# Patient Record
Sex: Female | Born: 1990 | Race: White | Hispanic: No | Marital: Single | Smoking: Former smoker
Health system: Southern US, Community
[De-identification: ages and names within clinical notes are randomized; demographics above are authoritative.]

## PROBLEM LIST (undated history)

## (undated) DIAGNOSIS — J302 Other seasonal allergic rhinitis: Secondary | ICD-10-CM

## (undated) DIAGNOSIS — N760 Acute vaginitis: Secondary | ICD-10-CM

## (undated) DIAGNOSIS — A749 Chlamydial infection, unspecified: Secondary | ICD-10-CM

## (undated) DIAGNOSIS — N390 Urinary tract infection, site not specified: Secondary | ICD-10-CM

## (undated) DIAGNOSIS — U071 COVID-19: Secondary | ICD-10-CM

## (undated) DIAGNOSIS — B9689 Other specified bacterial agents as the cause of diseases classified elsewhere: Secondary | ICD-10-CM

## (undated) DIAGNOSIS — N200 Calculus of kidney: Secondary | ICD-10-CM

## (undated) DIAGNOSIS — Z8489 Family history of other specified conditions: Secondary | ICD-10-CM

## (undated) DIAGNOSIS — Z309 Encounter for contraceptive management, unspecified: Secondary | ICD-10-CM

## (undated) DIAGNOSIS — N930 Postcoital and contact bleeding: Principal | ICD-10-CM

## (undated) DIAGNOSIS — Z87442 Personal history of urinary calculi: Secondary | ICD-10-CM

## (undated) DIAGNOSIS — N898 Other specified noninflammatory disorders of vagina: Secondary | ICD-10-CM

## (undated) DIAGNOSIS — F419 Anxiety disorder, unspecified: Secondary | ICD-10-CM

## (undated) HISTORY — DX: Other specified noninflammatory disorders of vagina: N89.8

## (undated) HISTORY — DX: Urinary tract infection, site not specified: N39.0

## (undated) HISTORY — DX: Acute vaginitis: N76.0

## (undated) HISTORY — PX: NO PAST SURGERIES: SHX2092

## (undated) HISTORY — DX: Chlamydial infection, unspecified: A74.9

## (undated) HISTORY — DX: Encounter for contraceptive management, unspecified: Z30.9

## (undated) HISTORY — DX: Postcoital and contact bleeding: N93.0

## (undated) HISTORY — DX: Other specified bacterial agents as the cause of diseases classified elsewhere: B96.89

---
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
## 2010-01-05 ENCOUNTER — Emergency Department (HOSPITAL_COMMUNITY): Admission: EM | Admit: 2010-01-05 | Discharge: 2010-01-05 | Payer: Self-pay | Admitting: Emergency Medicine

## 2010-01-05 DIAGNOSIS — N200 Calculus of kidney: Secondary | ICD-10-CM

## 2010-01-05 HISTORY — DX: Calculus of kidney: N20.0

## 2010-09-20 LAB — DIFFERENTIAL
Basophils Absolute: 0 10*3/uL (ref 0.0–0.1)
Eosinophils Relative: 0 % (ref 0–5)
Lymphocytes Relative: 27 % (ref 12–46)
Lymphs Abs: 1.8 10*3/uL (ref 0.7–4.0)
Monocytes Absolute: 0.4 10*3/uL (ref 0.1–1.0)
Neutro Abs: 4.4 10*3/uL (ref 1.7–7.7)

## 2010-09-20 LAB — URINALYSIS, ROUTINE W REFLEX MICROSCOPIC
Leukocytes, UA: NEGATIVE
Nitrite: POSITIVE — AB
Urobilinogen, UA: 1 mg/dL (ref 0.0–1.0)

## 2010-09-20 LAB — CBC
HCT: 38.8 % (ref 36.0–46.0)
Hemoglobin: 13.5 g/dL (ref 12.0–15.0)
MCH: 31.5 pg (ref 26.0–34.0)
MCV: 90.9 fL (ref 78.0–100.0)
Platelets: 256 10*3/uL (ref 150–400)
RDW: 11.8 % (ref 11.5–15.5)

## 2010-09-20 LAB — URINE CULTURE: Colony Count: NO GROWTH

## 2010-09-20 LAB — URINE MICROSCOPIC-ADD ON

## 2010-09-20 LAB — BASIC METABOLIC PANEL
Calcium: 9.1 mg/dL (ref 8.4–10.5)
Chloride: 103 mEq/L (ref 96–112)
Creatinine, Ser: 0.8 mg/dL (ref 0.4–1.2)
Glucose, Bld: 98 mg/dL (ref 70–99)
Potassium: 3.1 mEq/L — ABNORMAL LOW (ref 3.5–5.1)

## 2011-06-24 ENCOUNTER — Emergency Department (HOSPITAL_COMMUNITY)
Admission: EM | Admit: 2011-06-24 | Discharge: 2011-06-25 | Disposition: A | Discharge status: Home / Self Care | Payer: 59 | Attending: Emergency Medicine | Admitting: Emergency Medicine

## 2011-06-24 DIAGNOSIS — R05 Cough: Secondary | ICD-10-CM | POA: Insufficient documentation

## 2011-06-24 DIAGNOSIS — M545 Low back pain, unspecified: Secondary | ICD-10-CM | POA: Insufficient documentation

## 2011-06-24 DIAGNOSIS — R6883 Chills (without fever): Secondary | ICD-10-CM | POA: Insufficient documentation

## 2011-06-24 DIAGNOSIS — R109 Unspecified abdominal pain: Secondary | ICD-10-CM | POA: Insufficient documentation

## 2011-06-24 DIAGNOSIS — R110 Nausea: Secondary | ICD-10-CM | POA: Insufficient documentation

## 2011-06-24 DIAGNOSIS — R300 Dysuria: Secondary | ICD-10-CM | POA: Insufficient documentation

## 2011-06-24 DIAGNOSIS — X58XXXA Exposure to other specified factors, initial encounter: Secondary | ICD-10-CM | POA: Insufficient documentation

## 2011-06-24 DIAGNOSIS — R10819 Abdominal tenderness, unspecified site: Secondary | ICD-10-CM | POA: Insufficient documentation

## 2011-06-24 DIAGNOSIS — T148XXA Other injury of unspecified body region, initial encounter: Secondary | ICD-10-CM

## 2011-06-24 DIAGNOSIS — R509 Fever, unspecified: Secondary | ICD-10-CM | POA: Insufficient documentation

## 2011-06-24 DIAGNOSIS — R059 Cough, unspecified: Secondary | ICD-10-CM | POA: Insufficient documentation

## 2011-06-24 LAB — URINALYSIS, ROUTINE W REFLEX MICROSCOPIC
Hgb urine dipstick: NEGATIVE
Nitrite: NEGATIVE

## 2011-06-24 LAB — PREGNANCY, URINE: Preg Test, Ur: NEGATIVE

## 2011-06-24 NOTE — ED Notes (Signed)
Pain began last Friday, started in right hip area, now having right flank pain and right lower back pain, also +painful urination, ?fever at times

## 2011-06-24 NOTE — ED Provider Notes (Signed)
History     CSN: [id number]  Arrival date & time 06/24/11  2317   First MD Initiated Contact with Patient 06/24/11 2336      Chief Complaint  Patient presents with  . Flank Pain  . Back Pain    (Consider location/radiation/quality/duration/timing/severity/associated sxs/prior treatment) Patient is a 20 y.o. female presenting with flank pain and back pain. The history is provided by the patient and a parent. No language interpreter was used.  Flank Pain This is a new problem. Episode onset: 2-3 days ago  The problem occurs constantly. The problem has been unchanged. Associated symptoms include abdominal pain, chills, coughing, a fever and nausea. Pertinent negatives include no change in bowel habit, sore throat or vomiting. Associated symptoms comments: Had mild cough and fever at the end of last week but that has resolved.  . She has tried nothing for the symptoms.  Back Pain  Associated symptoms include a fever, abdominal pain and dysuria.    History reviewed. No pertinent past medical history.  History reviewed. No pertinent past surgical history.  History reviewed. No pertinent family history.  History  Substance Use Topics  . Smoking status: Never Smoker   . Smokeless tobacco: Not on file  . Alcohol Use: No    OB History    Grav Para Term Preterm Abortions TAB SAB Ect Mult Living                  Review of Systems  Constitutional: Positive for fever and chills.  HENT: Negative for sore throat.   Respiratory: Positive for cough.   Gastrointestinal: Positive for nausea and abdominal pain. Negative for vomiting and change in bowel habit.  Genitourinary: Positive for dysuria and flank pain. Negative for urgency, frequency, hematuria, vaginal bleeding, vaginal discharge and vaginal pain.  Musculoskeletal: Positive for back pain.  All other systems reviewed and are negative.    Allergies  Sulfa antibiotics  Home Medications  No current outpatient prescriptions  on file.  BP 149/88  Pulse 98  Temp(Src) 98.4 F (36.9 C) (Oral)  Resp 20  Ht 5\' 2"  (1.575 m)  Wt 118 lb (53.524 kg)  BMI 21.58 kg/m2  SpO2 100%  LMP 06/10/2011  Physical Exam  Nursing note and vitals reviewed. Constitutional: She is oriented to person, place, and time. She appears well-developed and well-nourished. She is cooperative.  Non-toxic appearance. She does not have a sickly appearance. She does not appear ill. No distress.  HENT:  Head: Normocephalic and atraumatic.  Right Ear: External ear normal.  Left Ear: External ear normal.  Nose: Nose normal.  Mouth/Throat: Uvula is midline, oropharynx is clear and moist and mucous membranes are normal. No uvula swelling. No oropharyngeal exudate, posterior oropharyngeal edema, posterior oropharyngeal erythema or tonsillar abscesses.  Eyes: EOM are normal.  Neck: Normal range of motion.  Cardiovascular: Normal rate, regular rhythm and normal heart sounds.   Pulmonary/Chest: Effort normal and breath sounds normal. No accessory muscle usage. Not tachypneic. No respiratory distress. She has no decreased breath sounds. She has no wheezes. She has no rhonchi. She has no rales.  Abdominal: Soft. She exhibits no distension and no mass. There is no hepatosplenomegaly. There is tenderness in the suprapubic area. There is CVA tenderness. There is no rigidity, no rebound, no guarding, no tenderness at McBurney's point and negative Murphy's sign.    Genitourinary: No vaginal discharge found.  Musculoskeletal:       Lumbar back: She exhibits decreased range of motion, tenderness and  pain. She exhibits no bony tenderness, no swelling, no edema, no deformity, no laceration, no spasm and normal pulse.       Back:  Lymphadenopathy:    She has no cervical adenopathy.  Neurological: She is alert and oriented to person, place, and time.  Skin: Skin is warm and dry. She is not diaphoretic.  Psychiatric: She has a normal mood and affect. Judgment  normal.    ED Course  Procedures (including critical care time)   Labs Reviewed  URINALYSIS, ROUTINE W REFLEX MICROSCOPIC  PREGNANCY, URINE   No results found.   No diagnosis found.    MDM   0035-discussed normal u/a with pt and mom.  No UTI.  Possible but unlikely kidney stone presentation.  They agree with watchful waiting and will return if  Fever increases, urinary sxs worsen or flank pain worsens.  Back and flank are tender to palpation c/w muscle strain; possibly from coughing.       Worthy Rancher, PA 06/25/11 [phone]

## 2011-06-25 MED ORDER — ONDANSETRON HCL 4 MG PO TABS: 4.0000 mg | ORAL_TABLET | Freq: Four times a day (QID) | ORAL | Status: AC

## 2011-06-25 MED ORDER — ONDANSETRON 4 MG PO TBDP
4.0000 mg | ORAL_TABLET | Freq: Once | ORAL | Status: AC
  Administered 2011-06-25: 4 mg via ORAL
  Filled 2011-06-25: qty 1

## 2011-06-25 NOTE — ED Provider Notes (Signed)
Medical screening examination/treatment/procedure(s) were performed by non-physician practitioner and as supervising physician I was immediately available for consultation/collaboration.  Nicoletta Dress. Colon Branch, MD 06/25/11 [id number]

## 2012-02-23 ENCOUNTER — Emergency Department (HOSPITAL_COMMUNITY): Payer: BC Managed Care – PPO

## 2012-02-23 ENCOUNTER — Encounter (HOSPITAL_COMMUNITY): Payer: Self-pay | Admitting: *Deleted

## 2012-02-23 ENCOUNTER — Emergency Department (HOSPITAL_COMMUNITY)
Admission: EM | Admit: 2012-02-23 | Discharge: 2012-02-23 | Disposition: A | Discharge status: Home / Self Care | Payer: BC Managed Care – PPO | Attending: Emergency Medicine | Admitting: Emergency Medicine

## 2012-02-23 ENCOUNTER — Other Ambulatory Visit (HOSPITAL_COMMUNITY): Payer: Self-pay | Admitting: General Surgery

## 2012-02-23 DIAGNOSIS — L049 Acute lymphadenitis, unspecified: Secondary | ICD-10-CM

## 2012-02-23 DIAGNOSIS — R109 Unspecified abdominal pain: Secondary | ICD-10-CM | POA: Insufficient documentation

## 2012-02-23 HISTORY — DX: Calculus of kidney: N20.0

## 2012-02-23 LAB — URINALYSIS, ROUTINE W REFLEX MICROSCOPIC
Bilirubin Urine: NEGATIVE
Glucose, UA: NEGATIVE mg/dL
Ketones, ur: 40 mg/dL — AB
Nitrite: NEGATIVE
Protein, ur: NEGATIVE mg/dL
Urobilinogen, UA: 0.2 mg/dL (ref 0.0–1.0)
pH: 6.5 (ref 5.0–8.0)

## 2012-02-23 LAB — CBC WITH DIFFERENTIAL/PLATELET
Basophils Relative: 1 % (ref 0–1)
Eosinophils Absolute: 0 10*3/uL (ref 0.0–0.7)
HCT: 36.3 % (ref 36.0–46.0)
Hemoglobin: 12.1 g/dL (ref 12.0–15.0)
MCH: 29.4 pg (ref 26.0–34.0)
Platelets: 392 10*3/uL (ref 150–400)
RBC: 4.11 MIL/uL (ref 3.87–5.11)
RDW: 11.9 % (ref 11.5–15.5)

## 2012-02-23 LAB — URINE MICROSCOPIC-ADD ON

## 2012-02-23 LAB — COMPREHENSIVE METABOLIC PANEL
ALT: 11 U/L (ref 0–35)
AST: 11 U/L (ref 0–37)
Albumin: 3.8 g/dL (ref 3.5–5.2)
Creatinine, Ser: 0.61 mg/dL (ref 0.50–1.10)
Glucose, Bld: 98 mg/dL (ref 70–99)
Total Bilirubin: 0.2 mg/dL — ABNORMAL LOW (ref 0.3–1.2)

## 2012-02-23 MED ORDER — MORPHINE SULFATE 4 MG/ML IJ SOLN
4.0000 mg | Freq: Once | INTRAMUSCULAR | Status: AC
  Administered 2012-02-23: 4 mg via INTRAVENOUS
  Filled 2012-02-23: qty 1

## 2012-02-23 MED ORDER — CEFTRIAXONE SODIUM 1 G IJ SOLR
1.0000 g | Freq: Once | INTRAMUSCULAR | Status: AC
  Administered 2012-02-23: 1 g via INTRAVENOUS
  Filled 2012-02-23: qty 10

## 2012-02-23 MED ORDER — IOHEXOL 300 MG/ML  SOLN
100.0000 mL | Freq: Once | INTRAMUSCULAR | Status: AC | PRN
Start: 2012-02-23 — End: 2012-02-23
  Administered 2012-02-23: 100 mL via INTRAVENOUS

## 2012-02-23 MED ORDER — OXYCODONE-ACETAMINOPHEN 5-325 MG PO TABS: 1.0000 | ORAL_TABLET | Freq: Four times a day (QID) | ORAL | Status: AC | PRN

## 2012-02-23 MED ORDER — SODIUM CHLORIDE 0.9 % IV SOLN
1000.0000 mL | Freq: Once | INTRAVENOUS | Status: AC
  Administered 2012-02-23: 500 mL via INTRAVENOUS

## 2012-02-23 MED ORDER — DOXYCYCLINE HYCLATE 100 MG PO TABS
100.0000 mg | ORAL_TABLET | Freq: Once | ORAL | Status: AC
  Administered 2012-02-23: 100 mg via ORAL
  Filled 2012-02-23: qty 1

## 2012-02-23 MED ORDER — DOXYCYCLINE HYCLATE 100 MG PO CAPS: 100.0000 mg | ORAL_CAPSULE | Freq: Two times a day (BID) | ORAL | Status: AC

## 2012-02-23 NOTE — ED Provider Notes (Signed)
History   This chart was scribed for Renee Hutching, MD by Melba Coon. The patient was seen in room [facility] and the patient's care was started at 6:54PM.    CSN: [id number]  Arrival date & time 02/23/12  1719   First MD Initiated Contact with Patient 02/23/12 1749      Chief Complaint  Patient presents with  . Groin Pain    (Consider location/radiation/quality/duration/timing/severity/associated sxs/prior treatment) The history is provided by the patient. No language interpreter was used.   Renee English is a 21 y.o. female who presents to the Emergency Department complaining of constant, moderate to severe, achy, burning, right groin pain with an onset 2 weeks ago. Pt noticed a knot in the area of pain around onset. Size of the knot has not grown since onset. Pt saw her PCP and was told to present to the ED for a possible hernia. LNMP: pt stated she was spotting today. No HA, fever, neck pain, sore throat, rash, back pain, CP, SOB, n/v/d, dysuria, or extremity pain, edema, weakness, numbness, or tingling. No known allergies. No other pertinent medical symptoms.  PCP: Dr. Malvin Johns  Past Medical History  Diagnosis Date  . Renal stones 01/05/2010    History reviewed. No pertinent past surgical history.  History reviewed. No pertinent family history.  History  Substance Use Topics  . Smoking status: Never Smoker   . Smokeless tobacco: Not on file  . Alcohol Use: No    OB History    Grav Para Term Preterm Abortions TAB SAB Ect Mult Living                  Review of Systems 10 Systems reviewed and all are negative for acute change except as noted in the HPI.   Allergies  Sesame oil; Shellfish allergy; and Sulfa antibiotics  Home Medications   Current Outpatient Rx  Name Route Sig Dispense Refill  . CETIRIZINE HCL 10 MG PO TABS Oral Take 10 mg by mouth daily.    Marland Kitchen DIPHENHYDRAMINE HCL 25 MG PO CAPS Oral Take 25 mg by mouth as needed.    . IBUPROFEN 200 MG PO  TABS Oral Take 200 mg by mouth every 6 (six) hours as needed.    Marland Kitchen LEVONORGEST-ETH ESTRAD 91-DAY 0.1-0.02 & 0.01 MG PO TABS Oral Take 1 tablet by mouth at bedtime.      BP 124/96  Pulse 95  Temp 98.3 F (36.8 C) (Oral)  Resp 23  Ht 5\' 2"  (1.575 m)  Wt 112 lb (50.803 kg)  BMI 20.49 kg/m2  SpO2 99%  LMP 12/08/2011  Physical Exam  Nursing note and vitals reviewed. Constitutional: She is oriented to person, place, and time. She appears well-developed and well-nourished.  HENT:  Head: Normocephalic and atraumatic.  Eyes: EOM are normal. Pupils are equal, round, and reactive to light.  Neck: Normal range of motion. Neck supple.  Cardiovascular: Normal rate, normal heart sounds and intact distal pulses.   Pulmonary/Chest: Effort normal and breath sounds normal.  Abdominal: Bowel sounds are normal. She exhibits no distension. There is no tenderness.  Genitourinary:       Right inguinal 4x2 cm area of firm, indurated, oblique mass.  Musculoskeletal: Normal range of motion. She exhibits no edema and no tenderness.  Neurological: She is alert and oriented to person, place, and time. She has normal strength. No cranial nerve deficit or sensory deficit.  Skin: Skin is warm and dry. No rash noted.  Psychiatric: She has  a normal mood and affect.    ED Course  Procedures (including critical care time)  DIAGNOSTIC STUDIES: Oxygen Saturation is 100% on room air, normal by my interpretation.    COORDINATION OF CARE:  6:59PM - abd CT, blood w/u, and UA will be ordered for the pt.   Labs Reviewed  CBC WITH DIFFERENTIAL - Abnormal; Notable for the following:    Neutro Abs 8.0 (*)     All other components within normal limits  COMPREHENSIVE METABOLIC PANEL - Abnormal; Notable for the following:    Potassium 3.4 (*)     Total Bilirubin 0.2 (*)     All other components within normal limits  URINALYSIS, ROUTINE W REFLEX MICROSCOPIC - Abnormal; Notable for the following:    Color, Urine  AMBER (*)  BIOCHEMICALS MAY BE AFFECTED BY COLOR   Hgb urine dipstick LARGE (*)     Ketones, ur 40 (*)     Leukocytes, UA TRACE (*)     All other components within normal limits  URINE MICROSCOPIC-ADD ON - Abnormal; Notable for the following:    Squamous Epithelial / LPF MANY (*)     Bacteria, UA FEW (*)     All other components within normal limits  PREGNANCY, URINE  URINE CULTURE   Ct Abdomen Pelvis W Contrast  02/23/2012  *RADIOLOGY REPORT*  Clinical Data: Burning pain in the right groin for 2 weeks.  CT ABDOMEN AND PELVIS WITH CONTRAST  Technique:  Multidetector CT imaging of the abdomen and pelvis was performed following the standard protocol during bolus administration of intravenous contrast.  Contrast: OMNIPAQUE IOHEXOL 300 MG/ML  SOLN  Comparison: 01/05/2010  Findings: The lung bases are clear.  The liver, spleen, gallbladder, pancreas, adrenal glands, kidneys, abdominal aorta, and retroperitoneal lymph nodes are unremarkable.  The stomach, small bowel, and colon are not abnormally distended.  No free air or free fluid in the abdomen.  No significant bowel wall thickening.  Pelvis:  In the right inguinal region, there is a heterogeneously enhancing mass with surrounding fat stranding associated with mildly enlarged enhancing lymph nodes.  The mass measures 2.2 x 3.2 x 3 cm.  There is central low attenuation suggesting fluid or necrosis.  This is new since the previous study.  This could represent an inflammatory lymph node with central abscess and cellulitis, and infected hematoma, or necrotic mass lesion.  The uterus and adnexal structures are not enlarged.  The bladder wall is not thickened.  No free or loculated pelvic fluid collections.  The appendix is normal.  Normal alignment of the lumbar vertebrae.  IMPRESSION: Inflammatory mass in the right inguinal region suggesting abscess, lymphadenitis, infected hematoma, or possibly necrotic mass lesion. Adjacent reactive lymph nodes are  present.   Original Report Authenticated By: Marlon Pel, M.D.      No diagnosis found.    MDM  History physical exam and CT scan consistent with inflammatory right inguinal lymphadenitis.  Discussed with Dr. Malvin Johns and family. Will start by mouth doxycycline and  will follow up in office. I personally performed the services described in this documentation, which was scribed in my presence. The recorded information has been reviewed and considered.         Renee Hutching, MD 02/23/12 2157

## 2012-02-23 NOTE — ED Notes (Signed)
Pt stable at discharge Pt and family informed and educated about the lymph system. Educational material supplied; Pt instructed not to drive while on pain medication and to finish all antibiotics as instructed

## 2012-02-23 NOTE — ED Notes (Signed)
Pt c/o knot in her right groin x 2 weeks. Pt seen by Dr. Malvin Johns and told to come to ED for possible hernia.

## 2012-02-24 ENCOUNTER — Other Ambulatory Visit (HOSPITAL_COMMUNITY): Payer: 59

## 2012-02-24 LAB — URINE CULTURE

## 2012-02-28 NOTE — Consult Note (Signed)
NAME:  BENEDETTA, SUNDSTROM NO.:  [id number]  MEDICAL RECORD NO.:  [id number]  LOCATION:  [facility]                         FACILITY:  [facility]  PHYSICIAN:  Barbaraann Barthel, M.D. DATE OF BIRTH:  [birth year]  DATE OF CONSULTATION:  02/28/2012 DATE OF DISCHARGE:  02/23/2012                                CONSULTATION   ADDRESS: Laverle Hobby, MD [address] [place], [place] [address].  BODY:  Dear Dr. Gabriel Rung:  Thank you kindly for sending Ms. Merrics my way.  I wanted to give you some followup on her.  I saw her in my office after you sent her to me on February 23, 2012.  Clinically, she had what appeared to be a groin mass possibly likely an enlarged lymph node versus an incarcerated left inguinal hernia.  I suspected more of an inflammatory process clinically because of its location and history.  At any rate, this was confirmed by CT scan and I placed her on antibiotics and will follow up regarding this.  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  She also later mentioned that she had an insect bite on her right hip as well.  She did not have any history of any rashes or any GI symptoms or fever and history of any night sweats or fatigue.  Clinically, I did not find any other lymphadenopathy that was suspicious.  Because of the history of insect bite, I went ahead and treated her with doxycycline and will follow up with that and will keep you informed as to her surgical progress.  Again, I thank you as always for your confidence in sending her my way.     Barbaraann Barthel, M.D.     WB/MEDQ  D:  02/28/2012  T:  02/28/2012  Job:  [id number]

## 2012-06-22 ENCOUNTER — Other Ambulatory Visit: Payer: Self-pay | Admitting: Adult Health

## 2012-06-22 ENCOUNTER — Other Ambulatory Visit (HOSPITAL_COMMUNITY)
Admission: RE | Admit: 2012-06-22 | Discharge: 2012-06-22 | Disposition: A | Discharge status: Home / Self Care | Payer: BC Managed Care – PPO | Source: Ambulatory Visit | Attending: Obstetrics and Gynecology | Admitting: Obstetrics and Gynecology

## 2012-06-22 DIAGNOSIS — Z01419 Encounter for gynecological examination (general) (routine) without abnormal findings: Secondary | ICD-10-CM | POA: Insufficient documentation

## 2013-01-04 ENCOUNTER — Telehealth: Payer: Self-pay | Admitting: Adult Health

## 2013-01-04 NOTE — Telephone Encounter (Signed)
Bleeding with sex, stops after sex, make appt to be seen

## 2013-01-04 NOTE — Telephone Encounter (Signed)
Left message to call back  

## 2013-01-10 ENCOUNTER — Ambulatory Visit (INDEPENDENT_AMBULATORY_CARE_PROVIDER_SITE_OTHER): Payer: BC Managed Care – PPO | Admitting: Adult Health

## 2013-01-10 ENCOUNTER — Encounter: Payer: Self-pay | Admitting: Adult Health

## 2013-01-10 VITALS — BP 120/80 | Ht 61.0 in | Wt 118.0 lb

## 2013-01-10 DIAGNOSIS — B9689 Other specified bacterial agents as the cause of diseases classified elsewhere: Secondary | ICD-10-CM

## 2013-01-10 DIAGNOSIS — N760 Acute vaginitis: Secondary | ICD-10-CM

## 2013-01-10 DIAGNOSIS — A499 Bacterial infection, unspecified: Secondary | ICD-10-CM

## 2013-01-10 DIAGNOSIS — N930 Postcoital and contact bleeding: Secondary | ICD-10-CM

## 2013-01-10 DIAGNOSIS — R809 Proteinuria, unspecified: Secondary | ICD-10-CM

## 2013-01-10 HISTORY — DX: Postcoital and contact bleeding: N93.0

## 2013-01-10 HISTORY — DX: Other specified bacterial agents as the cause of diseases classified elsewhere: B96.89

## 2013-01-10 LAB — POCT URINALYSIS DIPSTICK
Blood, UA: NEGATIVE
Glucose, UA: NEGATIVE
Leukocytes, UA: NEGATIVE
Nitrite, UA: NEGATIVE

## 2013-01-10 LAB — POCT WET PREP (WET MOUNT): Trichomonas Wet Prep HPF POC: NEGATIVE

## 2013-01-10 MED ORDER — METRONIDAZOLE 0.75 % VA GEL: VAGINAL | Status: DC

## 2013-01-10 NOTE — Patient Instructions (Addendum)
Bacterial Vaginosis Bacterial vaginosis (BV) is a vaginal infection where the normal balance of bacteria in the vagina is disrupted. The normal balance is then replaced by an overgrowth of certain bacteria. There are several different kinds of bacteria that can cause BV. BV is the most common vaginal infection in women of childbearing age. CAUSES   The cause of BV is not fully understood. BV develops when there is an increase or imbalance of harmful bacteria.  Some activities or behaviors can upset the normal balance of bacteria in the vagina and put women at increased risk including:  Having a new sex partner or multiple sex partners.  Douching.  Using an intrauterine device (IUD) for contraception.  It is not clear what role sexual activity plays in the development of BV. However, women that have never had sexual intercourse are rarely infected with BV. Women do not get BV from toilet seats, bedding, swimming pools or from touching objects around them.  SYMPTOMS   Grey vaginal discharge.  A fish-like odor with discharge, especially after sexual intercourse.  Itching or burning of the vagina and vulva.  Burning or pain with urination.  Some women have no signs or symptoms at all. DIAGNOSIS  Your caregiver must examine the vagina for signs of BV. Your caregiver will perform lab tests and look at the sample of vaginal fluid through a microscope. They will look for bacteria and abnormal cells (clue cells), a pH test higher than 4.5, and a positive amine test all associated with BV.  RISKS AND COMPLICATIONS   Pelvic inflammatory disease (PID).  Infections following gynecology surgery.  Developing HIV.  Developing herpes virus. TREATMENT  Sometimes BV will clear up without treatment. However, all women with symptoms of BV should be treated to avoid complications, especially if gynecology surgery is planned. Female partners generally do not need to be treated. However, BV may spread  between female sex partners so treatment is helpful in preventing a recurrence of BV.   BV may be treated with antibiotics. The antibiotics come in either pill or vaginal cream forms. Either can be used with nonpregnant or pregnant women, but the recommended dosages differ. These antibiotics are not harmful to the baby.  BV can recur after treatment. If this happens, a second round of antibiotics will often be prescribed.  Treatment is important for pregnant women. If not treated, BV can cause a premature delivery, especially for a pregnant woman who had a premature birth in the past. All pregnant women who have symptoms of BV should be checked and treated.  For chronic reoccurrence of BV, treatment with a type of prescribed gel vaginally twice a week is helpful. HOME CARE INSTRUCTIONS   Finish all medication as directed by your caregiver.  Do not have sex until treatment is completed.  Tell your sexual partner that you have a vaginal infection. They should see their caregiver and be treated if they have problems, such as a mild rash or itching.  Practice safe sex. Use condoms. Only have 1 sex partner. PREVENTION  Basic prevention steps can help reduce the risk of upsetting the natural balance of bacteria in the vagina and developing BV:  Do not have sexual intercourse (be abstinent).  Do not douche.  Use all of the medicine prescribed for treatment of BV, even if the signs and symptoms go away.  Tell your sex partner if you have BV. That way, they can be treated, if needed, to prevent reoccurrence. SEEK MEDICAL CARE IF:     Your symptoms are not improving after 3 days of treatment.  You have increased discharge, pain, or fever. MAKE SURE YOU:   Understand these instructions.  Will watch your condition.  Will get help right away if you are not doing well or get worse. FOR MORE INFORMATION  Division of STD Prevention (DSTDP), Centers for Disease Control and Prevention:  SolutionApps.[employer].za American Social Health Association (ASHA): www.ashastd.org  Document Released: 06/21/2005 Document Revised: 09/13/2011 Document Reviewed: 12/12/2008 [facility] Patient Information 2014 [place], [place]. Use Metrogel Follow up prn

## 2013-01-10 NOTE — Progress Notes (Signed)
Subjective:     Patient ID: Abran Cantor, female   DOB: 12-19-1990, 22 y.o.   MRN: [id number]  HPI Renee English is a 22 year old white female in complaining of bleeding after sex, and a recent UTI treated at [facility].She has had no pain with the bleeding.  Review of Systems Positives in HPI Reviewed past medical,surgical, social and family history. Reviewed medications and allergies.      Objective:   Physical Exam BP 120/80  Ht 5\' 1"  (1.549 m)  Wt 118 lb (53.524 kg)  BMI 22.31 kg/m2  LMP 12/05/2012   Urine dipstick 2+ proteinuria, Skin warm and dry.Pelvic: external genitalia is normal in appearance, vagina: white discharge without odor, cervix is irritated and friable, no CMT, uterus: normal size, shape and contour, non tender, no masses felt, adnexa: no masses or tenderness noted. Wet prep: + for clue cells and +WBCs. GC/CHL obtained.  Assessment:      Postcoital bleeding BV Proteinuria    Plan:     UA C&S Rx Metrogel 1 applicator at hs x 5 nights in vagina Review handout on BV

## 2013-01-11 LAB — URINALYSIS
Bilirubin Urine: NEGATIVE
Hgb urine dipstick: NEGATIVE
Ketones, ur: NEGATIVE mg/dL
Protein, ur: NEGATIVE mg/dL
Urobilinogen, UA: 0.2 mg/dL (ref 0.0–1.0)

## 2013-01-12 ENCOUNTER — Telehealth: Payer: Self-pay | Admitting: Adult Health

## 2013-01-12 LAB — URINE CULTURE: Colony Count: NO GROWTH

## 2013-01-12 NOTE — Telephone Encounter (Signed)
Left message to call on Monday.

## 2013-01-15 ENCOUNTER — Telehealth: Payer: Self-pay | Admitting: Adult Health

## 2013-01-15 MED ORDER — AZITHROMYCIN 500 MG PO TABS: ORAL_TABLET | ORAL | Status: DC

## 2013-01-15 NOTE — Telephone Encounter (Signed)
Pt aware labs +chlamydia will treat her and partner with azithromycin 500 ng #2 po now and do POC in 4 weeks, [facility] sent.

## 2013-02-12 ENCOUNTER — Ambulatory Visit (INDEPENDENT_AMBULATORY_CARE_PROVIDER_SITE_OTHER): Payer: BC Managed Care – PPO | Admitting: Adult Health

## 2013-02-12 ENCOUNTER — Encounter: Payer: Self-pay | Admitting: Adult Health

## 2013-02-12 VITALS — BP 110/70 | Ht 61.0 in | Wt 118.0 lb

## 2013-02-12 DIAGNOSIS — Z8619 Personal history of other infectious and parasitic diseases: Secondary | ICD-10-CM

## 2013-02-12 DIAGNOSIS — A7489 Other chlamydial diseases: Secondary | ICD-10-CM

## 2013-02-12 NOTE — Progress Notes (Signed)
Subjective:     Patient ID: Renee English, female   DOB: January 19, 1991, 22 y.o.   MRN: [id number]  HPI Renee English is in for proof of treatment of recent chlamydia infection, that caused post coital bleeding which resolved after taking antibiotics. No complaints today.  Review of Systems See HPI Reviewed past medical,surgical, social and family history. Reviewed medications and allergies.     Objective:   Physical Exam BP 110/70  Ht 5\' 1"  (1.549 m)  Wt 118 lb (53.524 kg)  BMI 22.31 kg/m2  LMP 12/11/2012   She and her partner took meds, will send urine for GC/CHL Assessment:     Proof of treatment for recent chlamydia infection    Plan:     Check GC/CHL Follow up labs in 24-48 hours

## 2013-02-12 NOTE — Patient Instructions (Addendum)
Will follow up prnChlamydia, Female Chlamydia is an infection caused by bacteria. It is spread through sexual contact. Chlamydia can be in different areas of the body. These areas include the cervix, urethra, throat, or rectum. If you are infected, you must finish all treatments and follow up with a caregiver.  CAUSES  Chlamydia is a sexually transmitted disease. It is passed from an infected partner during intimate contact. This contact could be with the genitals, mouth, or rectal area. Infections can also be passed from mothers to babies during birth. SYMPTOMS  There may not be any symptoms. This is often the case early in the infection. Symptoms you may notice include:  Mild pain and discomfort when urinating.  Inflammation of the rectum.  Vaginal discharge.  Painful intercourse.  Abdominal pain.  Bleeding between menstrual periods. DIAGNOSIS  To diagnose this infection, your caregiver will do a pelvic exam. Cultures will be taken of the vagina, cervix, urine, and possibly the rectum to see if the infection is chlamydia. TREATMENT You will be given antibiotic medicines. Any sexual partners should also be treated, even if they do not show symptoms. Take the medicine for the prescribed length of time. If you are pregnant, do not take tetracycline-type antibiotics. HOME CARE INSTRUCTIONS   Take your antibiotics as directed. Finish them even if you start to feel better.  Only take over-the-counter or prescription medicines for pain, discomfort, or fever as directed by your caregiver.  Inform any sexual partners about the infection. They should be treated also.  Do not have sexual contact until your caregiver tells you it is okay.  Get plenty of rest.  Eat a well-balanced diet, and drink enough fluids to keep your urine clear or pale yellow.  Keep all follow-up appointments and tests. SEEK IMMEDIATE MEDICAL CARE IF:   Your symptoms return.  You have a fever. MAKE SURE YOU:    Understand these instructions.  Will watch your condition.  Will get help right away if you are not doing well or get worse. Document Released: 03/31/2005 Document Revised: 09/13/2011 Document Reviewed: 02/07/2008 [facility] Patient Information 2014 [place], [place].

## 2013-02-13 ENCOUNTER — Telehealth: Payer: Self-pay | Admitting: Adult Health

## 2013-02-13 LAB — GC/CHLAMYDIA PROBE AMP: GC Probe RNA: NEGATIVE

## 2013-02-13 NOTE — Telephone Encounter (Signed)
Pt aware labs negative.  

## 2013-08-28 ENCOUNTER — Other Ambulatory Visit: Payer: Self-pay | Admitting: Adult Health

## 2013-10-18 ENCOUNTER — Encounter: Payer: Self-pay | Admitting: Adult Health

## 2013-10-18 ENCOUNTER — Ambulatory Visit (INDEPENDENT_AMBULATORY_CARE_PROVIDER_SITE_OTHER): Payer: BC Managed Care – PPO | Admitting: Adult Health

## 2013-10-18 ENCOUNTER — Other Ambulatory Visit (HOSPITAL_COMMUNITY)
Admission: RE | Admit: 2013-10-18 | Discharge: 2013-10-18 | Disposition: A | Discharge status: Home / Self Care | Payer: BC Managed Care – PPO | Source: Ambulatory Visit | Attending: Adult Health | Admitting: Adult Health

## 2013-10-18 VITALS — BP 120/78 | HR 74 | Ht 62.0 in | Wt 126.0 lb

## 2013-10-18 DIAGNOSIS — N899 Noninflammatory disorder of vagina, unspecified: Secondary | ICD-10-CM

## 2013-10-18 DIAGNOSIS — Z01419 Encounter for gynecological examination (general) (routine) without abnormal findings: Secondary | ICD-10-CM

## 2013-10-18 DIAGNOSIS — N898 Other specified noninflammatory disorders of vagina: Secondary | ICD-10-CM

## 2013-10-18 DIAGNOSIS — Z309 Encounter for contraceptive management, unspecified: Secondary | ICD-10-CM

## 2013-10-18 DIAGNOSIS — Z113 Encounter for screening for infections with a predominantly sexual mode of transmission: Secondary | ICD-10-CM | POA: Insufficient documentation

## 2013-10-18 HISTORY — DX: Encounter for contraceptive management, unspecified: Z30.9

## 2013-10-18 HISTORY — DX: Other specified noninflammatory disorders of vagina: N89.8

## 2013-10-18 LAB — POCT WET PREP (WET MOUNT): WBC WET PREP: NEGATIVE

## 2013-10-18 MED ORDER — LEVONORGEST-ETH ESTRAD 91-DAY 0.1-0.02 & 0.01 MG PO TABS: ORAL_TABLET | ORAL | Status: DC

## 2013-10-18 NOTE — Progress Notes (Signed)
Patient ID: Renee CantorMegan A English, female   DOB: 16-May-1991, 23 y.o.   MRN: [id number] History of Present Illness: Renee English is a 23 year old white female, single in for a pap and physical.She is happy with her pills and has had some vaginal discharge and burning and use OTC monistat and AZO.   Current Medications, Allergies, Past Medical History, Past Surgical History, Family History and Social History were reviewed in [facility] electronic medical record.     Review of Systems: Patient denies any headaches, blurred vision, shortness of breath, chest pain, abdominal pain, problems with bowel movements, urination, or intercourse. No joint pain or mood swings, see HPI.    Physical Exam:BP 120/78  Pulse 74  Ht 5\' 2"  (1.575 m)  Wt 126 lb (57.153 kg)  BMI 23.04 kg/m2  LMP 09/06/2013 General:  Well developed, well nourished, no acute distress Skin:  Warm and dry, tan Neck:  Midline trachea, normal thyroid Lungs; Clear to auscultation bilaterally Breast:  No dominant palpable mass, retraction, or nipple discharge Cardiovascular: Regular rate and rhythm Abdomen:  Soft, non tender, no hepatosplenomegaly Pelvic:  External genitalia is normal in appearance.  The vagina is normal in appearance, scant discharge.The cervix is irritated at os, pap with GC/CHL  Uterus is felt to be normal size, shape, and contour.  No  adnexal masses or tenderness noted. Wet prep was negative. Extremities:  No swelling or varicosities noted Psych:  No mood changes, alert and cooperative, seems happy, but just had relationship end.   Impression:  Yearly gyn exam  Contraceptive management Vaginal irritation    Plan: Physical in 1 year Pap in 2 years Refilled amethia x 3 Use condoms Call prn

## 2013-10-18 NOTE — Patient Instructions (Signed)
Physical in 1 year Pap in 2  Continue OCs

## 2014-10-23 ENCOUNTER — Other Ambulatory Visit: Payer: Self-pay | Admitting: Adult Health

## 2014-10-29 ENCOUNTER — Other Ambulatory Visit: Payer: Self-pay | Admitting: *Deleted

## 2014-10-29 MED ORDER — LEVONORGEST-ETH ESTRAD 91-DAY 0.1-0.02 & 0.01 MG PO TABS
ORAL_TABLET | ORAL | Status: DC
Start: 2014-10-29 — End: 2015-05-22

## 2014-11-03 ENCOUNTER — Emergency Department (HOSPITAL_COMMUNITY)
Admission: EM | Admit: 2014-11-03 | Discharge: 2014-11-03 | Disposition: A | Discharge status: Home / Self Care | Payer: Self-pay | Attending: Emergency Medicine | Admitting: Emergency Medicine

## 2014-11-03 ENCOUNTER — Emergency Department (HOSPITAL_COMMUNITY): Payer: Self-pay

## 2014-11-03 ENCOUNTER — Encounter (HOSPITAL_COMMUNITY): Payer: Self-pay | Admitting: Emergency Medicine

## 2014-11-03 DIAGNOSIS — Y9301 Activity, walking, marching and hiking: Secondary | ICD-10-CM | POA: Insufficient documentation

## 2014-11-03 DIAGNOSIS — Z8619 Personal history of other infectious and parasitic diseases: Secondary | ICD-10-CM | POA: Insufficient documentation

## 2014-11-03 DIAGNOSIS — W108XXA Fall (on) (from) other stairs and steps, initial encounter: Secondary | ICD-10-CM | POA: Insufficient documentation

## 2014-11-03 DIAGNOSIS — Z8742 Personal history of other diseases of the female genital tract: Secondary | ICD-10-CM | POA: Insufficient documentation

## 2014-11-03 DIAGNOSIS — Z872 Personal history of diseases of the skin and subcutaneous tissue: Secondary | ICD-10-CM | POA: Insufficient documentation

## 2014-11-03 DIAGNOSIS — Z880 Allergy status to penicillin: Secondary | ICD-10-CM | POA: Insufficient documentation

## 2014-11-03 DIAGNOSIS — Z87442 Personal history of urinary calculi: Secondary | ICD-10-CM | POA: Insufficient documentation

## 2014-11-03 DIAGNOSIS — Y998 Other external cause status: Secondary | ICD-10-CM | POA: Insufficient documentation

## 2014-11-03 DIAGNOSIS — S0990XA Unspecified injury of head, initial encounter: Secondary | ICD-10-CM | POA: Insufficient documentation

## 2014-11-03 DIAGNOSIS — W01198A Fall on same level from slipping, tripping and stumbling with subsequent striking against other object, initial encounter: Secondary | ICD-10-CM | POA: Insufficient documentation

## 2014-11-03 DIAGNOSIS — Y9289 Other specified places as the place of occurrence of the external cause: Secondary | ICD-10-CM | POA: Insufficient documentation

## 2014-11-03 MED ORDER — ACETAMINOPHEN 500 MG PO TABS
1000.0000 mg | ORAL_TABLET | Freq: Once | ORAL | Status: AC
  Administered 2014-11-03: 1000 mg via ORAL
  Filled 2014-11-03: qty 2

## 2014-11-03 MED ORDER — IBUPROFEN 400 MG PO TABS
400.0000 mg | ORAL_TABLET | Freq: Once | ORAL | Status: AC
  Administered 2014-11-03: 400 mg via ORAL
  Filled 2014-11-03: qty 1

## 2014-11-03 NOTE — ED Provider Notes (Signed)
CSN: [id number]     Arrival date & time 11/03/14  1129 History   First MD Initiated Contact with Patient 11/03/14 1309     Chief Complaint  Patient presents with  . Fall      HPI  Pt was seen at 1315. Per pt, c/o sudden onset and resolution of one episode of slip and fall 2 days ago. Pt states she hit her head on a stone step. Unsure regarding LOC. Since the fall, pt c/o intermittent headaches, "dizziness," confusion and "memory is foggy." Denies N/V, no neck or back pain, no visual changes, no focal motor weakness, no tingling/numbness in extremities, no CP/SOB, no abd pain.    PMD: Robbie Lis  Past Medical History  Diagnosis Date  . Renal stones 01/05/2010  . UTI (urinary tract infection)   . BV (bacterial vaginosis) 01/10/2013  . Postcoital bleeding 01/10/2013  . Chlamydia   . Contraceptive management 10/18/2013  . Vaginal irritation 10/18/2013   History reviewed. No pertinent past surgical history.   Family History  Problem Relation Age of Onset  . Diabetes Maternal Grandmother   . Hypertension Maternal Grandmother   . Cancer Maternal Grandmother     breast  . Hypertension Maternal Grandfather   . Diabetes Maternal Grandfather   . Cancer Maternal Aunt     breast   History  Substance Use Topics  . Smoking status: Never Smoker   . Smokeless tobacco: Never Used  . Alcohol Use: No     Comment: occ.    Review of Systems ROS: Statement: All systems negative except as marked or noted in the HPI; Constitutional: Negative for fever and chills. ; ; Eyes: Negative for eye pain, redness and discharge. ; ; ENMT: Negative for ear pain, hoarseness, nasal congestion, sinus pressure and sore throat. ; ; Cardiovascular: Negative for chest pain, palpitations, diaphoresis, dyspnea and peripheral edema. ; ; Respiratory: Negative for cough, wheezing and stridor. ; ; Gastrointestinal: Negative for nausea, vomiting, diarrhea, abdominal pain, blood in stool, hematemesis, jaundice and rectal bleeding.  . ; ; Genitourinary: Negative for dysuria, flank pain and hematuria. ; ; Musculoskeletal: +head injury. Negative for back pain and neck pain. Negative for swelling and deformity.; ; Skin: Negative for pruritus, rash, abrasions, blisters, bruising and skin lesion.; ; Neuro:  +"dizziness," headache. Negative for lightheadedness and neck stiffness. Negative for weakness, extremity weakness, paresthesias, involuntary movement, seizure and syncope.      Allergies  Penicillins; Sesame oil; Shellfish allergy; and Sulfa antibiotics  Home Medications   Prior to Admission medications   Medication Sig Start Date End Date Taking? Authorizing Provider  diphenhydrAMINE (BENADRYL) 25 mg capsule Take 25 mg by mouth as needed.   Yes Historical Provider, MD  ibuprofen (ADVIL,MOTRIN) 200 MG tablet Take 200 mg by mouth every 6 (six) hours as needed.   Yes Historical Provider, MD  Levonorgestrel-Ethinyl Estradiol (AMETHIA LO) 0.1-0.02 & 0.01 MG tablet TAKE 1 TABLET BY MOUTH EVERY DAY Patient not taking: Reported on 11/03/2014 10/29/14   Adline Potter, NP   BP 125/75 mmHg  Pulse 92  Temp(Src) 98.2 F (36.8 C) (Oral)  Resp 18  Ht  (1.549 m)  Wt 109 lb (49.442 kg)  BMI 20.61 kg/m2  SpO2 99%  LMP 09/04/2014 Physical Exam  1320: Physical examination:  Nursing notes reviewed; Vital signs and O2 SAT reviewed;  Constitutional: Well developed, Well nourished, Well hydrated, In no acute distress; Head:  Normocephalic, atraumatic; Eyes: EOMI, PERRL, No scleral icterus; ENMT: TM's clear bilat.  Mouth and pharynx normal, Mucous membranes moist; Neck: Supple, Full range of motion, No lymphadenopathy; Cardiovascular: Regular rate and rhythm, No murmur, rub, or gallop; Respiratory: Breath sounds clear & equal bilaterally, No rales, rhonchi, wheezes.  Speaking full sentences with ease, Normal respiratory effort/excursion; Chest: Nontender, Movement normal; Abdomen: Soft, Nontender, Nondistended, Normal bowel sounds;  Genitourinary: No CVA tenderness; Extremities: Pulses normal, No tenderness, No edema, No calf edema or asymmetry.; Neuro: AA&Ox3, Major CN grossly intact. No facial droop. Speech clear. No gross focal motor or sensory deficits in extremities. Climbs on and off stretcher easily by herself. Gait steady.; Skin: Color normal, Warm, Dry.   ED Course  Procedures     EKG Interpretation None      MDM  MDM Reviewed: previous chart, nursing note and vitals Interpretation: CT scan     Ct Head Wo Contrast 11/03/2014   CLINICAL DATA:  Fall down steps, head injury, intermittent headache, dizziness  EXAM: CT HEAD WITHOUT CONTRAST  TECHNIQUE: Contiguous axial images were obtained from the base of the skull through the vertex without intravenous contrast.  COMPARISON:  None.  FINDINGS: No evidence of parenchymal hemorrhage or extra-axial fluid collection. No mass lesion, mass effect, or midline shift.  No CT evidence of acute infarction.  Cerebral volume is within normal limits.  No ventriculomegaly.  The visualized paranasal sinuses are essentially clear. The mastoid air cells are unopacified.  No evidence of calvarial fracture.  IMPRESSION: Normal head CT.   Electronically Signed   By: Charline BillsSriyesh  Krishnan M.D.   On: 11/03/2014 14:08    1500:  CT reassuring. Tx symptomatically at this time. Dx and testing d/w pt and family.  Questions answered.  Verb understanding, agreeable to d/c home with outpt f/u.   Samuel JesterKathleen Paytan Recine, DO 11/06/14 [phone]

## 2014-11-03 NOTE — Discharge Instructions (Signed)
°Emergency Department Resource Guide °1) Find a Doctor and Pay Out of Pocket °Although you won't have to find out who is covered by your insurance plan, it is a good idea to ask around and get recommendations. You will then need to call the office and see if the doctor you have chosen will accept you as a new patient and what types of options they offer for patients who are self-pay. Some doctors offer discounts or will set up payment plans for their patients who do not have insurance, but you will need to ask so you aren't surprised when you get to your appointment. ° °2) Contact Your Local Health Department °Not all health departments have doctors that can see patients for sick visits, but many do, so it is worth a call to see if yours does. If you don't know where your local health department is, you can check in your phone book. The CDC also has a tool to help you locate your state's health department, and many state websites also have listings of all of their local health departments. ° °3) Find a Walk-in Clinic °If your illness is not likely to be very severe or complicated, you may want to try a walk in clinic. These are popping up all over the country in pharmacies, drugstores, and shopping centers. They're usually staffed by nurse practitioners or physician assistants that have been trained to treat common illnesses and complaints. They're usually fairly quick and inexpensive. However, if you have serious medical issues or chronic medical problems, these are probably not your best option. ° °No Primary Care Doctor: °- Call [facility] at  832-8000 - they can help you locate a primary care doctor that  accepts your insurance, provides certain services, etc. °- Physician Referral Service- 1-800-533-3463 ° °Chronic Pain Problems: °Organization         Address  Phone   Notes  °[facility]  (336) 297-2271 Patients need to be referred by their primary care doctor.  ° °Medication  Assistance: °Organization         Address  Phone   Notes  °[place] [facility] [address] °[place], [place] [address] (336) 641-8030 --Must be a resident of [place] °-- Must have NO insurance coverage whatsoever (no Medicaid/ Medicare, etc.) °-- The pt. MUST have a primary care doctor that directs their care regularly and follows them in the community °  °[facility]  (866) 331-1348   °[employer]  (888) 892-1162   ° °Agencies that provide inexpensive medical care: °Organization         Address  Phone   Notes  °[facility]  (336) 832-8035   °[facility]    (336) 832-7272   °[facility] [address] °[place], [place] [address] (336) 832-4777   °[facility] [address], °[place] (336) 271-4999   °[facility]    (336) 373-0678   °[facility]    (336) 272-1050   °[facility] ° [address], [place] Phone:  (336) 832-4444, Fax:  (336) 832-4440 Hours of Operation:  9 am - 6 pm, M-F.  Also accepts Medicaid/Medicare and self-pay.  °[facility] ° [address], [place] Phone: (336) 832-3150, Fax: (336) 832-3151. Hours of Operation:  8:30 am - 5:30 pm, M-F.  Also accepts Medicaid and self-pay.  °[facility] [place] [address], [place] Phone: (336) 878-6027   °[facility] [address], [place], [place] (336)723-1848, Ext. 123 Mondays & Thursdays: 7-9 AM.  First 15 patients are seen on a first come, first serve basis. °  ° °Medicaid-accepting [place] Providers: ° °Organization         Address  Phone   Notes  °[facility] [address], [place] (336) 641-2100 Also accepts self-pay patients.  °[facility] [address], [place] ° (336) 856-9996   °[facility] [address], [place]  (336) 288-8857   °[facility] [address] [place] [address], [place] (336) 299-7000   °Veita Bland [address], [place]  ° (336) 373-1557 Only accepts [place] Access Medicaid patients after they have their name applied to their card.  ° °Self-Pay (no insurance) in [place]: ° °Organization         Address  Phone   Notes  °Sickle Cell Patients, [facility] [address], [place] (336) 832-1970   °[facility] [address], [place] (336) 832-4400   °[facility] ° [address], [place] (336) 992-4800   °[facility]/Dr. Osei-Bonsu ° [address] [place] [address], [place] or [address], [place] (336) 841-8500 Phone number for both [place] and [place] locations is the same.  °[facility] [address], [place] (336) 299-0000   °[facility] [address] [place] [address], [place] or [address] (336) 852-7530 °(336) 878-2260   °[facility] [address], [place] (336) 350-1642, phone; (336) 294-5005, fax Sees patients 1st and 3rd Saturday of every month.  Must not qualify for public or private insurance (i.e. Medicaid, Medicare, [place] Health Choice, Veterans' Benefits) • Household income should be no more than 200% of the poverty level •The clinic cannot treat you if you are pregnant or think you are pregnant • Sexually transmitted diseases are not treated at the clinic.  ° ° °Dental Care: °Organization         Address  Phone  Notes  °[place] [facility] [address], [place] (336) 641-6152 Accepts children up to age 21 who are enrolled in Medicaid or [place] Health Choice; pregnant women with a Medicaid card; and children who have applied for Medicaid or [place] Health Choice, but were declined, whose parents can pay a reduced fee at time of service.  °[place]  [facility] [place]  [address], [place] (336) 641-7733 Accepts children up to age 21 who are enrolled in Medicaid or [place] Health Choice; pregnant women with a Medicaid card; and children who have applied for Medicaid or [place] Health Choice, but were declined, whose parents can pay a reduced fee at time of service.  °[facility] ° [address], [place] (336) 641-4533 Patients are seen by appointment only. Walk-ins are not accepted. [facility] will see patients 18 years of age and older. °Monday - Tuesday (8am-5pm) °Most Wednesdays (8:30-5pm) °$30 per visit, cash only  °[facility] ° [address], [place] (336) 641-4533 Patients are seen by appointment only. Walk-ins are not accepted. [facility] will see patients 18 years of age and older. °One   Wednesday Evening (Monthly: Volunteer Based).  $30 per visit, cash only  °[facility]  (919) 537-3737 for adults; Children under age 4, call Graduate Pediatric Dentistry at (919) [phone]. Children aged 4-14, please call (919) [phone] to request a pediatric application. ° Dental services are provided in all areas of dental care including fillings, crowns and bridges, complete and partial dentures, implants, gum treatment, root canals, and extractions. Preventive care is also provided. Treatment is provided to both adults and children. °Patients are selected via a lottery and there is often a waiting list. °  °[facility] [address], °[place] ° (336) 763-8833 [email or web address] °  °[facility] [address], [place], [place] (336)723-1848, Ext. 123 Second and Fourth Thursday of each month, opens at 6:30 AM; Clinic ends at 9 AM.  Patients are seen on a first-come first-served basis, and a limited number are seen during each clinic.  ° °[facility] ° [address], [place], [place] (336) 723-7904    Eligibility Requirements °You must have lived in [place], [place], or [place] counties for at least the last three months. °  You cannot be eligible for state or federal sponsored healthcare insurance, including Veterans Administration, Medicaid, or Medicare. °  You generally cannot be eligible for healthcare insurance through your employer.  °  How to apply: °Eligibility screenings are held every Tuesday and Wednesday afternoon from 1:00 pm until 4:00 pm. You do not need an appointment for the interview!  °[facility] [address], [place], [place] 336-631-2330   °[facility] [facility]  336-342-8273   °[facility]  336-703-3100   °[facility]  336-570-6415   ° °Behavioral Health Resources in the Community: °Intensive Outpatient Programs °Organization         Address  Phone  Notes  °[facility] [address], [place], [place] 336-878-6098   °[facility] [address], [place], [place] 336-832-9800   °[facility] [address], [place], [place] ° 336-882-2125   °[facility] [address],  °[place], [place] 1-800-853-5163 or 336-641-4981   °Substance Abuse Resources °Organization         Address  Phone  Notes  °[facility]  336-882-2125   °[facility]  336-784-9470   °[facility]  336-285-9073   °[facility]  336-845-3988   °Residential & Outpatient Substance Abuse Program  1-800-659-3381   °Psychological Services °Organization         Address  Phone  Notes  °[facility]  336- 832-9600   °[facility]  336- 378-7881   °[facility] [address], [place] 1-800-853-5163 or 336-641-4981   ° °Mobile Crisis Teams °Organization         Address  Phone  Notes  °[facility], Mobile Crisis Care Unit  1-877-626-1772   °Assertive °[facility] ° [address]  [place], [place] 336-834-9664   °Sharon DeEsch [address] °[place] 336-554-5454   ° °Self-Help/Support Groups °Organization         Address  Phone             Notes  °[facility]  - variety of support groups  336- 373-1402 Call for more information  °Narcotics Anonymous (NA), [facility] [address], °[place]  2 meetings at this location  ° °  Residential Treatment Programs °Organization         Address  Phone  Notes  °[facility] [address],    °[place]  1-866-801-8205   °[facility] ° [address], [place], [place] 704-293-8524   °[facility] [address], [place] 336-845-3988 Admissions: 8am-3pm M-F  °[facility] [address]    °[place], [place] 336-841-1104   °[facility] [address], [place], [place] 336-379-7146   °[facility] [address]  °[place], [place] 336-285-9073   °[facility] Intensive Outpatient [address], [place], [place] 336-852-3033   °[facility] [address]  °[place], [place] 1-877-615-2722 or 336-784-9470   °[facility] [address] [place], [place] 336-227-7417 Accepts Medicaid  °[facility] [address]  °[place] 1-800-659-3381 Substance Abuse/Addiction Treatment  ° °[place] Behavioral Health Resources °Organization         Address  Phone  Notes  °[facility]  (888) 581-9988   °Julie Brannon, PhD [address] [place], [place]   (336) 349-5553 or (336) 951-0000   °[facility]   [address] °[place], [place] (336) 349-4454   °[facility] [address], [place], [place] (336) 342-8316 Insurance/Medicaid/sponsorship through [facility]  °[facility] [address]                                    [place], [place] (336) 342-8316 Therapy/tele-psych/case    °[facility] [address]  ° [place], [place] (336) 349-2233    °Dr. Arfeen  (336) 349-4544   °[facility] [place]  [employer] [place] [facility] 1) [address],  °2) [address], [place] °3)  [address], [place] (336) 349-3220 °(336) 342-7768 ° °(336) 342-8140   °[place] [facility] (336) 342-1394 or (336) 342-3537 (After Hours)    ° ° °Take over the counter tylenol and ibuprofen, as directed on packaging, as needed for discomfort. Apply moist heat or ice to the area(s) of discomfort, for 15 minutes at a time, several times per day for the next few days.  Do not fall asleep on a heating or ice pack.  Call your regular medical doctor on Monday to schedule a follow up appointment this week.  Return to the Emergency Department immediately if worsening. ° °

## 2014-11-03 NOTE — ED Notes (Signed)
Per patient feel down porch steps 2 days ago and hit head on stone walking step. Patient reports intermittent headaches with constant dizziness sine. Patient unsure of LOC but boyfriend states "I believe she did because when I talked to here afterwards her memory was foggy." Denies any nausea, vomiting, or seizures.

## 2014-11-13 ENCOUNTER — Emergency Department (HOSPITAL_COMMUNITY)
Admission: EM | Admit: 2014-11-13 | Discharge: 2014-11-13 | Disposition: A | Discharge status: Home / Self Care | Payer: Self-pay | Attending: Emergency Medicine | Admitting: Emergency Medicine

## 2014-11-13 ENCOUNTER — Encounter (HOSPITAL_COMMUNITY): Payer: Self-pay | Admitting: Emergency Medicine

## 2014-11-13 DIAGNOSIS — Y9389 Activity, other specified: Secondary | ICD-10-CM | POA: Insufficient documentation

## 2014-11-13 DIAGNOSIS — R000 Tachycardia, unspecified: Secondary | ICD-10-CM | POA: Insufficient documentation

## 2014-11-13 DIAGNOSIS — Y998 Other external cause status: Secondary | ICD-10-CM | POA: Insufficient documentation

## 2014-11-13 DIAGNOSIS — Y9289 Other specified places as the place of occurrence of the external cause: Secondary | ICD-10-CM | POA: Insufficient documentation

## 2014-11-13 DIAGNOSIS — W540XXA Bitten by dog, initial encounter: Secondary | ICD-10-CM | POA: Insufficient documentation

## 2014-11-13 DIAGNOSIS — S80811A Abrasion, right lower leg, initial encounter: Secondary | ICD-10-CM | POA: Insufficient documentation

## 2014-11-13 DIAGNOSIS — S71151A Open bite, right thigh, initial encounter: Secondary | ICD-10-CM | POA: Insufficient documentation

## 2014-11-13 DIAGNOSIS — S1081XA Abrasion of other specified part of neck, initial encounter: Secondary | ICD-10-CM | POA: Insufficient documentation

## 2014-11-13 DIAGNOSIS — Z880 Allergy status to penicillin: Secondary | ICD-10-CM | POA: Insufficient documentation

## 2014-11-13 DIAGNOSIS — Z23 Encounter for immunization: Secondary | ICD-10-CM | POA: Insufficient documentation

## 2014-11-13 DIAGNOSIS — T07XXXA Unspecified multiple injuries, initial encounter: Secondary | ICD-10-CM

## 2014-11-13 LAB — POC URINE PREG, ED: Preg Test, Ur: NEGATIVE

## 2014-11-13 MED ORDER — RABIES VACCINE, PCEC IM SUSR
1.0000 mL | Freq: Once | INTRAMUSCULAR | Status: AC
  Administered 2014-11-13: 1 mL via INTRAMUSCULAR
  Filled 2014-11-13: qty 1

## 2014-11-13 MED ORDER — RABIES IMMUNE GLOBULIN 150 UNIT/ML IM INJ
20.0000 [IU]/kg | INJECTION | Freq: Once | INTRAMUSCULAR | Status: AC
  Administered 2014-11-13: 975 [IU] via INTRAMUSCULAR
  Filled 2014-11-13: qty 8

## 2014-11-13 MED ORDER — HYDROCODONE-ACETAMINOPHEN 5-325 MG PO TABS: 1.0000 | ORAL_TABLET | ORAL | Status: DC | PRN

## 2014-11-13 MED ORDER — DOXYCYCLINE HYCLATE 100 MG PO CAPS: 100.0000 mg | ORAL_CAPSULE | Freq: Two times a day (BID) | ORAL | Status: DC

## 2014-11-13 MED ORDER — TRAMADOL HCL 50 MG PO TABS
50.0000 mg | ORAL_TABLET | Freq: Once | ORAL | Status: AC
  Administered 2014-11-13: 50 mg via ORAL
  Filled 2014-11-13: qty 1

## 2014-11-13 MED ORDER — DOXYCYCLINE HYCLATE 100 MG PO TABS
100.0000 mg | ORAL_TABLET | Freq: Once | ORAL | Status: AC
  Administered 2014-11-13: 100 mg via ORAL
  Filled 2014-11-13: qty 1

## 2014-11-13 NOTE — ED Notes (Signed)
[facility] notified of animal bite last night. They will dispatch someone to speak w/patient.

## 2014-11-13 NOTE — ED Notes (Signed)
Patient with no complaints at this time. Respirations even and unlabored. Skin warm/dry. Discharge instructions reviewed with patient at this time. Patient given opportunity to voice concerns/ask questions. Patient discharged at this time and left Emergency Department with steady gait.   
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
## 2014-11-14 ENCOUNTER — Telehealth: Payer: Self-pay | Admitting: Adult Health

## 2014-11-14 MED ORDER — NORGESTIM-ETH ESTRAD TRIPHASIC 0.18/0.215/0.25 MG-35 MCG PO TABS: 1.0000 | ORAL_TABLET | Freq: Every day | ORAL | Status: DC

## 2014-11-14 NOTE — Telephone Encounter (Signed)
No voice mail will rx tri sprintec

## 2014-11-14 NOTE — Telephone Encounter (Signed)
Spoke with pt. Pt was on Amethia birth control. She has no insurance now and its $400.00 for a 3 month supply. She wants something cheaper. Please advise. Thanks!! JSY

## 2014-11-21 ENCOUNTER — Emergency Department (HOSPITAL_COMMUNITY): Payer: Self-pay

## 2014-11-21 ENCOUNTER — Emergency Department (HOSPITAL_COMMUNITY)
Admission: EM | Admit: 2014-11-21 | Discharge: 2014-11-21 | Disposition: A | Discharge status: Home / Self Care | Payer: Self-pay | Attending: Emergency Medicine | Admitting: Emergency Medicine

## 2014-11-21 ENCOUNTER — Encounter (HOSPITAL_COMMUNITY): Payer: Self-pay | Admitting: Emergency Medicine

## 2014-11-21 DIAGNOSIS — Y9289 Other specified places as the place of occurrence of the external cause: Secondary | ICD-10-CM | POA: Insufficient documentation

## 2014-11-21 DIAGNOSIS — Z87442 Personal history of urinary calculi: Secondary | ICD-10-CM | POA: Insufficient documentation

## 2014-11-21 DIAGNOSIS — Z880 Allergy status to penicillin: Secondary | ICD-10-CM | POA: Insufficient documentation

## 2014-11-21 DIAGNOSIS — S20211A Contusion of right front wall of thorax, initial encounter: Secondary | ICD-10-CM | POA: Insufficient documentation

## 2014-11-21 DIAGNOSIS — Y9389 Activity, other specified: Secondary | ICD-10-CM | POA: Insufficient documentation

## 2014-11-21 DIAGNOSIS — Z8742 Personal history of other diseases of the female genital tract: Secondary | ICD-10-CM | POA: Insufficient documentation

## 2014-11-21 DIAGNOSIS — Y998 Other external cause status: Secondary | ICD-10-CM | POA: Insufficient documentation

## 2014-11-21 DIAGNOSIS — Z87440 Personal history of urinary (tract) infections: Secondary | ICD-10-CM | POA: Insufficient documentation

## 2014-11-21 DIAGNOSIS — Z8619 Personal history of other infectious and parasitic diseases: Secondary | ICD-10-CM | POA: Insufficient documentation

## 2014-11-21 DIAGNOSIS — Z79899 Other long term (current) drug therapy: Secondary | ICD-10-CM | POA: Insufficient documentation

## 2014-11-21 DIAGNOSIS — Z3202 Encounter for pregnancy test, result negative: Secondary | ICD-10-CM | POA: Insufficient documentation

## 2014-11-21 DIAGNOSIS — Z792 Long term (current) use of antibiotics: Secondary | ICD-10-CM | POA: Insufficient documentation

## 2014-11-21 LAB — POC URINE PREG, ED: PREG TEST UR: NEGATIVE

## 2014-11-21 MED ORDER — BACLOFEN 10 MG PO TABS: 10.0000 mg | ORAL_TABLET | Freq: Three times a day (TID) | ORAL | Status: AC

## 2014-11-21 MED ORDER — METHOCARBAMOL 500 MG PO TABS
500.0000 mg | ORAL_TABLET | Freq: Once | ORAL | Status: AC
  Administered 2014-11-21: 500 mg via ORAL
  Filled 2014-11-21: qty 1

## 2014-11-21 MED ORDER — HYDROCODONE-ACETAMINOPHEN 5-325 MG PO TABS
1.0000 | ORAL_TABLET | Freq: Once | ORAL | Status: AC
  Administered 2014-11-21: 1 via ORAL
  Filled 2014-11-21: qty 1

## 2014-11-21 MED ORDER — MELOXICAM 7.5 MG PO TABS: ORAL_TABLET | ORAL | Status: DC

## 2014-11-21 NOTE — Discharge Instructions (Signed)
Your oxygen level is 98% on room air. Your x-rays show your lungs to be within normal limits. There no rib fractures appreciated. Suspect contusion to your rib and chest area. Please use Mobitz 2 times daily with food. Please use baclofen 3 times daily. Baclofen may cause drowsiness, please use with caution. Chest Contusion A chest contusion is a deep bruise on your chest area. Contusions are the result of an injury that caused bleeding under the skin. A chest contusion may involve bruising of the skin, muscles, or ribs. The contusion may turn blue, purple, or yellow. Minor injuries will give you a painless contusion, but more severe contusions may stay painful and swollen for a few weeks. CAUSES  A contusion is usually caused by a blow, trauma, or direct force to an area of the body. SYMPTOMS   Swelling and redness of the injured area.  Discoloration of the injured area.  Tenderness and soreness of the injured area.  Pain. DIAGNOSIS  The diagnosis can be made by taking a history and performing a physical exam. An X-ray, CT scan, or MRI may be needed to determine if there were any associated injuries, such as broken bones (fractures) or internal injuries. TREATMENT  Often, the best treatment for a chest contusion is resting, icing, and applying cold compresses to the injured area. Deep breathing exercises may be recommended to reduce the risk of pneumonia. Over-the-counter medicines may also be recommended for pain control. HOME CARE INSTRUCTIONS   Put ice on the injured area.  Put ice in a plastic bag.  Place a towel between your skin and the bag.  Leave the ice on for 15-20 minutes, 03-04 times a day.  Only take over-the-counter or prescription medicines as directed by your caregiver. Your caregiver may recommend avoiding anti-inflammatory medicines (aspirin, ibuprofen, and naproxen) for 48 hours because these medicines may increase bruising.  Rest the injured area.  Perform  deep-breathing exercises as directed by your caregiver.  Stop smoking if you smoke.  Do not lift objects over 5 pounds (2.3 kg) for 3 days or longer if recommended by your caregiver. SEEK IMMEDIATE MEDICAL CARE IF:   You have increased bruising or swelling.  You have pain that is getting worse.  You have difficulty breathing.  You have dizziness, weakness, or fainting.  You have blood in your urine or stool.  You cough up or vomit blood.  Your swelling or pain is not relieved with medicines. MAKE SURE YOU:   Understand these instructions.  Will watch your condition.  Will get help right away if you are not doing well or get worse. Document Released: 03/16/2001 Document Revised: 03/15/2012 Document Reviewed: 12/13/2011 [facility] Patient Information 2015 [place]. This information is not intended to replace advice given to you by your health care provider. Make sure you discuss any questions you have with your health care provider.
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
## 2015-05-20 DIAGNOSIS — Z8742 Personal history of other diseases of the female genital tract: Secondary | ICD-10-CM | POA: Insufficient documentation

## 2015-05-20 DIAGNOSIS — Z87442 Personal history of urinary calculi: Secondary | ICD-10-CM | POA: Insufficient documentation

## 2015-05-20 DIAGNOSIS — R229 Localized swelling, mass and lump, unspecified: Secondary | ICD-10-CM | POA: Insufficient documentation

## 2015-05-20 DIAGNOSIS — Z8619 Personal history of other infectious and parasitic diseases: Secondary | ICD-10-CM | POA: Insufficient documentation

## 2015-05-20 DIAGNOSIS — F17210 Nicotine dependence, cigarettes, uncomplicated: Secondary | ICD-10-CM | POA: Insufficient documentation

## 2015-05-20 DIAGNOSIS — N390 Urinary tract infection, site not specified: Secondary | ICD-10-CM | POA: Insufficient documentation

## 2015-05-20 DIAGNOSIS — Z3202 Encounter for pregnancy test, result negative: Secondary | ICD-10-CM | POA: Insufficient documentation

## 2015-05-20 DIAGNOSIS — Z880 Allergy status to penicillin: Secondary | ICD-10-CM | POA: Insufficient documentation

## 2015-05-20 DIAGNOSIS — Z793 Long term (current) use of hormonal contraceptives: Secondary | ICD-10-CM | POA: Insufficient documentation

## 2015-05-21 ENCOUNTER — Emergency Department (HOSPITAL_COMMUNITY)
Admission: EM | Admit: 2015-05-21 | Discharge: 2015-05-21 | Disposition: A | Discharge status: Home / Self Care | Payer: Self-pay | Attending: Emergency Medicine | Admitting: Emergency Medicine

## 2015-05-21 ENCOUNTER — Encounter (HOSPITAL_COMMUNITY): Payer: Self-pay | Admitting: Emergency Medicine

## 2015-05-21 DIAGNOSIS — R229 Localized swelling, mass and lump, unspecified: Secondary | ICD-10-CM

## 2015-05-21 DIAGNOSIS — N390 Urinary tract infection, site not specified: Secondary | ICD-10-CM

## 2015-05-21 LAB — URINE MICROSCOPIC-ADD ON

## 2015-05-21 LAB — URINALYSIS, ROUTINE W REFLEX MICROSCOPIC
BILIRUBIN URINE: NEGATIVE
Glucose, UA: NEGATIVE mg/dL
KETONES UR: NEGATIVE mg/dL
Leukocytes, UA: NEGATIVE
Nitrite: NEGATIVE
SPECIFIC GRAVITY, URINE: 1.02 (ref 1.005–1.030)
pH: 8.5 — ABNORMAL HIGH (ref 5.0–8.0)

## 2015-05-21 LAB — PREGNANCY, URINE: PREG TEST UR: NEGATIVE

## 2015-05-21 MED ORDER — PHENAZOPYRIDINE HCL 100 MG PO TABS
200.0000 mg | ORAL_TABLET | Freq: Once | ORAL | Status: AC
  Administered 2015-05-21: 200 mg via ORAL

## 2015-05-21 MED ORDER — NITROFURANTOIN MONOHYD MACRO 100 MG PO CAPS: 100.0000 mg | ORAL_CAPSULE | Freq: Two times a day (BID) | ORAL | Status: DC

## 2015-05-21 MED ORDER — NITROFURANTOIN MONOHYD MACRO 100 MG PO CAPS
100.0000 mg | ORAL_CAPSULE | Freq: Once | ORAL | Status: AC
  Administered 2015-05-21: 100 mg via ORAL

## 2015-05-21 MED ORDER — PHENAZOPYRIDINE HCL 200 MG PO TABS: 200.0000 mg | ORAL_TABLET | Freq: Three times a day (TID) | ORAL | Status: DC
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
## 2015-05-21 NOTE — Discharge Instructions (Signed)
Drink plenty of fluids. Take the pyridium for the pain on urination, it will stain your urine orange so wear a panty liner or old underwear. Take the antibiotics until gone. You can take ibuprofen 600 mg and/or acetaminophen 1000 mg 4 times a day for pain. Recheck if you get a fever, vomiting, or symptoms get worse. Call Dr. Lovell SheehanJenkins office to get an appointment to have him recheck the swollen area on your right leg.

## 2015-05-21 NOTE — ED Notes (Signed)
Pt c/o burning when urinates x3 days.

## 2015-05-22 ENCOUNTER — Emergency Department (HOSPITAL_COMMUNITY): Payer: Self-pay

## 2015-05-22 ENCOUNTER — Encounter (HOSPITAL_COMMUNITY): Payer: Self-pay

## 2015-05-22 ENCOUNTER — Emergency Department (HOSPITAL_COMMUNITY)
Admission: EM | Admit: 2015-05-22 | Discharge: 2015-05-23 | Disposition: A | Discharge status: Home / Self Care | Payer: Self-pay | Attending: Emergency Medicine | Admitting: Emergency Medicine

## 2015-05-22 DIAGNOSIS — R109 Unspecified abdominal pain: Secondary | ICD-10-CM

## 2015-05-22 DIAGNOSIS — Z8742 Personal history of other diseases of the female genital tract: Secondary | ICD-10-CM | POA: Insufficient documentation

## 2015-05-22 DIAGNOSIS — F17210 Nicotine dependence, cigarettes, uncomplicated: Secondary | ICD-10-CM | POA: Insufficient documentation

## 2015-05-22 DIAGNOSIS — Z880 Allergy status to penicillin: Secondary | ICD-10-CM | POA: Insufficient documentation

## 2015-05-22 DIAGNOSIS — N390 Urinary tract infection, site not specified: Secondary | ICD-10-CM | POA: Insufficient documentation

## 2015-05-22 DIAGNOSIS — Z8619 Personal history of other infectious and parasitic diseases: Secondary | ICD-10-CM | POA: Insufficient documentation

## 2015-05-22 DIAGNOSIS — Z792 Long term (current) use of antibiotics: Secondary | ICD-10-CM | POA: Insufficient documentation

## 2015-05-22 DIAGNOSIS — Z79899 Other long term (current) drug therapy: Secondary | ICD-10-CM | POA: Insufficient documentation

## 2015-05-22 DIAGNOSIS — Z87442 Personal history of urinary calculi: Secondary | ICD-10-CM | POA: Insufficient documentation

## 2015-05-22 LAB — I-STAT BETA HCG BLOOD, ED (MC, WL, AP ONLY): I-stat hCG, quantitative: <5 m[IU]/mL (ref <5)

## 2015-05-22 NOTE — ED Notes (Signed)
Pt states she is here for a work note. She has not started her RX yet.

## 2015-05-22 NOTE — ED Notes (Signed)
Patient states she is having lower back pain from UTI. Patient was seen and treated yesterday, with no improvement. Patient states she had to leave work early yesterday and today d/t pain.

## 2015-05-22 NOTE — ED Provider Notes (Signed)
CSN: [id number]     Arrival date & time 05/22/15  2227 History  By signing my name below, I, Renee English, attest that this documentation has been prepared under the direction and in the presence of Devoria Albe, MD at 2312. Electronically Signed: Soijett English, ED Scribe. 05/22/2015. 11:22 PM.   Chief Complaint  Patient presents with  . Flank Pain      The history is provided by the patient. No language interpreter was used.    Renee English is a 24 y.o. female with a medical hx of UTI who presents to the Emergency Department complaining of mildly resolving right flank pain onset 4 days ago. She reports that she was seen in the ED yesterday for dysuria and dx with an UTI and Rx macrobid and pyridium that she has not began taking per triage nurse. She reports to me that since filling the Rx she has taken 2 pills total of her abx. She denies taking any motrin for relief of her pain. She notes that she is back today because she was unable to work today because standing at work made her right flank hurt. Pt is having associated symptoms of low back pain, improving dysuria, improving frequency, and urgency. She notes that she has tried 2 tylenol for the  relief of her symptoms tonight. She denies n/v, fever, and any other symptoms. Pt denies any PCP at this time. She notes that there is a possibility that she could be pregnant at this time.  PCP none   Past Medical History  Diagnosis Date  . Renal stones 01/05/2010  . UTI (urinary tract infection)   . BV (bacterial vaginosis) 01/10/2013  . Postcoital bleeding 01/10/2013  . Chlamydia   . Contraceptive management 10/18/2013  . Vaginal irritation 10/18/2013   History reviewed. No pertinent past surgical history. Family History  Problem Relation Age of Onset  . Diabetes Maternal Grandmother   . Hypertension Maternal Grandmother   . Cancer Maternal Grandmother     breast  . Hypertension Maternal Grandfather   . Diabetes Maternal Grandfather   .  Cancer Maternal Aunt     breast   Social History  Substance Use Topics  . Smoking status: Current Every Day Smoker    Types: Cigarettes  . Smokeless tobacco: Never Used  . Alcohol Use: No     Comment: occ.   Employed   OB History    No data available     Review of Systems  Constitutional: Negative for fever.  Gastrointestinal: Negative for nausea and vomiting.  Genitourinary: Positive for dysuria, urgency, frequency and flank pain.  Musculoskeletal: Positive for back pain.  All other systems reviewed and are negative.     Allergies  Penicillins; Sesame oil; Shellfish allergy; and Sulfa antibiotics  Home Medications   Prior to Admission medications   Medication Sig Start Date End Date Taking? Authorizing Provider  acetaminophen (TYLENOL) 500 MG tablet Take 500 mg by mouth every 6 (six) hours as needed for mild pain or moderate pain.   Yes Historical Provider, MD  nitrofurantoin, macrocrystal-monohydrate, (MACROBID) 100 MG capsule Take 1 capsule (100 mg total) by mouth 2 (two) times daily. 05/21/15  Yes Devoria Albe, MD  phenazopyridine (PYRIDIUM) 200 MG tablet Take 1 tablet (200 mg total) by mouth 3 (three) times daily. 05/21/15  Yes Devoria Albe, MD  meloxicam (MOBIC) 7.5 MG tablet 1 po bid with food Patient not taking: Reported on 05/22/2015 11/21/14   Ivery Quale, PA-C   BP  124/78 mmHg  Pulse 78  Temp(Src) 97.4 F (36.3 C) (Oral)  Resp 13  Ht  (1.549 m)  Wt 110 lb (49.896 kg)  BMI 20.80 kg/m2  SpO2 100%  LMP 05/13/2015  Vital signs normal   Physical Exam  Constitutional: She is oriented to person, place, and time. She appears well-developed and well-nourished.  Non-toxic appearance. She does not appear ill. No distress.  HENT:  Head: Normocephalic and atraumatic.  Right Ear: External ear normal.  Left Ear: External ear normal.  Nose: Nose normal. No mucosal edema or rhinorrhea.  Mouth/Throat: Oropharynx is clear and moist and mucous membranes are normal.  No dental abscesses or uvula swelling.  Eyes: Conjunctivae and EOM are normal. Pupils are equal, round, and reactive to light.  Neck: Normal range of motion and full passive range of motion without pain. Neck supple.  Cardiovascular: Normal rate, regular rhythm and normal heart sounds.  Exam reveals no gallop and no friction rub.   No murmur heard. Pulmonary/Chest: Effort normal and breath sounds normal. No respiratory distress. She has no wheezes. She has no rhonchi. She has no rales. She exhibits no tenderness and no crepitus.  Abdominal: Soft. Normal appearance and bowel sounds are normal. She exhibits no distension. There is tenderness in the right lower quadrant. There is no rebound and no guarding.  Mild RLQ tenderness. No rebound or guarding. Mild right flank tenderness to percussion.   Musculoskeletal: Normal range of motion. She exhibits no edema or tenderness.  Moves all extremities well.   Neurological: She is alert and oriented to person, place, and time. She has normal strength. No cranial nerve deficit.  Skin: Skin is warm, dry and intact. No rash noted. No erythema. No pallor.  Psychiatric: She has a normal mood and affect. Her speech is normal and behavior is normal. Her mood appears not anxious.  Nursing note and vitals reviewed.   ED Course  Procedures (including critical care time)  Medications  ketorolac (TORADOL) 30 MG/ML injection 30 mg (30 mg Intravenous Given 05/23/15 0027)    DIAGNOSTIC STUDIES: Oxygen Saturation is 100% on RA, nl by my interpretation.    COORDINATION OF CARE: 11:21 PM Discussed treatment plan with pt at bedside which includes I-stat HCG and CT renal stone study and pt agreed to plan.  Tonight her pain is more in her right flank and her RLQ, when I saw her on the 15th her pain was suprapubic and bilateral lower sacral pain. She has had a CT scan several years ago that showed a left ureteral stone. CT renal was ordered tonight.    12:11 AM- Pt  informed of CT results and was informed to continue and complete abx Rx. She was given IV toradol for her pain  12:46 AM- Pt rechecked and toradol injection is beginning to help her pain. We discussed taking motrin for her pain at home and to finish her abx. Pt was informed to be rechecked if she has fever or vomiting.     Labs Review Results for orders placed or performed during the hospital encounter of 05/22/15  I-Stat beta hCG blood, ED  Result Value Ref Range   I-stat hCG, quantitative <5.0 <5 mIU/mL   Comment 3             Medications  ketorolac (TORADOL) 30 MG/ML injection 30 mg (30 mg Intravenous Given 05/23/15 0027)     Imaging Review Ct Renal Stone Study  05/22/2015  CLINICAL DATA:  Right flank and  right lower quadrant pain. Prior history of renal stone. EXAM: CT ABDOMEN AND PELVIS WITHOUT CONTRAST TECHNIQUE: Multidetector CT imaging of the abdomen and pelvis was performed following the standard protocol without IV contrast. COMPARISON:  CT 02/23/2012 FINDINGS: Lower chest:  The included lung bases are clear. Liver: No evidence of focal lesion allowing for lack of contrast. Hepatobiliary: Gallbladder minimally distended, no calcified stone. No biliary dilatation. Pancreas: No ductal dilatation or inflammation. Spleen: Normal in size and density. Adrenal glands: No nodule. Kidneys: Bilateral nonobstructing nephrolithiasis. Single punctate stone in the interpolar right kidney, with 2 stones in the lower left kidney. No hydronephrosis or perinephric stranding. Both ureters are decompressed without stones along the course. Stomach/Bowel: The stomach is distended with ingested contents. There are no dilated or thickened small bowel loops. Small volume of stool throughout the colon without colonic wall thickening. Portions of the appendix are normal where visualized, no pericecal or right lower quadrant inflammatory change. Vascular/Lymphatic: No retroperitoneal adenopathy. Abdominal aorta  is normal in caliber. Reproductive: Uterus is normal in size. Ovaries are symmetric in size. No evidence of adnexal mass. Bladder: Near completely decompressed and contains a single focus of intravesicular air. There is mild soft tissue stranding in the deep pelvis, likely perivesicular. Other: No free air, free fluid, or intra-abdominal fluid collection. The previous enlarged right inguinal lymph nodes have resolved. There is no inguinal adenopathy. Musculoskeletal: There are no acute or suspicious osseous abnormalities. IMPRESSION: 1. Bilateral nonobstructing renal calculi. No hydronephrosis or obstructive uropathy. 2. Mild soft tissue stranding in the deep pelvis, likely perivesicular and can be seen in the setting of cystitis. Pelvic inflammatory disease could have a similar appearance. No evidence of pelvic abscess. Electronically Signed   By: Rubye OaksMelanie  Ehinger M.D.   On: 05/22/2015 23:54   I have personally reviewed and evaluated these images and lab results as part of my medical decision-making.   EKG Interpretation None      MDM   Final diagnoses:  Urinary tract infection without hematuria, site unspecified  Right flank pain   Plan discharge  Devoria AlbeIva Amiah Frohlich, MD, FACEP   I personally performed the services described in this documentation, which was scribed in my presence. The recorded information has been reviewed and considered.  Devoria AlbeIva Janaia Kozel, MD, [facility] PyoFACEP    Arnetha Silverthorne, MD 05/23/15 0111

## 2015-05-23 MED ORDER — KETOROLAC TROMETHAMINE 30 MG/ML IJ SOLN
30.0000 mg | Freq: Once | INTRAMUSCULAR | Status: AC
  Administered 2015-05-23: 30 mg via INTRAVENOUS
  Filled 2015-05-23: qty 1

## 2015-05-23 NOTE — Discharge Instructions (Signed)
Finish your antibiotics. Take the ibuprofen with the acetaminophen for your pain. Recheck if you get a fever, vomiting or seem worse instead of better. YOU NEED TO GET A PRIMARY CARE DOCTOR. YOU CAN GO TO URGENT CARE.

## 2015-09-08 ENCOUNTER — Emergency Department (HOSPITAL_COMMUNITY)
Admission: EM | Admit: 2015-09-08 | Discharge: 2015-09-08 | Disposition: A | Discharge status: Home / Self Care | Payer: Self-pay | Attending: Emergency Medicine | Admitting: Emergency Medicine

## 2015-09-08 ENCOUNTER — Encounter (HOSPITAL_COMMUNITY): Payer: Self-pay | Admitting: *Deleted

## 2015-09-08 DIAGNOSIS — F17210 Nicotine dependence, cigarettes, uncomplicated: Secondary | ICD-10-CM | POA: Insufficient documentation

## 2015-09-08 DIAGNOSIS — F1910 Other psychoactive substance abuse, uncomplicated: Secondary | ICD-10-CM | POA: Insufficient documentation

## 2015-09-08 DIAGNOSIS — Z87442 Personal history of urinary calculi: Secondary | ICD-10-CM | POA: Insufficient documentation

## 2015-09-08 DIAGNOSIS — F32A Depression, unspecified: Secondary | ICD-10-CM

## 2015-09-08 DIAGNOSIS — F1410 Cocaine abuse, uncomplicated: Secondary | ICD-10-CM | POA: Insufficient documentation

## 2015-09-08 DIAGNOSIS — R197 Diarrhea, unspecified: Secondary | ICD-10-CM | POA: Insufficient documentation

## 2015-09-08 DIAGNOSIS — F329 Major depressive disorder, single episode, unspecified: Secondary | ICD-10-CM | POA: Insufficient documentation

## 2015-09-08 MED ORDER — ACETAMINOPHEN 325 MG PO TABS
650.0000 mg | ORAL_TABLET | Freq: Once | ORAL | Status: AC
  Administered 2015-09-08: 650 mg via ORAL
  Filled 2015-09-08: qty 2

## 2015-09-08 MED ORDER — LOPERAMIDE HCL 2 MG PO CAPS
4.0000 mg | ORAL_CAPSULE | Freq: Once | ORAL | Status: AC
  Administered 2015-09-08: 4 mg via ORAL
  Filled 2015-09-08: qty 2

## 2015-09-08 MED ORDER — CLONIDINE HCL 0.2 MG PO TABS
0.2000 mg | ORAL_TABLET | Freq: Once | ORAL | Status: AC
  Administered 2015-09-08: 0.2 mg via ORAL
  Filled 2015-09-08: qty 1

## 2015-09-08 MED ORDER — ONDANSETRON HCL 4 MG PO TABS
4.0000 mg | ORAL_TABLET | Freq: Once | ORAL | Status: AC
  Administered 2015-09-08: 4 mg via ORAL
  Filled 2015-09-08: qty 1

## 2015-09-08 MED ORDER — METOCLOPRAMIDE HCL 10 MG PO TABS: 10.0000 mg | ORAL_TABLET | Freq: Four times a day (QID) | ORAL | Status: DC | PRN

## 2015-09-08 MED ORDER — IBUPROFEN 800 MG PO TABS
800.0000 mg | ORAL_TABLET | Freq: Once | ORAL | Status: AC
  Administered 2015-09-08: 800 mg via ORAL
  Filled 2015-09-08: qty 1

## 2015-09-08 MED ORDER — CLONIDINE HCL 0.1 MG PO TABS: ORAL_TABLET | ORAL | Status: DC

## 2015-09-08 NOTE — ED Provider Notes (Signed)
CSN: [id number]     Arrival date & time 09/08/15  1918 History  By signing my name below, I, Renee English, attest that this documentation has been prepared under the direction and in the presence of Renee Booze, MD.   Electronically Signed: [name], ED Scribe. 09/08/2015. 8:29 PM  Chief complaint: Substance abuse  The history is provided by the patient. No language interpreter was used.    HPI Comments: Renee English is a 25 y.o. female with hx of substance abuse with cocaine and prescription pills including Roxicodone who presents to the Emergency Department for help to stop using drugs. Pt reports she has been using cocaine and Roxicodone for a few years and is a regular user. She states her last time using both substances was a day or two ago. She reports associated withdrawal symptoms including fatigue, diarrhea, depression, irritation, diaphoresis, shaking, and mood swings. No worsening or alleviating factors noted. Pt denies SI/HI, visual hallucinations, auditory hallucinations, hx of heroin, hx of alcohol abuse, or any other pertinent symptoms.    Past Medical History  Diagnosis Date  . Renal stones 01/05/2010  . UTI (urinary tract infection)   . BV (bacterial vaginosis) 01/10/2013  . Postcoital bleeding 01/10/2013  . Chlamydia   . Contraceptive management 10/18/2013  . Vaginal irritation 10/18/2013   History reviewed. No pertinent past surgical history. Family History  Problem Relation Age of Onset  . Diabetes Maternal Grandmother   . Hypertension Maternal Grandmother   . Cancer Maternal Grandmother     breast  . Hypertension Maternal Grandfather   . Diabetes Maternal Grandfather   . Cancer Maternal Aunt     breast   Social History  Substance Use Topics  . Smoking status: Current Every Day Smoker    Types: Cigarettes  . Smokeless tobacco: Never Used  . Alcohol Use: No     Comment: occ.   OB History    No data available     Review of Systems   Constitutional: Positive for diaphoresis and fatigue.  Gastrointestinal: Positive for diarrhea.  Psychiatric/Behavioral: Positive for dysphoric mood. Negative for suicidal ideas, hallucinations and self-injury.  All other systems reviewed and are negative.    Allergies  Penicillins; Sesame oil; Shellfish allergy; and Sulfa antibiotics  Home Medications   Prior to Admission medications   Medication Sig Start Date End Date Taking? Authorizing Provider  acetaminophen (TYLENOL) 500 MG tablet Take 500 mg by mouth every 6 (six) hours as needed for mild pain or moderate pain.    Historical Provider, MD  meloxicam (MOBIC) 7.5 MG tablet 1 po bid with food Patient not taking: Reported on 05/22/2015 11/21/14   Ivery Quale, PA-C  nitrofurantoin, macrocrystal-monohydrate, (MACROBID) 100 MG capsule Take 1 capsule (100 mg total) by mouth 2 (two) times daily. 05/21/15   Devoria Albe, MD  phenazopyridine (PYRIDIUM) 200 MG tablet Take 1 tablet (200 mg total) by mouth 3 (three) times daily. 05/21/15   Devoria Albe, MD   BP 130/88 mmHg  Pulse 88  Temp(Src) 98.1 F (36.7 C) (Oral)  Resp 18  Ht  (1.575 m)  Wt 99 lb 6.4 oz (45.088 kg)  BMI 18.18 kg/m2  SpO2 100% Physical Exam  Constitutional: She is oriented to person, place, and time. She appears well-developed and well-nourished. No distress.  HENT:  Head: Normocephalic and atraumatic.  Eyes: EOM are normal. Pupils are equal, round, and reactive to light.  Neck: Normal range of motion. Neck supple. No JVD present.  Cardiovascular:  Normal rate, regular rhythm and normal heart sounds.   No murmur heard. Pulmonary/Chest: Effort normal and breath sounds normal. She has no wheezes. She has no rales. She exhibits no tenderness.  Abdominal: Soft. Bowel sounds are normal. She exhibits no distension and no mass. There is no tenderness.  Musculoskeletal: Normal range of motion. She exhibits no edema.  Lymphadenopathy:    She has no cervical adenopathy.   Neurological: She is alert and oriented to person, place, and time. No cranial nerve deficit. She exhibits normal muscle tone. Coordination normal.  Skin: Skin is warm and dry. No rash noted.  Psychiatric: Her behavior is normal. Judgment and thought content normal.  Mildly depressed affect.  Nursing note and vitals reviewed.   ED Course  Procedures (including critical care time) DIAGNOSTIC STUDIES: Oxygen Saturation is 100% on RA, normal by my interpretation.    COORDINATION OF CARE: 8:14 PM-Discussed treatment plan which includes ibuprofen for pain management, follow up with out-patient resources, and clonidine to help minimize withdrawal symptoms with pt at bedside and pt agreed to plan.    MDM   Final diagnoses:  Polysubstance abuse  Depression   Polysubstance abuse. She has mild depression without any suicidal ideation. No symptoms that would qualify her for inpatient treatment. This was explained to patient and her mother. No indication for testing for medical clearance since she does not qualify for inpatient care. Patient is given resources to go for outpatient detox. She is given prescriptions for metoclopramide and clonidine. Initial dose of clonidine, ibuprofen, ondansetron, and loperamide given in the ED. She is advised to use over-the-counter NSAIDs and loperamide and acetaminophen as needed.   I personally performed the services described in this documentation, which was scribed in my presence. The recorded information has been reviewed and is accurate.       Renee Boozeavid Sohana Austell, MD 09/09/15 [phone]

## 2015-09-08 NOTE — ED Notes (Signed)
Pt states wants help getting off pain pills & cocaine, last used yesterday.

## 2015-09-08 NOTE — ED Notes (Signed)
Pt alert & oriented x4, stable gait. Patient given discharge instructions, paperwork & prescription(s). Patient  instructed to stop at the registration desk to finish any additional paperwork. Patient verbalized understanding. Pt left department w/ no further questions. 

## 2015-09-08 NOTE — ED Notes (Addendum)
Pt requesting assistance with drug abuse.  Reports using cocaine and "pills". States last use was a couple days ago.  Pt denies SI/HI at this time.  Pt reporting symptoms of anxiety.  Reports serious drug use for past year. Pt very tearful during triage.

## 2015-09-08 NOTE — Discharge Instructions (Signed)
Take ibuprofen and acetaminophen as needed for pain. Take loperamide (Immodium AD) as needed for diarrhea.  Substance Abuse Treatment Programs  Intensive Outpatient Programs [facility]     [address], [place]       [facility] [place] [address] [place], [place]  [facility]     (Inpatient and outpatient)     [address]           [place] [phone] (Suboxone and Methadone)  [place], [place] [address]      [place] [address] [place], [place]  [facility] (Outpatient/Inpatient, Chemical)    (insurance only) [phone]             [facility] ([place]) [place], [place]     [facility]     [address]     [place], [place]       [facility] (for caregivers and family) [phone] [address] Kristeen Mans. [place], [place]      Residential Treatment Programs [facility]      [address], [place], [place] [address]  [phone]       [facility] [address] [place], [place] [address] [phone]  [facility]        [phone]       [facility] [phone]  [facility] ([place]             [place], [place] or [place] of [place], [address] [phone]  [facility]    [address]      [place], [place]       [facility] [address] [place], [place]  [place]   [address] [place], [place]  [address]     [phone]      Admissions: 8am-3pm M-F  [facility] ([facility]) [address] [place], [place]  [facility] Program: Residential Program [phone] Days)   [place], [place] or [phone]     [facility]AT[facility]: [facility] [place], [place] (Walk in Hours over the weekend or by referral)  [facility] [place], [place], [place] [address] [phone]  Crisis Mobile: [facility]:  [phone] (for crisis response 24 hours a day) [facility] Hotline:      [phone] Outpatient Psychiatry and Counseling  [facility]: Mobile Crisis Management 24 hours:  [phone]  [facility] [employer] sliding scale fee and walk in schedule: M-F 8am-12pm/1pm-3pm [place], [place] [address] [place], [place] [address] [phone]  [facility] ([facility])- new patient walk-in appointments available Monday - Friday 8am -3pm.          [address] [place], [place] [address] [phone] or crisis line- [facility]/ Intensive Outpatient Therapy Program [place], [place] [address] [place]      [phone] [address] [place], [place] [address]                 [place]   [facility] [phone]. [place], [place] [address]   [employer] [facility]          [address] Johnette Abraham  [place], [place] [address]       [phone]  [facility] [address] [place], [place] [address] [phone]  [facility]    [address] [place], [place] [address]     [place],  [place] Joycelyn Man     [place] [address]     [phone]       [facility] [place] [address]  [facility]     [address], [place], [place], [place] [address] [place]     [address]     [place], [place] [address]     [phone]       [facility] [address] [place], [place] [address] [phone] Resources for Temporary Residential Assistance/Crisis [facility]) M-F 8am-3pm   [address], [place] [address]   [phone] Services include: laundry, barbering, support groups, case management, phone  & computer access, showers, AA/NA mtgs, mental health/substance abuse nurse, job skills class, disability information, VA assistance, spiritual classes, etc.   [facility]   [address] [place]     [place]              [employer] (women and children)       [place]. [place], [place] [address] [phone] [email or web address] for application and process Application Required  [facility]   [address]    [place] [address]     [phone]                    [place], [place] [address] [phone](PNPYYFRT application appt.) Application Required  [employer] (women only)    [place]  [place], [place] [address]     [phone]      Intake starts 6pm daily Need valid ID, SSC, & Police report [employer] [address] [place], [place] [phone] Application Required  [employer] (men only)     [place].      [place],  [place]       [facility] [place] (Pregnant women only) [address] [place], [place]  [facility]      [place] [address].      [place], [place] [address]     [phone]             [facility] [address] [place], [place] 90 day commitment/SA/Application process  [facility](men only)     [address]     [place], [place]       Check-in at [facility] [address] [place], [place] [address] [phone] Men/Women/Women and Children must be there by 7 pm  [place], [place] [phone]                 Clonidine tablets What is this medicine? CLONIDINE (KLOE ni deen) is used to treat high blood pressure. This medicine may be used for other purposes; ask your health care provider or pharmacist if you have questions. What should I tell my health care provider before I take this medicine? They need to know if you have any of these conditions: -kidney disease -an unusual or allergic reaction to clonidine, other medicines, foods, dyes, or preservatives -pregnant or trying to get pregnant -breast-feeding How should I use this medicine? Take this medicine by mouth with a glass of water. Follow the directions on the prescription label. Take your doses at regular intervals. Do not take your medicine more often than directed. Do not suddenly stop taking this medicine. You must gradually reduce the dose or you may get a dangerous increase in blood pressure. Ask your doctor or health care professional for advice. Talk to your pediatrician regarding the use of this medicine in children. Special care may be needed. Overdosage: If you think you have taken too much of this medicine contact a poison control center or emergency room at once. NOTE: This medicine is only for you. Do not share this medicine with others. What  if I miss a dose? If you miss a dose, take it as soon as you can. If it is almost time for your next dose, take only that dose. Do not take double or extra doses. What may interact with this medicine? Do not take this medicine with any of the following medications: -MAOIs like Carbex, Eldepryl, Marplan, Nardil, and Parnate This medicine may also interact with the following medications: -barbiturate medicines for inducing sleep or treating seizures like phenobarbital -certain medicines for blood pressure, heart disease, irregular heart beat -certain medicines for depression, anxiety, or psychotic disturbances -prescription pain medicines This list may not describe all possible interactions. Give your health care provider a list of all the medicines, herbs, non-prescription drugs, or dietary supplements you use. Also tell them if you smoke, drink alcohol, or use illegal drugs. Some items may interact with your medicine. What should I watch for while using this medicine? Visit your doctor or health care  professional for regular checks on your progress. Check your heart rate and blood pressure regularly while you are taking this medicine. Ask your doctor or health care professional what your heart rate should be and when you should contact him or her. You may get drowsy or dizzy. Do not drive, use machinery, or do anything that needs mental alertness until you know how this medicine affects you. To avoid dizzy or fainting spells, do not stand or sit up quickly, especially if you are an older person. Alcohol can make you more drowsy and dizzy. Avoid alcoholic drinks. Your mouth may get dry. Chewing sugarless gum or sucking hard candy, and drinking plenty of water will help. Do not treat yourself for coughs, colds, or pain while you are taking this medicine without asking your doctor or health care professional for advice. Some ingredients may increase your blood pressure. If you are going to have surgery  tell your doctor or health care professional that you are taking this medicine. What side effects may I notice from receiving this medicine? Side effects that you should report to your doctor or health care professional as soon as possible: -allergic reactions like skin rash, itching or hives, swelling of the face, lips, or tongue -anxiety, nervousness -chest pain -depression -fast, irregular heartbeat -swelling of feet or legs -unusually weak or tired Side effects that usually do not require medical attention (report to your doctor or health care professional if they continue or are bothersome): -change in sex drive or performance -constipation -headache This list may not describe all possible side effects. Call your doctor for medical advice about side effects. You may report side effects to FDA at 1-800-FDA-1088. Where should I keep my medicine? Keep out of the reach of children. Store at room temperature between 15 and 30 degrees C (59 and 86 degrees F). Protect from light. Keep container tightly closed. Throw away any unused medicine after the expiration date. NOTE: This sheet is a summary. It may not cover all possible information. If you have questions about this medicine, talk to your doctor, pharmacist, or health care provider.    2016, Elsevier/Gold Standard. (2010-12-16 13:01:28)  Metoclopramide tablets What is this medicine? METOCLOPRAMIDE (met oh kloe PRA mide) is used to treat the symptoms of gastroesophageal reflux disease (GERD) like heartburn. It is also used to treat people with slow emptying of the stomach and intestinal tract. This medicine may be used for other purposes; ask your health care provider or pharmacist if you have questions. What should I tell my health care provider before I take this medicine? They need to know if you have any of these conditions: -breast cancer -depression -diabetes -heart failure -high blood pressure -kidney disease -liver  disease -Parkinson's disease or a movement disorder -pheochromocytoma -seizures -stomach obstruction, bleeding, or perforation -an unusual or allergic reaction to metoclopramide, procainamide, sulfites, other medicines, foods, dyes, or preservatives -pregnant or trying to get pregnant -breast-feeding How should I use this medicine? Take this medicine by mouth with a glass of water. Follow the directions on the prescription label. Take this medicine on an empty stomach, about 30 minutes before eating. Take your doses at regular intervals. Do not take your medicine more often than directed. Do not stop taking except on the advice of your doctor or health care professional. A special MedGuide will be given to you by the pharmacist with each prescription and refill. Be sure to read this information carefully each time. Talk to your pediatrician regarding the use  of this medicine in children. Special care may be needed. Overdosage: If you think you have taken too much of this medicine contact a poison control center or emergency room at once. NOTE: This medicine is only for you. Do not share this medicine with others. What if I miss a dose? If you miss a dose, take it as soon as you can. If it is almost time for your next dose, take only that dose. Do not take double or extra doses. What may interact with this medicine? -acetaminophen -cyclosporine -digoxin -medicines for blood pressure -medicines for diabetes, including insulin -medicines for hay fever and other allergies -medicines for depression, especially an Monoamine Oxidase Inhibitor (MAOI) -medicines for Parkinson's disease, like levodopa -medicines for sleep or for pain -tetracycline This list may not describe all possible interactions. Give your health care provider a list of all the medicines, herbs, non-prescription drugs, or dietary supplements you use. Also tell them if you smoke, drink alcohol, or use illegal drugs. Some items  may interact with your medicine. What should I watch for while using this medicine? It may take a few weeks for your stomach condition to start to get better. However, do not take this medicine for longer than 12 weeks. The longer you take this medicine, and the more you take it, the greater your chances are of developing serious side effects. If you are an elderly patient, a female patient, or you have diabetes, you may be at an increased risk for side effects from this medicine. Contact your doctor immediately if you start having movements you cannot control such as lip smacking, rapid movements of the tongue, involuntary or uncontrollable movements of the eyes, head, arms and legs, or muscle twitches and spasms. Patients and their families should watch out for worsening depression or thoughts of suicide. Also watch out for any sudden or severe changes in feelings such as feeling anxious, agitated, panicky, irritable, hostile, aggressive, impulsive, severely restless, overly excited and hyperactive, or not being able to sleep. If this happens, especially at the beginning of treatment or after a change in dose, call your doctor. Do not treat yourself for high fever. Ask your doctor or health care professional for advice. You may get drowsy or dizzy. Do not drive, use machinery, or do anything that needs mental alertness until you know how this drug affects you. Do not stand or sit up quickly, especially if you are an older patient. This reduces the risk of dizzy or fainting spells. Alcohol can make you more drowsy and dizzy. Avoid alcoholic drinks. What side effects may I notice from receiving this medicine? Side effects that you should report to your doctor or health care professional as soon as possible: -allergic reactions like skin rash, itching or hives, swelling of the face, lips, or tongue -abnormal production of milk in females -breast enlargement in both males and females -change in the way you  walk -difficulty moving, speaking or swallowing -drooling, lip smacking, or rapid movements of the tongue -excessive sweating -fever -involuntary or uncontrollable movements of the eyes, head, arms and legs -irregular heartbeat or palpitations -muscle twitches and spasms -unusually weak or tired Side effects that usually do not require medical attention (report to your doctor or health care professional if they continue or are bothersome): -change in sex drive or performance -depressed mood -diarrhea -difficulty sleeping -headache -menstrual changes -restless or nervous This list may not describe all possible side effects. Call your doctor for medical advice about side effects. You  may report side effects to FDA at 1-800-FDA-1088. Where should I keep my medicine? Keep out of the reach of children. Store at room temperature between 20 and 25 degrees C (68 and 77 degrees F). Protect from light. Keep container tightly closed. Throw away any unused medicine after the expiration date. NOTE: This sheet is a summary. It may not cover all possible information. If you have questions about this medicine, talk to your doctor, pharmacist, or health care provider.    2016, Elsevier/Gold Standard. (2011-10-19 13:04:38)

## 2016-01-19 ENCOUNTER — Emergency Department (HOSPITAL_COMMUNITY): Payer: Self-pay

## 2016-01-19 ENCOUNTER — Encounter (HOSPITAL_COMMUNITY): Payer: Self-pay | Admitting: Emergency Medicine

## 2016-01-19 ENCOUNTER — Emergency Department (HOSPITAL_COMMUNITY)
Admission: EM | Admit: 2016-01-19 | Discharge: 2016-01-19 | Disposition: A | Discharge status: Home / Self Care | Payer: Self-pay | Attending: Emergency Medicine | Admitting: Emergency Medicine

## 2016-01-19 DIAGNOSIS — S8002XA Contusion of left knee, initial encounter: Secondary | ICD-10-CM | POA: Insufficient documentation

## 2016-01-19 DIAGNOSIS — Y999 Unspecified external cause status: Secondary | ICD-10-CM | POA: Insufficient documentation

## 2016-01-19 DIAGNOSIS — Y939 Activity, unspecified: Secondary | ICD-10-CM | POA: Insufficient documentation

## 2016-01-19 DIAGNOSIS — W108XXA Fall (on) (from) other stairs and steps, initial encounter: Secondary | ICD-10-CM | POA: Insufficient documentation

## 2016-01-19 DIAGNOSIS — Y929 Unspecified place or not applicable: Secondary | ICD-10-CM | POA: Insufficient documentation

## 2016-01-19 DIAGNOSIS — F17210 Nicotine dependence, cigarettes, uncomplicated: Secondary | ICD-10-CM | POA: Insufficient documentation

## 2016-01-19 LAB — POC URINE PREG, ED: PREG TEST UR: NEGATIVE

## 2016-01-19 MED ORDER — DICLOFENAC SODIUM 50 MG PO TBEC: 50.0000 mg | DELAYED_RELEASE_TABLET | Freq: Two times a day (BID) | ORAL | Status: DC

## 2016-01-19 NOTE — ED Provider Notes (Signed)
CSN: [id number]     Arrival date & time 01/19/16  1506 History  By signing my name below, I, Renee English, attest that this documentation has been prepared under the direction and in the presence of Renee BuffaloHope Neese, NP. Electronically Signed: Alyssa GroveMartin English, ED Scribe. 01/19/2016. 3:46 PM.   Chief Complaint  Patient presents with  . Knee Pain   The history is provided by the patient. No language interpreter was used.    HPI Comments: Renee English is a 25 y.o. female who presents to the Emergency Department complaining of constant left knee pain s/p fall 4 days ago. Pt reports associated swelling to knee. Pt states she was trying to walk down the wooden steps when she fell down 5 stairs and hit her knee. Pt states that the kids around her told her that her knee was bent to the side. Pt states she has felt the knee pop in and out and that she is no longer able to put pressure on her knee. Pt has used Ibuprofen and ice which have provided mild relief to swelling, but no relief to pain.   Past Medical History  Diagnosis Date  . Renal stones 01/05/2010  . UTI (urinary tract infection)   . BV (bacterial vaginosis) 01/10/2013  . Postcoital bleeding 01/10/2013  . Chlamydia   . Contraceptive management 10/18/2013  . Vaginal irritation 10/18/2013   History reviewed. No pertinent past surgical history. Family History  Problem Relation Age of Onset  . Diabetes Maternal Grandmother   . Hypertension Maternal Grandmother   . Cancer Maternal Grandmother     breast  . Hypertension Maternal Grandfather   . Diabetes Maternal Grandfather   . Cancer Maternal Aunt     breast   Social History  Substance Use Topics  . Smoking status: Current Every Day Smoker    Types: Cigarettes  . Smokeless tobacco: Never Used  . Alcohol Use: No     Comment: occ.   OB History    No data available     Review of Systems  Musculoskeletal: Positive for arthralgias (Left Knee).  All other systems reviewed and are  negative.   Allergies  Penicillins; Sesame oil; Shellfish allergy; and Sulfa antibiotics  Home Medications   Prior to Admission medications   Medication Sig Start Date End Date Taking? Authorizing Provider  cloNIDine (CATAPRES) 0.1 MG tablet 1 tab po tid x 2 days, then bid x 2 days, then once daily x 2 days Patient not taking: Reported on 01/19/2016 09/08/15   Renee Boozeavid Glick, MD  diclofenac (VOLTAREN) 50 MG EC tablet Take 1 tablet (50 mg total) by mouth 2 (two) times daily. 01/19/16   Renee Orlene OchM Neese, NP  metoCLOPramide (REGLAN) 10 MG tablet Take 1 tablet (10 mg total) by mouth every 6 (six) hours as needed for nausea. Patient not taking: Reported on 01/19/2016 09/08/15   Renee Boozeavid Glick, MD   BP 116/74 mmHg  Pulse 97  Temp(Src) 98.1 F (36.7 C) (Oral)  Resp 16  Ht 5\' 2"  (1.575 m)  Wt 45.36 kg  BMI 18.29 kg/m2  SpO2 100%  LMP 12/13/2015 Physical Exam  Constitutional: She appears well-developed and well-nourished.  HENT:  Head: Normocephalic.  Eyes: Conjunctivae are normal.  Cardiovascular: Normal rate.   Pulses:      Dorsalis pedis pulses are 2+ on the right side, and 2+ on the left side.  Pulmonary/Chest: Effort normal. No respiratory distress.  Abdominal: She exhibits no distension.  Musculoskeletal: Normal range of motion.  Abrasions noted to left knee Tenderness to patella with palpation and movement Pain with flexion Internal and external rotation Tenderness over medial collateral ligament   Neurological: She is alert.  Skin: Skin is warm and dry.  Psychiatric: She has a normal mood and affect. Her behavior is normal.  Nursing note and vitals reviewed.   ED Course  Procedures (including critical care time)  DIAGNOSTIC STUDIES: Oxygen Saturation is 100% on RA, normal by my interpretation.    COORDINATION OF CARE: 3:31 PM Discussed treatment plan with pt at bedside which includes Pregnancy test and DG Knee Complete 4 Views Left and pt agreed to plan.  Labs Review Labs  Reviewed  POC URINE PREG, ED    Imaging Review Dg Knee Complete 4 Views Left  01/19/2016  CLINICAL DATA:  Left knee pain for 5 days after fall. Unable to bear weight. EXAM: LEFT KNEE - COMPLETE 4+ VIEW COMPARISON:  None. FINDINGS: Osseous alignment is normal. Bone mineralization is normal. No fracture line or displaced fracture fragment seen. No appreciable joint effusion and adjacent soft tissues are unremarkable. IMPRESSION: Negative. Electronically Signed   By: Bary Richard M.D.   On: 01/19/2016 16:40    MDM  25 y.o. female with left knee pain s/p fall stable for d/c without focal neuro deficits. Knee immobilizer applied, ice, elevation, NSAIDS crutches and f/u with ortho if symptoms persist. Discussed with the patient and all questioned fully answered. She will return if any problems arise.   Final diagnoses:  Contusion of left knee, initial encounter   I personally performed the services described in this documentation, which was scribed in my presence. The recorded information has been reviewed and is accurate.   [address] [place], [place] 01/21/16 1610  Renee Racer, MD 01/22/16 [phone]

## 2016-01-19 NOTE — Discharge Instructions (Signed)
The x-ray today shows no fracture or dislocation. X-rays do not show if there is ligament injury.  Wear the knee immobilizer for comfort take the medication for pain and inflammation as directed. Follow up with Dr. Romeo AppleHarrison if pain persist.

## 2016-01-19 NOTE — ED Notes (Signed)
Patient states she fell down the steps two days ago injuring her left knee.

## 2016-04-26 ENCOUNTER — Emergency Department (HOSPITAL_COMMUNITY)
Admission: EM | Admit: 2016-04-26 | Discharge: 2016-04-26 | Disposition: A | Discharge status: Home / Self Care | Payer: No Typology Code available for payment source | Attending: Emergency Medicine | Admitting: Emergency Medicine

## 2016-04-26 ENCOUNTER — Encounter (HOSPITAL_COMMUNITY): Payer: Self-pay | Admitting: Emergency Medicine

## 2016-04-26 ENCOUNTER — Emergency Department (HOSPITAL_COMMUNITY): Payer: No Typology Code available for payment source

## 2016-04-26 DIAGNOSIS — Y92410 Unspecified street and highway as the place of occurrence of the external cause: Secondary | ICD-10-CM | POA: Diagnosis not present

## 2016-04-26 DIAGNOSIS — F17210 Nicotine dependence, cigarettes, uncomplicated: Secondary | ICD-10-CM | POA: Insufficient documentation

## 2016-04-26 DIAGNOSIS — Y9389 Activity, other specified: Secondary | ICD-10-CM | POA: Diagnosis not present

## 2016-04-26 DIAGNOSIS — Z79899 Other long term (current) drug therapy: Secondary | ICD-10-CM | POA: Insufficient documentation

## 2016-04-26 DIAGNOSIS — Y999 Unspecified external cause status: Secondary | ICD-10-CM | POA: Insufficient documentation

## 2016-04-26 DIAGNOSIS — S299XXA Unspecified injury of thorax, initial encounter: Secondary | ICD-10-CM | POA: Diagnosis present

## 2016-04-26 DIAGNOSIS — S20212A Contusion of left front wall of thorax, initial encounter: Secondary | ICD-10-CM | POA: Insufficient documentation

## 2016-04-26 MED ORDER — IBUPROFEN 800 MG PO TABS: 800.0000 mg | ORAL_TABLET | Freq: Three times a day (TID) | ORAL | 0 refills | Status: DC

## 2016-04-26 MED ORDER — METHOCARBAMOL 500 MG PO TABS
500.0000 mg | ORAL_TABLET | Freq: Four times a day (QID) | ORAL | 0 refills | Status: DC
Start: 2016-04-26 — End: 2017-03-01

## 2016-04-26 MED ORDER — IBUPROFEN 800 MG PO TABS
800.0000 mg | ORAL_TABLET | Freq: Once | ORAL | Status: AC
  Administered 2016-04-26: 800 mg via ORAL
  Filled 2016-04-26: qty 1
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
## 2016-05-25 ENCOUNTER — Encounter (HOSPITAL_COMMUNITY): Payer: Self-pay | Admitting: Emergency Medicine

## 2016-05-25 ENCOUNTER — Emergency Department (HOSPITAL_COMMUNITY)
Admission: EM | Admit: 2016-05-25 | Discharge: 2016-05-25 | Disposition: A | Discharge status: Home / Self Care | Payer: Self-pay | Attending: Emergency Medicine | Admitting: Emergency Medicine

## 2016-05-25 DIAGNOSIS — J039 Acute tonsillitis, unspecified: Secondary | ICD-10-CM | POA: Insufficient documentation

## 2016-05-25 DIAGNOSIS — Z79899 Other long term (current) drug therapy: Secondary | ICD-10-CM | POA: Insufficient documentation

## 2016-05-25 DIAGNOSIS — F17210 Nicotine dependence, cigarettes, uncomplicated: Secondary | ICD-10-CM | POA: Insufficient documentation

## 2016-05-25 LAB — RAPID STREP SCREEN (MED CTR MEBANE ONLY): STREPTOCOCCUS, GROUP A SCREEN (DIRECT): NEGATIVE

## 2016-05-25 MED ORDER — PENICILLIN G BENZATHINE 1200000 UNIT/2ML IM SUSP
1.2000 10*6.[IU] | Freq: Once | INTRAMUSCULAR | Status: AC
  Administered 2016-05-25: 1.2 10*6.[IU] via INTRAMUSCULAR
  Filled 2016-05-25: qty 2

## 2016-05-25 MED ORDER — PREDNISONE 20 MG PO TABS: 20.0000 mg | ORAL_TABLET | Freq: Every day | ORAL | 0 refills | Status: DC

## 2016-05-25 MED ORDER — MAGIC MOUTHWASH W/LIDOCAINE: 5.0000 mL | Freq: Three times a day (TID) | ORAL | 0 refills | Status: DC | PRN

## 2016-05-25 NOTE — ED Triage Notes (Signed)
Pt reports sore throat and fever with cough since yesterday.  Pt alert and oriented.

## 2016-05-25 NOTE — Discharge Instructions (Signed)
Drink plenty of fluids.  Tylenol or ibuprofen every 4 to 6 hrs if needed for pain or fever.  Follow-up with your doctor or return here for any worsening symptoms

## 2016-05-27 LAB — CULTURE, GROUP A STREP (THRC)

## 2016-05-28 NOTE — ED Provider Notes (Signed)
[facility] Provider Note   CSN: [id number] Arrival date & time: 05/25/16  1503     History   Chief Complaint Chief Complaint  Patient presents with  . Sore Throat    HPI Renee English is a 25 y.o. female.  HPI   Renee English is a 25 y.o. female who presents to the Emergency Department complaining of sore throat.  She describes a worsening throat pain that  Began one day prior to arrival.  Sore throat symptoms are associated with swallowing and non-productive cough.  She also describes having white spots on both tonsils.  Pain worse with swallowing.  She also complains of body aches, fever and chills.  She denies neck pain or stiffness, vomiting, rash, and abdominal pain.   Past Medical History:  Diagnosis Date  . BV (bacterial vaginosis) 01/10/2013  . Chlamydia   . Contraceptive management 10/18/2013  . Postcoital bleeding 01/10/2013  . Renal stones 01/05/2010  . UTI (urinary tract infection)   . Vaginal irritation 10/18/2013    Patient Active Problem List   Diagnosis Date Noted  . Contraceptive management 10/18/2013  . Vaginal irritation 10/18/2013  . BV (bacterial vaginosis) 01/10/2013  . Postcoital bleeding 01/10/2013    History reviewed. No pertinent surgical history.  OB History    No data available       Home Medications    Prior to Admission medications   Medication Sig Start Date End Date Taking? Authorizing Provider  Phenylephrine-DM-GG-APAP (MUCINEX FAST-MAX COLD FLU) 5-10-200-325 MG/10ML LIQD Take 15 mLs by mouth daily as needed (for sore throat).   Yes Historical Provider, MD  cloNIDine (CATAPRES) 0.1 MG tablet 1 tab po tid x 2 days, then bid x 2 days, then once daily x 2 days Patient not taking: Reported on 01/19/2016 09/08/15   Dione Booze, MD  diclofenac (VOLTAREN) 50 MG EC tablet Take 1 tablet (50 mg total) by mouth 2 (two) times daily. Patient not taking: Reported on 05/25/2016 01/19/16   Janne Napoleon, NP  ibuprofen (ADVIL,MOTRIN)  800 MG tablet Take 1 tablet (800 mg total) by mouth 3 (three) times daily. Patient not taking: Reported on 05/25/2016 04/26/16   Elson Areas, PA-C  magic mouthwash w/lidocaine SOLN Take 5 mLs by mouth 3 (three) times daily as needed for mouth pain. Swish and spit, do not swallow 05/25/16   Seaira Byus, PA-C  methocarbamol (ROBAXIN) 500 MG tablet Take 1 tablet (500 mg total) by mouth 4 (four) times daily. Patient not taking: Reported on 05/25/2016 04/26/16   Elson Areas, PA-C  predniSONE (DELTASONE) 20 MG tablet Take 1 tablet (20 mg total) by mouth daily. For 5 days 05/25/16   Pauline Aus, PA-C    Family History Family History  Problem Relation Age of Onset  . Diabetes Maternal Grandmother   . Hypertension Maternal Grandmother   . Cancer Maternal Grandmother     breast  . Hypertension Maternal Grandfather   . Diabetes Maternal Grandfather   . Cancer Maternal Aunt     breast    Social History Social History  Substance Use Topics  . Smoking status: Current Every Day Smoker    Types: Cigarettes  . Smokeless tobacco: Never Used  . Alcohol use Yes     Comment: occ.     Allergies   Sulfa antibiotics; Sesame oil; and Shellfish allergy   Review of Systems Review of Systems  Constitutional: Positive for appetite change, chills and fever. Negative for activity change.  HENT:  Positive for sore throat and trouble swallowing. Negative for congestion, ear pain, facial swelling and voice change.   Eyes: Negative for pain and visual disturbance.  Respiratory: Positive for cough. Negative for shortness of breath.   Gastrointestinal: Negative for abdominal pain, nausea and vomiting.  Musculoskeletal: Negative for arthralgias, neck pain and neck stiffness.  Skin: Negative for color change and rash.  Neurological: Negative for dizziness, facial asymmetry, speech difficulty, numbness and headaches.  Hematological: Negative for adenopathy.  All other systems reviewed and are  negative.    Physical Exam Updated Vital Signs BP 116/75 (BP Location: Left Arm)   Pulse 106   Temp 98.5 F (36.9 C) (Oral)   Resp 18   Ht 5\' 2"  (1.575 m)   Wt 47.6 kg   LMP 05/23/2016 (Exact Date)   SpO2 100%   BMI 19.20 kg/m   Physical Exam  Constitutional: She is oriented to person, place, and time. She appears well-developed and well-nourished. No distress.  HENT:  Head: Normocephalic and atraumatic.  Right Ear: Tympanic membrane and ear canal normal.  Left Ear: Tympanic membrane and ear canal normal.  Mouth/Throat: Uvula is midline and mucous membranes are normal. No trismus in the jaw. No uvula swelling. Posterior oropharyngeal edema and posterior oropharyngeal erythema present. No oropharyngeal exudate or tonsillar abscesses. Tonsils are 2+ on the right. Tonsils are 2+ on the left. Tonsillar exudate.  Erythema, edema and exudates to bilateral tonsils.  No abscess.  Uvula is non-edematous and midline. Handles secretions well.   Neck: Normal range of motion. Neck supple.  Cardiovascular: Normal rate, regular rhythm and normal heart sounds.   Pulmonary/Chest: Effort normal and breath sounds normal.  Abdominal: Soft. She exhibits no distension. There is no splenomegaly. There is no tenderness.  Musculoskeletal: Normal range of motion.  Lymphadenopathy:    She has cervical adenopathy.  Neurological: She is alert and oriented to person, place, and time. She exhibits normal muscle tone. Coordination normal.  Skin: Skin is warm and dry.  Nursing note and vitals reviewed.    ED Treatments / Results  Labs (all labs ordered are listed, but only abnormal results are displayed) Labs Reviewed  RAPID STREP SCREEN (NOT AT [facility])  CULTURE, GROUP A STREP [facility]    EKG  EKG Interpretation None       Radiology No results found.  Procedures Procedures (including critical care time)  Medications Ordered in ED Medications  penicillin g benzathine (BICILLIN LA) 1200000  UNIT/2ML injection 1.2 Million Units (1.2 Million Units Intramuscular Given 05/25/16 1733)     Initial Impression / Assessment and Plan / ED Course  I have reviewed the triage vital signs and the nursing notes.  Pertinent labs & imaging results that were available during my care of the patient were reviewed by me and considered in my medical decision making (see chart for details).  Clinical Course     Tonsillitis w/o abscess.  Airway patent.  Pt non-toxic appearing.  Mucous membranes are moist.    Agrees to tx plan and close PMD f/u if needed.   Final Clinical Impressions(s) / ED Diagnoses   Final diagnoses:  Tonsillitis with exudate    New Prescriptions Discharge Medication List as of 05/25/2016  6:04 PM    START taking these medications   Details  magic mouthwash w/lidocaine SOLN Take 5 mLs by mouth 3 (three) times daily as needed for mouth pain. Swish and spit, do not swallow, Starting Tue 05/25/2016, Print    predniSONE (DELTASONE) 20  MG tablet Take 1 tablet (20 mg total) by mouth daily. For 5 days, Starting Tue 05/25/2016, Print         Mclane Arora Doverriplett, [place]PA-C 05/28/16 2135    Mancel BaleElliott Wentz, MD 05/29/16 [phone]

## 2016-07-23 ENCOUNTER — Encounter (HOSPITAL_COMMUNITY): Payer: Self-pay | Admitting: Emergency Medicine

## 2016-07-23 ENCOUNTER — Emergency Department (HOSPITAL_COMMUNITY)
Admission: EM | Admit: 2016-07-23 | Discharge: 2016-07-23 | Disposition: A | Discharge status: Home / Self Care | Payer: Self-pay | Attending: Emergency Medicine | Admitting: Emergency Medicine

## 2016-07-23 DIAGNOSIS — R6889 Other general symptoms and signs: Secondary | ICD-10-CM

## 2016-07-23 DIAGNOSIS — R112 Nausea with vomiting, unspecified: Secondary | ICD-10-CM | POA: Insufficient documentation

## 2016-07-23 DIAGNOSIS — F17210 Nicotine dependence, cigarettes, uncomplicated: Secondary | ICD-10-CM | POA: Insufficient documentation

## 2016-07-23 DIAGNOSIS — R51 Headache: Secondary | ICD-10-CM | POA: Insufficient documentation

## 2016-07-23 DIAGNOSIS — H9202 Otalgia, left ear: Secondary | ICD-10-CM | POA: Insufficient documentation

## 2016-07-23 DIAGNOSIS — R0981 Nasal congestion: Secondary | ICD-10-CM | POA: Insufficient documentation

## 2016-07-23 DIAGNOSIS — Z79899 Other long term (current) drug therapy: Secondary | ICD-10-CM | POA: Insufficient documentation

## 2016-07-23 DIAGNOSIS — R05 Cough: Secondary | ICD-10-CM | POA: Insufficient documentation

## 2016-07-23 DIAGNOSIS — R509 Fever, unspecified: Secondary | ICD-10-CM | POA: Insufficient documentation

## 2016-07-23 LAB — POC URINE PREG, ED: Preg Test, Ur: NEGATIVE

## 2016-07-23 MED ORDER — IBUPROFEN 400 MG PO TABS: 400.0000 mg | ORAL_TABLET | Freq: Four times a day (QID) | ORAL | 0 refills | Status: DC | PRN

## 2016-07-23 MED ORDER — IBUPROFEN 400 MG PO TABS
400.0000 mg | ORAL_TABLET | Freq: Once | ORAL | Status: AC
  Administered 2016-07-23: 400 mg via ORAL
  Filled 2016-07-23: qty 1

## 2016-07-23 MED ORDER — ONDANSETRON 8 MG PO TBDP
8.0000 mg | ORAL_TABLET | Freq: Once | ORAL | Status: AC
  Administered 2016-07-23: 8 mg via ORAL
  Filled 2016-07-23: qty 1

## 2016-07-23 MED ORDER — BENZONATATE 100 MG PO CAPS: 100.0000 mg | ORAL_CAPSULE | Freq: Three times a day (TID) | ORAL | 0 refills | Status: DC

## 2016-07-23 MED ORDER — PSEUDOEPHEDRINE HCL ER 120 MG PO TB12: 120.0000 mg | ORAL_TABLET | Freq: Two times a day (BID) | ORAL | 0 refills | Status: DC

## 2016-07-23 NOTE — ED Triage Notes (Signed)
Since weds, pt has had URI symptoms, now with malaise, body aches and N  Had no flu shot

## 2016-07-23 NOTE — Discharge Instructions (Signed)
Take the medications as needed. Follow-up with her primary doctor if not getting better in the next week

## 2016-07-23 NOTE — ED Provider Notes (Signed)
[facility]-EMERGENCY DEPT Provider Note   CSN: [id number] Arrival date & time: 07/23/16  2123  By signing my name below, I, Linna Darner, attest that this documentation has been prepared under the direction and in the presence of physician practitioner, Linwood Dibbles, MD. Electronically Signed: Linna Darner, Scribe. 07/23/2016. 10:31 PM.  History   Chief Complaint Chief Complaint  Patient presents with  . URI    since Weds  . Influenza    The history is provided by the patient. No language interpreter was used.     HPI Comments: Renee English is a 26 y.o. female who presents to the Emergency Department with concern for the flu beginning a couple of days ago. She reports a persistent headache, body aches, subjective fever/chills, nausea, vomiting, cough, nasal congestion, and left ear pain. Pt notes she has sick contacts with similar symptoms at work. Her LNMP was 3 weeks ago and she denies the possibility of pregnancy. Pt denies dysuria or any other associated symptoms.  Past Medical History:  Diagnosis Date  . BV (bacterial vaginosis) 01/10/2013  . Chlamydia   . Contraceptive management 10/18/2013  . Postcoital bleeding 01/10/2013  . Renal stones 01/05/2010  . UTI (urinary tract infection)   . Vaginal irritation 10/18/2013    Patient Active Problem List   Diagnosis Date Noted  . Contraceptive management 10/18/2013  . Vaginal irritation 10/18/2013  . BV (bacterial vaginosis) 01/10/2013  . Postcoital bleeding 01/10/2013    History reviewed. No pertinent surgical history.  OB History    No data available       Home Medications    Prior to Admission medications   Medication Sig Start Date End Date Taking? Authorizing Provider  benzonatate (TESSALON) 100 MG capsule Take 1 capsule (100 mg total) by mouth every 8 (eight) hours. 07/23/16   Linwood Dibbles, MD  cloNIDine (CATAPRES) 0.1 MG tablet 1 tab po tid x 2 days, then bid x 2 days, then once daily x 2 days Patient not taking:  Reported on 01/19/2016 09/08/15   Dione Booze, MD  diclofenac (VOLTAREN) 50 MG EC tablet Take 1 tablet (50 mg total) by mouth 2 (two) times daily. Patient not taking: Reported on 05/25/2016 01/19/16   Janne Napoleon, NP  ibuprofen (ADVIL,MOTRIN) 400 MG tablet Take 1 tablet (400 mg total) by mouth every 6 (six) hours as needed. 07/23/16   Linwood Dibbles, MD  magic mouthwash w/lidocaine SOLN Take 5 mLs by mouth 3 (three) times daily as needed for mouth pain. Swish and spit, do not swallow 05/25/16   Tammy Triplett, PA-C  methocarbamol (ROBAXIN) 500 MG tablet Take 1 tablet (500 mg total) by mouth 4 (four) times daily. Patient not taking: Reported on 05/25/2016 04/26/16   Elson Areas, PA-C  Phenylephrine-DM-GG-APAP [facility] FAST-MAX COLD FLU) 5-10-200-325 MG/10ML LIQD Take 15 mLs by mouth daily as needed (for sore throat).    Historical Provider, MD  predniSONE (DELTASONE) 20 MG tablet Take 1 tablet (20 mg total) by mouth daily. For 5 days 05/25/16   Tammy Triplett, PA-C  pseudoephedrine (SUDAFED 12 HOUR) 120 MG 12 hr tablet Take 1 tablet (120 mg total) by mouth every 12 (twelve) hours. 07/23/16   Linwood Dibbles, MD    Family History Family History  Problem Relation Age of Onset  . Diabetes Maternal Grandmother   . Hypertension Maternal Grandmother   . Cancer Maternal Grandmother     breast  . Hypertension Maternal Grandfather   . Diabetes Maternal Grandfather   .  Cancer Maternal Aunt     breast    Social History Social History  Substance Use Topics  . Smoking status: Current Every Day Smoker    Packs/day: 0.25    Types: Cigarettes  . Smokeless tobacco: Never Used  . Alcohol use Yes     Comment: occ.     Allergies   Sulfa antibiotics; Sesame oil; and Shellfish allergy   Review of Systems Review of Systems  Constitutional: Positive for chills and fever.  HENT: Positive for congestion and ear pain (L).   Respiratory: Positive for cough.   Gastrointestinal: Positive for nausea and vomiting.    Genitourinary: Negative for dysuria.  Neurological: Positive for headaches.  All other systems reviewed and are negative.    Physical Exam Updated Vital Signs BP 113/78 (BP Location: Left Arm)   Pulse 103   Temp 97.6 F (36.4 C) (Oral)   Resp 18   Ht 5\' 2"  (1.575 m)   Wt 47.8 kg   LMP 07/01/2016   SpO2 100%   BMI 19.27 kg/m   Physical Exam  Constitutional: She appears well-developed and well-nourished. No distress.  HENT:  Head: Normocephalic and atraumatic.  Right Ear: Tympanic membrane and external ear normal.  Left Ear: Tympanic membrane and external ear normal.  Nasal congestion. TM's normal.  Eyes: Conjunctivae are normal. Right eye exhibits no discharge. Left eye exhibits no discharge. No scleral icterus.  Neck: Neck supple. No tracheal deviation present.  Cardiovascular: Normal rate, regular rhythm and intact distal pulses.   Pulmonary/Chest: Effort normal and breath sounds normal. No stridor. No respiratory distress. She has no wheezes. She has no rales.  Coughing during exam.  Abdominal: Soft. Bowel sounds are normal. She exhibits no distension. There is no tenderness. There is no rebound and no guarding.  Musculoskeletal: She exhibits no edema or tenderness.  Neurological: She is alert. She has normal strength. No cranial nerve deficit (no facial droop, extraocular movements intact, no slurred speech) or sensory deficit. She exhibits normal muscle tone. She displays no seizure activity. Coordination normal.  Skin: Skin is warm and dry. No rash noted.  Psychiatric: She has a normal mood and affect.  Nursing note and vitals reviewed.    ED Treatments / Results  Labs (all labs ordered are listed, but only abnormal results are displayed) Labs Reviewed  POC URINE PREG, ED    Procedures Procedures (including critical care time)  DIAGNOSTIC STUDIES: Oxygen Saturation is 100% on RA, normal by my interpretation.    COORDINATION OF CARE: 10:38 PM Discussed  treatment plan with pt at bedside and pt agreed to plan.  Medications Ordered in ED Medications  ibuprofen (ADVIL,MOTRIN) tablet 400 mg (400 mg Oral Given 07/23/16 2251)  ondansetron (ZOFRAN-ODT) disintegrating tablet 8 mg (8 mg Oral Given 07/23/16 2251)     Initial Impression / Assessment and Plan / ED Course  I have reviewed the triage vital signs and the nursing notes.  Pertinent labs & imaging results that were available during my care of the patient were reviewed by me and considered in my medical decision making (see chart for details).    Suspect influenza.  Exam reassuring.  Doubt pna or bacterial infection.  Dc home, supportive care.   Final Clinical Impressions(s) / ED Diagnoses   Final diagnoses:  Flu-like symptoms    New Prescriptions Discharge Medication List as of 07/23/2016 10:43 PM    START taking these medications   Details  benzonatate (TESSALON) 100 MG capsule Take 1 capsule (  100 mg total) by mouth every 8 (eight) hours., Starting Fri 07/23/2016, Print    pseudoephedrine (SUDAFED 12 HOUR) 120 MG 12 hr tablet Take 1 tablet (120 mg total) by mouth every 12 (twelve) hours., Starting Fri 07/23/2016, Print       I personally performed the services described in this documentation, which was scribed in my presence.  The recorded information has been reviewed and is accurate.    Linwood DibblesJon Timoth Schara, MD 07/25/16 1410

## 2016-11-16 ENCOUNTER — Emergency Department (HOSPITAL_COMMUNITY): Payer: Self-pay

## 2016-11-16 ENCOUNTER — Emergency Department (HOSPITAL_COMMUNITY)
Admission: EM | Admit: 2016-11-16 | Discharge: 2016-11-16 | Disposition: A | Discharge status: Home / Self Care | Payer: Self-pay | Attending: Emergency Medicine | Admitting: Emergency Medicine

## 2016-11-16 DIAGNOSIS — J039 Acute tonsillitis, unspecified: Secondary | ICD-10-CM | POA: Insufficient documentation

## 2016-11-16 DIAGNOSIS — F17210 Nicotine dependence, cigarettes, uncomplicated: Secondary | ICD-10-CM | POA: Insufficient documentation

## 2016-11-16 DIAGNOSIS — Z79899 Other long term (current) drug therapy: Secondary | ICD-10-CM | POA: Insufficient documentation

## 2016-11-16 DIAGNOSIS — Z20818 Contact with and (suspected) exposure to other bacterial communicable diseases: Secondary | ICD-10-CM

## 2016-11-16 DIAGNOSIS — R062 Wheezing: Secondary | ICD-10-CM | POA: Insufficient documentation

## 2016-11-16 LAB — RAPID STREP SCREEN (MED CTR MEBANE ONLY): STREPTOCOCCUS, GROUP A SCREEN (DIRECT): NEGATIVE

## 2016-11-16 LAB — POC URINE PREG, ED: PREG TEST UR: NEGATIVE

## 2016-11-16 MED ORDER — MAGIC MOUTHWASH W/LIDOCAINE: 10.0000 mL | Freq: Four times a day (QID) | ORAL | 0 refills | Status: DC | PRN

## 2016-11-16 MED ORDER — ALBUTEROL SULFATE HFA 108 (90 BASE) MCG/ACT IN AERS
2.0000 | INHALATION_SPRAY | Freq: Once | RESPIRATORY_TRACT | Status: AC
  Administered 2016-11-16: 2 via RESPIRATORY_TRACT
  Filled 2016-11-16: qty 6.7

## 2016-11-16 MED ORDER — LIDOCAINE VISCOUS 2 % MT SOLN
15.0000 mL | Freq: Once | OROMUCOSAL | Status: AC
  Administered 2016-11-16: 15 mL via OROMUCOSAL
  Filled 2016-11-16: qty 15

## 2016-11-16 MED ORDER — AMOXICILLIN 500 MG PO CAPS: 500.0000 mg | ORAL_CAPSULE | Freq: Three times a day (TID) | ORAL | 0 refills | Status: AC

## 2016-11-16 MED ORDER — AMOXICILLIN 250 MG PO CAPS
500.0000 mg | ORAL_CAPSULE | Freq: Once | ORAL | Status: AC
  Administered 2016-11-16: 500 mg via ORAL
  Filled 2016-11-16: qty 2

## 2016-11-16 MED ORDER — IPRATROPIUM-ALBUTEROL 0.5-2.5 (3) MG/3ML IN SOLN
3.0000 mL | Freq: Once | RESPIRATORY_TRACT | Status: AC
  Administered 2016-11-16: 3 mL via RESPIRATORY_TRACT
  Filled 2016-11-16: qty 3

## 2016-11-16 MED ORDER — IBUPROFEN 800 MG PO TABS
800.0000 mg | ORAL_TABLET | Freq: Once | ORAL | Status: AC
  Administered 2016-11-16: 800 mg via ORAL
  Filled 2016-11-16: qty 1

## 2016-11-16 MED ORDER — ALBUTEROL SULFATE (2.5 MG/3ML) 0.083% IN NEBU
2.5000 mg | INHALATION_SOLUTION | Freq: Once | RESPIRATORY_TRACT | Status: AC
  Administered 2016-11-16: 2.5 mg via RESPIRATORY_TRACT
  Filled 2016-11-16: qty 3

## 2016-11-16 NOTE — ED Notes (Signed)
Respiratory paged about inhaler with spacer.

## 2016-11-16 NOTE — ED Triage Notes (Signed)
Pt reports sore throat and intermittent low grade fever began this weekend. Use of OTC medications with no relief. Co-worker was dx with strept throat.

## 2016-11-16 NOTE — Discharge Instructions (Signed)
Take your next dose of the amoxil this evening before bed with a snack.  Use 2 puffs of your inhaler every 4 hours if you are coughing or wheezing.  Rest and make sure you are drinking plenty of fluids.  Return for any worsened symptoms including shortness of breath, increased weakness or worsening difficulty swallowing.

## 2016-11-18 NOTE — ED Provider Notes (Signed)
[facility] DEPT Provider Note   CSN: [id number] Arrival date & time: 11/16/16  1403     History   Chief Complaint Chief Complaint  Patient presents with  . Sore Throat    HPI Renee English is a 26 y.o. female presenting with sore throat, swollen glands in her neck, subjective fevers and chills present for the past 3-4 days.  She reports positive exposure to strep throat at work.    Symptoms include wheezing and shortness of breath, denies chest pain,  Nausea, vomiting or diarrhea.  The patient has taken tylenol which has helped reduce her fever but no improvement in sore throat symptoms. .  The history is provided by the patient.    Past Medical History:  Diagnosis Date  . BV (bacterial vaginosis) 01/10/2013  . Chlamydia   . Contraceptive management 10/18/2013  . Postcoital bleeding 01/10/2013  . Renal stones 01/05/2010  . UTI (urinary tract infection)   . Vaginal irritation 10/18/2013    Patient Active Problem List   Diagnosis Date Noted  . Contraceptive management 10/18/2013  . Vaginal irritation 10/18/2013  . BV (bacterial vaginosis) 01/10/2013  . Postcoital bleeding 01/10/2013    No past surgical history on file.  OB History    No data available       Home Medications    Prior to Admission medications   Medication Sig Start Date End Date Taking? Authorizing Provider  amoxicillin (AMOXIL) 500 MG capsule Take 1 capsule (500 mg total) by mouth 3 (three) times daily. 11/16/16 11/26/16  Burgess Amor, PA-C  benzonatate (TESSALON) 100 MG capsule Take 1 capsule (100 mg total) by mouth every 8 (eight) hours. 07/23/16   Linwood Dibbles, MD  cloNIDine (CATAPRES) 0.1 MG tablet 1 tab po tid x 2 days, then bid x 2 days, then once daily x 2 days Patient not taking: Reported on 01/19/2016 09/08/15   Dione Booze, MD  diclofenac (VOLTAREN) 50 MG EC tablet Take 1 tablet (50 mg total) by mouth 2 (two) times daily. Patient not taking: Reported on 05/25/2016 01/19/16   Janne Napoleon, NP    ibuprofen (ADVIL,MOTRIN) 400 MG tablet Take 1 tablet (400 mg total) by mouth every 6 (six) hours as needed. 07/23/16   Linwood Dibbles, MD  magic mouthwash w/lidocaine SOLN Take 10 mLs by mouth 4 (four) times daily as needed (throat pain). Note to pharmacy - equal parts diphendydramine, aluminum hydroxide and lidocaine HCL 11/16/16   Kenan Moodie, Raynelle Fanning, PA-C  methocarbamol (ROBAXIN) 500 MG tablet Take 1 tablet (500 mg total) by mouth 4 (four) times daily. Patient not taking: Reported on 05/25/2016 04/26/16   Elson Areas, PA-C  Phenylephrine-DM-GG-APAP (MUCINEX FAST-MAX COLD FLU) 5-10-200-325 MG/10ML LIQD Take 15 mLs by mouth daily as needed (for sore throat).    [provider]  predniSONE (DELTASONE) 20 MG tablet Take 1 tablet (20 mg total) by mouth daily. For 5 days 05/25/16   Triplett, Tammy, PA-C  pseudoephedrine (SUDAFED 12 HOUR) 120 MG 12 hr tablet Take 1 tablet (120 mg total) by mouth every 12 (twelve) hours. 07/23/16   Linwood Dibbles, MD    Family History Family History  Problem Relation Age of Onset  . Diabetes Maternal Grandmother   . Hypertension Maternal Grandmother   . Cancer Maternal Grandmother        breast  . Hypertension Maternal Grandfather   . Diabetes Maternal Grandfather   . Cancer Maternal Aunt        breast    Social  History Social History  Substance Use Topics  . Smoking status: Current Every Day Smoker    Packs/day: 0.25    Types: Cigarettes  . Smokeless tobacco: Never Used  . Alcohol use Yes     Comment: occ.     Allergies   Sulfa antibiotics; Sesame oil; and Shellfish allergy   Review of Systems Review of Systems  Constitutional: Positive for chills and fever.  HENT: Positive for sore throat. Negative for congestion, ear pain, rhinorrhea, sinus pressure, trouble swallowing and voice change.   Eyes: Negative for discharge.  Respiratory: Negative for cough, shortness of breath, wheezing and stridor.   Cardiovascular: Negative for chest pain.   Gastrointestinal: Negative for abdominal pain, nausea and vomiting.  Genitourinary: Negative.      Physical Exam Updated Vital Signs BP 93/67 (BP Location: Left Arm)   Pulse 89   Temp 98.2 F (36.8 C) (Oral)   Resp 20   Ht 5\' 2"  (1.575 m)   Wt 110 lb (49.9 kg)   LMP 10/25/2016   SpO2 99%   BMI 20.12 kg/m   Physical Exam  Constitutional: She is oriented to person, place, and time. She appears well-developed and well-nourished.  HENT:  Head: Normocephalic and atraumatic.  Right Ear: Tympanic membrane and ear canal normal.  Left Ear: Tympanic membrane and ear canal normal.  Nose: No mucosal edema or rhinorrhea.  Mouth/Throat: Uvula is midline and mucous membranes are normal. Posterior oropharyngeal erythema present. No oropharyngeal exudate, posterior oropharyngeal edema or tonsillar abscesses. Tonsils are 2+ on the right. Tonsils are 2+ on the left. Tonsillar exudate.  Symmetric enlargement of bilateral tonsils.    Eyes: Conjunctivae are normal.  Cardiovascular: Normal rate and normal heart sounds.   Pulmonary/Chest: Effort normal. No respiratory distress. She has wheezes. She has no rales. She exhibits no tenderness.  Bilateral expiratory wheeze.  Abdominal: Soft. There is no tenderness.  Musculoskeletal: Normal range of motion.  Lymphadenopathy:       Head (right side): Tonsillar adenopathy present.       Head (left side): Tonsillar adenopathy present.  Neurological: She is alert and oriented to person, place, and time.  Skin: Skin is warm and dry. No rash noted.  Psychiatric: She has a normal mood and affect.     ED Treatments / Results  Labs (all labs ordered are listed, but only abnormal results are displayed) Labs Reviewed  RAPID STREP SCREEN (NOT AT [facility])  CULTURE, GROUP A STREP ([facility])  POC URINE PREG, ED    EKG  EKG Interpretation None       Radiology No results found.  Procedures Procedures (including critical care time)  Medications Ordered  in ED Medications  ipratropium-albuterol (DUONEB) 0.5-2.5 (3) MG/3ML nebulizer solution 3 mL (3 mLs Nebulization Given 11/16/16 1606)  albuterol (PROVENTIL) (2.5 MG/3ML) 0.083% nebulizer solution 2.5 mg (2.5 mg Nebulization Given 11/16/16 1606)  lidocaine (XYLOCAINE) 2 % viscous mouth solution 15 mL (15 mLs Mouth/Throat Given 11/16/16 1549)  ibuprofen (ADVIL,MOTRIN) tablet 800 mg (800 mg Oral Given 11/16/16 1549)  albuterol (PROVENTIL HFA;VENTOLIN HFA) 108 (90 Base) MCG/ACT inhaler 2 puff (2 puffs Inhalation Given 11/16/16 1710)  amoxicillin (AMOXIL) capsule 500 mg (500 mg Oral Given 11/16/16 1701)     Initial Impression / Assessment and Plan / ED Course  I have reviewed the triage vital signs and the nursing notes.  Pertinent labs & imaging results that were available during my care of the patient were reviewed by me and considered in my medical  decision making (see chart for details).     Pt given albuterol neb with improvement in wheezing, no sob at time of dc.  Strep negative but with exam and positive exposure,  I suspect this test is falsely negative. Placed on amoxil, also given albuterol mdi with spacer. Rest, increased fluids, tylenol/motrin, prn f/u here for any worsened or persistent sx.   Final Clinical Impressions(s) / ED Diagnoses   Final diagnoses:  Tonsillitis  Strep throat exposure  Wheezing    New Prescriptions Discharge Medication List as of 11/16/2016  5:03 PM    START taking these medications   Details  amoxicillin (AMOXIL) 500 MG capsule Take 1 capsule (500 mg total) by mouth 3 (three) times daily., Starting Tue 11/16/2016, Until Fri 11/26/2016, Print         Burgess Amor, PA-C 11/19/16 1600    Vanetta Mulders, MD 11/21/16 [phone]

## 2016-11-19 LAB — CULTURE, GROUP A STREP (THRC)

## 2017-03-01 ENCOUNTER — Emergency Department (HOSPITAL_COMMUNITY): Payer: Self-pay

## 2017-03-01 ENCOUNTER — Emergency Department (HOSPITAL_COMMUNITY)
Admission: EM | Admit: 2017-03-01 | Discharge: 2017-03-01 | Disposition: A | Discharge status: Home / Self Care | Payer: Self-pay | Attending: Emergency Medicine | Admitting: Emergency Medicine

## 2017-03-01 ENCOUNTER — Encounter (HOSPITAL_COMMUNITY): Payer: Self-pay | Admitting: *Deleted

## 2017-03-01 DIAGNOSIS — S2232XA Fracture of one rib, left side, initial encounter for closed fracture: Secondary | ICD-10-CM | POA: Insufficient documentation

## 2017-03-01 DIAGNOSIS — F17210 Nicotine dependence, cigarettes, uncomplicated: Secondary | ICD-10-CM | POA: Insufficient documentation

## 2017-03-01 DIAGNOSIS — Y929 Unspecified place or not applicable: Secondary | ICD-10-CM | POA: Insufficient documentation

## 2017-03-01 DIAGNOSIS — Y939 Activity, unspecified: Secondary | ICD-10-CM | POA: Insufficient documentation

## 2017-03-01 DIAGNOSIS — Y999 Unspecified external cause status: Secondary | ICD-10-CM | POA: Insufficient documentation

## 2017-03-01 LAB — PREGNANCY, URINE: Preg Test, Ur: NEGATIVE

## 2017-03-01 MED ORDER — HYDROCODONE-ACETAMINOPHEN 5-325 MG PO TABS
1.0000 | ORAL_TABLET | Freq: Once | ORAL | Status: AC
  Administered 2017-03-01: 1 via ORAL
  Filled 2017-03-01: qty 1

## 2017-03-01 MED ORDER — HYDROCODONE-ACETAMINOPHEN 5-325 MG PO TABS: ORAL_TABLET | ORAL | 0 refills | Status: DC

## 2017-03-01 MED ORDER — IBUPROFEN 600 MG PO TABS: 600.0000 mg | ORAL_TABLET | Freq: Four times a day (QID) | ORAL | 0 refills | Status: DC | PRN

## 2017-03-01 NOTE — Discharge Instructions (Signed)
Use a small pillow or folded towel held to your side to cough and take deep breaths several times throughout the day.  You may find that sleeping in a recliner for a few days is more comfortable.  Use your spirometer for deep breathing.  Return to ER for any worsening symptoms

## 2017-03-01 NOTE — ED Notes (Signed)
Pt adds she's two weeks late for her period.
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
## 2017-03-12 ENCOUNTER — Emergency Department (HOSPITAL_COMMUNITY)
Admission: EM | Admit: 2017-03-12 | Discharge: 2017-03-12 | Disposition: A | Discharge status: Home / Self Care | Payer: Self-pay | Attending: Emergency Medicine | Admitting: Emergency Medicine

## 2017-03-12 ENCOUNTER — Encounter (HOSPITAL_COMMUNITY): Payer: Self-pay

## 2017-03-12 DIAGNOSIS — Z09 Encounter for follow-up examination after completed treatment for conditions other than malignant neoplasm: Secondary | ICD-10-CM

## 2017-03-12 DIAGNOSIS — F17210 Nicotine dependence, cigarettes, uncomplicated: Secondary | ICD-10-CM | POA: Insufficient documentation

## 2017-03-12 DIAGNOSIS — Z0279 Encounter for issue of other medical certificate: Secondary | ICD-10-CM | POA: Insufficient documentation

## 2017-03-12 NOTE — ED Provider Notes (Signed)
[facility] Provider Note   CSN: [id number] Arrival date & time: 03/12/17  1533     History   Chief Complaint Chief Complaint  Patient presents with  . Follow-up    HPI Renee MilletMegan A English is a 26 y.o. female.  HPI   Presents with female presenting for follow up of a prior rib injury.  Pt suffered a non displaced rib fracture on the L side 2 weeks ago.  She is healing well and now report having no pain, no trouble breathing and no coughing. She is requesting a work note to return to work.  No other complaint  Past Medical History:  Diagnosis Date  . BV (bacterial vaginosis) 01/10/2013  . Chlamydia   . Contraceptive management 10/18/2013  . Postcoital bleeding 01/10/2013  . Renal stones 01/05/2010  . UTI (urinary tract infection)   . Vaginal irritation 10/18/2013    Patient Active Problem List   Diagnosis Date Noted  . Contraceptive management 10/18/2013  . Vaginal irritation 10/18/2013  . BV (bacterial vaginosis) 01/10/2013  . Postcoital bleeding 01/10/2013    History reviewed. No pertinent surgical history.  OB History    No data available       Home Medications    Prior to Admission medications   Medication Sig Start Date End Date Taking? Authorizing Provider  HYDROcodone-acetaminophen (NORCO/VICODIN) 5-325 MG tablet Take one tab po q 4-6 hrs prn pain 03/01/17   Triplett, Tammy, PA-C  ibuprofen (ADVIL,MOTRIN) 600 MG tablet Take 1 tablet (600 mg total) by mouth every 6 (six) hours as needed. 03/01/17   Triplett, Tammy, PA-C  magic mouthwash w/lidocaine SOLN Take 10 mLs by mouth 4 (four) times daily as needed (throat pain). Note to pharmacy - equal parts diphendydramine, aluminum hydroxide and lidocaine HCL Patient not taking: Reported on 03/01/2017 11/16/16   Burgess AmorIdol, Julie, PA-C    Family History Family History  Problem Relation Age of Onset  . Diabetes Maternal Grandmother   . Hypertension Maternal Grandmother   . Cancer Maternal Grandmother        breast    . Hypertension Maternal Grandfather   . Diabetes Maternal Grandfather   . Cancer Maternal Aunt        breast    Social History Social History  Substance Use Topics  . Smoking status: Current Every Day Smoker    Packs/day: 0.25    Types: Cigarettes  . Smokeless tobacco: Never Used  . Alcohol use Yes     Comment: occ.     Allergies   Sulfa antibiotics; Sesame oil; and Shellfish allergy   Review of Systems Review of Systems  Constitutional: Negative for fatigue and fever.  Respiratory: Negative for cough, shortness of breath and wheezing.   Cardiovascular: Negative for chest pain.  Gastrointestinal: Negative for abdominal pain.  Neurological: Negative for numbness and headaches.     Physical Exam Updated Vital Signs BP 117/69 (BP Location: Right Arm)   Pulse (!) 59   Temp 98.2 F (36.8 C) (Oral)   Wt 49.9 kg (110 lb)   LMP 03/01/2017   SpO2 98%   BMI 20.12 kg/m   Physical Exam  Constitutional: She appears well-developed and well-nourished. No distress.  HENT:  Head: Atraumatic.  Eyes: Conjunctivae are normal.  Neck: Neck supple.  Cardiovascular: Normal rate and regular rhythm.   Pulmonary/Chest: Effort normal and breath sounds normal. She exhibits no tenderness.  Abdominal: Soft. She exhibits no distension. There is no tenderness.  Neurological: She is alert.  Skin:  No rash noted.  Psychiatric: She has a normal mood and affect.  Nursing note and vitals reviewed.    ED Treatments / Results  Labs (all labs ordered are listed, but only abnormal results are displayed) Labs Reviewed - No data to display  EKG  EKG Interpretation None       Radiology No results found.  Procedures Procedures (including critical care time)  Medications Ordered in ED Medications - No data to display   Initial Impression / Assessment and Plan / ED Course  I have reviewed the triage vital signs and the nursing notes.  Pertinent labs & imaging results that were  available during my care of the patient were reviewed by me and considered in my medical decision making (see chart for details).     BP 117/69 (BP Location: Right Arm)   Pulse (!) 59   Temp 98.2 F (36.8 C) (Oral)   Wt 49.9 kg (110 lb)   LMP 03/01/2017   SpO2 98%   BMI 20.12 kg/m    Final Clinical Impressions(s) / ED Diagnoses   Final diagnoses:  Follow-up exam    New Prescriptions New Prescriptions   No medications on file   6:18 PM Pt suffered 1 nondisplaced closed rib fx 2 weeks ago.  Now requesting to return to work as she has fully healed. Work note provided.    Fayrene Helper, PA-C 03/12/17 1819    Donnetta Hutching, MD 03/13/17 2112

## 2017-03-12 NOTE — Discharge Instructions (Signed)
You may resume normal working condition.
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
## 2018-10-14 ENCOUNTER — Emergency Department (HOSPITAL_COMMUNITY): Payer: Self-pay

## 2018-10-14 ENCOUNTER — Other Ambulatory Visit: Payer: Self-pay

## 2018-10-14 ENCOUNTER — Emergency Department (HOSPITAL_COMMUNITY)
Admission: EM | Admit: 2018-10-14 | Discharge: 2018-10-14 | Disposition: A | Discharge status: Home / Self Care | Payer: Self-pay | Attending: Emergency Medicine | Admitting: Emergency Medicine

## 2018-10-14 ENCOUNTER — Encounter (HOSPITAL_COMMUNITY): Payer: Self-pay | Admitting: Emergency Medicine

## 2018-10-14 DIAGNOSIS — Z79899 Other long term (current) drug therapy: Secondary | ICD-10-CM | POA: Insufficient documentation

## 2018-10-14 DIAGNOSIS — R300 Dysuria: Secondary | ICD-10-CM | POA: Insufficient documentation

## 2018-10-14 DIAGNOSIS — F17210 Nicotine dependence, cigarettes, uncomplicated: Secondary | ICD-10-CM | POA: Insufficient documentation

## 2018-10-14 DIAGNOSIS — R102 Pelvic and perineal pain: Secondary | ICD-10-CM | POA: Insufficient documentation

## 2018-10-14 DIAGNOSIS — R1031 Right lower quadrant pain: Secondary | ICD-10-CM | POA: Insufficient documentation

## 2018-10-14 LAB — COMPREHENSIVE METABOLIC PANEL
ALT: 18 U/L (ref 0–44)
AST: 25 U/L (ref 15–41)
Albumin: 4.7 g/dL (ref 3.5–5.0)
Alkaline Phosphatase: 62 U/L (ref 38–126)
Anion gap: 11 (ref 5–15)
BUN: 18 mg/dL (ref 6–20)
CO2: 22 mmol/L (ref 22–32)
Calcium: 9.3 mg/dL (ref 8.9–10.3)
Chloride: 108 mmol/L (ref 98–111)
Creatinine, Ser: 0.69 mg/dL (ref 0.44–1.00)
GFR calc Af Amer: >60 mL/min (ref >60)
GFR calc non Af Amer: >60 mL/min (ref >60)
Glucose, Bld: 109 mg/dL — ABNORMAL HIGH (ref 70–99)
Potassium: 3.7 mmol/L (ref 3.5–5.1)
Sodium: 141 mmol/L (ref 135–145)
Total Bilirubin: 0.7 mg/dL (ref 0.3–1.2)
Total Protein: 7.4 g/dL (ref 6.5–8.1)

## 2018-10-14 LAB — URINALYSIS, ROUTINE W REFLEX MICROSCOPIC
Bilirubin Urine: NEGATIVE
Glucose, UA: 250 mg/dL — AB
Hgb urine dipstick: NEGATIVE
Ketones, ur: NEGATIVE mg/dL
Nitrite: POSITIVE — AB
Protein, ur: 30 mg/dL — AB
Specific Gravity, Urine: <1.005 — ABNORMAL LOW (ref 1.005–1.030)
pH: 5.5 (ref 5.0–8.0)

## 2018-10-14 LAB — POC URINE PREG, ED: Preg Test, Ur: NEGATIVE

## 2018-10-14 LAB — CBC WITH DIFFERENTIAL/PLATELET
Abs Immature Granulocytes: 0.01 10*3/uL (ref 0.00–0.07)
Basophils Absolute: 0.1 10*3/uL (ref 0.0–0.1)
Basophils Relative: 1 %
Eosinophils Absolute: 0.1 10*3/uL (ref 0.0–0.5)
Eosinophils Relative: 1 %
HCT: 44.4 % (ref 36.0–46.0)
Hemoglobin: 14.1 g/dL (ref 12.0–15.0)
Immature Granulocytes: 0 %
Lymphocytes Relative: 27 %
Lymphs Abs: 1.6 10*3/uL (ref 0.7–4.0)
MCH: 30.7 pg (ref 26.0–34.0)
MCHC: 31.8 g/dL (ref 30.0–36.0)
MCV: 96.7 fL (ref 80.0–100.0)
Monocytes Absolute: 0.6 10*3/uL (ref 0.1–1.0)
Monocytes Relative: 9 %
Neutro Abs: 3.6 10*3/uL (ref 1.7–7.7)
Neutrophils Relative %: 62 %
Platelets: 312 10*3/uL (ref 150–400)
RBC: 4.59 MIL/uL (ref 3.87–5.11)
RDW: 12.2 % (ref 11.5–15.5)
WBC: 5.9 10*3/uL (ref 4.0–10.5)
nRBC: 0 % (ref 0.0–0.2)

## 2018-10-14 LAB — WET PREP, GENITAL
Clue Cells Wet Prep HPF POC: NONE SEEN
Sperm: NONE SEEN
Trich, Wet Prep: NONE SEEN

## 2018-10-14 LAB — URINALYSIS, MICROSCOPIC (REFLEX)

## 2018-10-14 MED ORDER — CEPHALEXIN 500 MG PO CAPS: 500.0000 mg | ORAL_CAPSULE | Freq: Four times a day (QID) | ORAL | 0 refills | Status: DC

## 2018-10-14 MED ORDER — ONDANSETRON HCL 4 MG/2ML IJ SOLN
4.0000 mg | Freq: Once | INTRAMUSCULAR | Status: AC
Start: 2018-10-14 — End: 2018-10-14
  Administered 2018-10-14: 14:00:00 4 mg via INTRAVENOUS
  Filled 2018-10-14: qty 2

## 2018-10-14 MED ORDER — HYDROMORPHONE HCL 1 MG/ML IJ SOLN
0.5000 mg | Freq: Once | INTRAMUSCULAR | Status: AC
  Administered 2018-10-14: 0.5 mg via INTRAVENOUS
  Filled 2018-10-14: qty 1

## 2018-10-14 MED ORDER — MORPHINE SULFATE (PF) 4 MG/ML IV SOLN
4.0000 mg | Freq: Once | INTRAVENOUS | Status: AC
  Administered 2018-10-14: 14:00:00 4 mg via INTRAVENOUS
  Filled 2018-10-14: qty 1

## 2018-10-14 MED ORDER — SODIUM CHLORIDE 0.9 % IV BOLUS
1000.0000 mL | Freq: Once | INTRAVENOUS | Status: AC
  Administered 2018-10-14: 1000 mL via INTRAVENOUS

## 2018-10-14 MED ORDER — MORPHINE SULFATE (PF) 2 MG/ML IV SOLN
2.0000 mg | Freq: Once | INTRAVENOUS | Status: AC
  Administered 2018-10-14: 16:00:00 2 mg via INTRAVENOUS
  Filled 2018-10-14: qty 1

## 2018-10-14 MED ORDER — IOHEXOL 300 MG/ML  SOLN
100.0000 mL | Freq: Once | INTRAMUSCULAR | Status: AC | PRN
  Administered 2018-10-14: 18:00:00 100 mL via INTRAVENOUS

## 2018-10-14 MED ORDER — TRAMADOL HCL 50 MG PO TABS: 50.0000 mg | ORAL_TABLET | Freq: Four times a day (QID) | ORAL | 0 refills | Status: DC | PRN

## 2018-10-14 NOTE — ED Provider Notes (Signed)
1630  Patient with RLQ pain and signed out to me by Burgess AmorJulie Idol, PA-C, awaiting US results.      [place] Transvaginal Non-ob  Result Date: 10/14/2018 CLINICAL DATA:  RIGHT-sided pain with urination for 2 days. EXAM: TRANSABDOMINAL AND TRANSVAGINAL ULTRASOUND OF PELVIS DOPPLER ULTRASOUND OF OVARIES TECHNIQUE: Both transabdominal and transvaginal ultrasound examinations of the pelvis were performed. Transabdominal technique was performed for global imaging of the pelvis including uterus, ovaries, adnexal regions, and pelvic cul-de-sac. It was necessary to proceed with endovaginal exam following the transabdominal exam to visualize the endometrial complex and adnexal structures. Color and duplex Doppler ultrasound was utilized to evaluate blood flow to the ovaries. COMPARISON:  None. FINDINGS: Uterus Measurements: 8.6 x 3.3 x 4.8 cm = volume: 71 mL. No fibroids or other mass visualized. Endometrium Thickness: 4 mm. No focal abnormality visualized. Small amount of fluid within the endometrial canal extending into the endocervical canal, of unlikely clinical significance. Right ovary Measurements: 1.8 x 1.2 x 1.1 cm = volume: 1.3 mL. Normal appearance/no adnexal mass. Left ovary Measurements: 1.7 x 1.2 x 1.5 cm = volume: 1.6 mL. Normal appearance/no adnexal mass. Pulsed Doppler evaluation of both ovaries demonstrates normal low-resistance arterial and venous waveforms. Other findings No abnormal free fluid. IMPRESSION: No acute or significant findings. No evidence of ovarian torsion. No mass or free fluid within either adnexal region. Electronically Signed   By: Bary RichardStan  Maynard M.D.   On: 10/14/2018 17:01   [place] Pelvis Complete  Result Date: 10/14/2018 CLINICAL DATA:  RIGHT-sided pain with urination for 2 days. EXAM: TRANSABDOMINAL AND TRANSVAGINAL ULTRASOUND OF PELVIS DOPPLER ULTRASOUND OF OVARIES TECHNIQUE: Both transabdominal and transvaginal ultrasound examinations of the pelvis were performed. Transabdominal  technique was performed for global imaging of the pelvis including uterus, ovaries, adnexal regions, and pelvic cul-de-sac. It was necessary to proceed with endovaginal exam following the transabdominal exam to visualize the endometrial complex and adnexal structures. Color and duplex Doppler ultrasound was utilized to evaluate blood flow to the ovaries. COMPARISON:  None. FINDINGS: Uterus Measurements: 8.6 x 3.3 x 4.8 cm = volume: 71 mL. No fibroids or other mass visualized. Endometrium Thickness: 4 mm. No focal abnormality visualized. Small amount of fluid within the endometrial canal extending into the endocervical canal, of unlikely clinical significance. Right ovary Measurements: 1.8 x 1.2 x 1.1 cm = volume: 1.3 mL. Normal appearance/no adnexal mass. Left ovary Measurements: 1.7 x 1.2 x 1.5 cm = volume: 1.6 mL. Normal appearance/no adnexal mass. Pulsed Doppler evaluation of both ovaries demonstrates normal low-resistance arterial and venous waveforms. Other findings No abnormal free fluid. IMPRESSION: No acute or significant findings. No evidence of ovarian torsion. No mass or free fluid within either adnexal region. Electronically Signed   By: Bary RichardStan  Maynard M.D.   On: 10/14/2018 17:01   Ct Abdomen Pelvis W Contrast  Result Date: 10/14/2018 CLINICAL DATA:  Acute right lower quadrant abdominal pain. EXAM: CT ABDOMEN AND PELVIS WITH CONTRAST TECHNIQUE: Multidetector CT imaging of the abdomen and pelvis was performed using the standard protocol following bolus administration of intravenous contrast. CONTRAST:  100mL OMNIPAQUE IOHEXOL 300 MG/ML  SOLN COMPARISON:  CT scan of May 22, 2015. FINDINGS: Lower chest: No acute abnormality. Hepatobiliary: No focal liver abnormality is seen. No gallstones, gallbladder wall thickening, or biliary dilatation. Pancreas: Unremarkable. No pancreatic ductal dilatation or surrounding inflammatory changes. Spleen: Normal in size without focal abnormality. Adrenals/Urinary  Tract: Adrenal glands are unremarkable. Kidneys are normal, without renal calculi, focal lesion, or hydronephrosis. Bladder  is unremarkable. Stomach/Bowel: Stomach is within normal limits. Appendix appears normal. No evidence of bowel wall thickening, distention, or inflammatory changes. Vascular/Lymphatic: No significant vascular findings are present. No enlarged abdominal or pelvic lymph nodes. Reproductive: Uterus and bilateral adnexa are unremarkable. Other: No abdominal wall hernia or abnormality. No abdominopelvic ascites. Musculoskeletal: No acute or significant osseous findings. IMPRESSION: No abnormality seen in the abdomen or pelvis. Electronically Signed   By: Lupita Raider, M.D.   On: 10/14/2018 18:37   [place] Art/ven Flow Abd Pelv Doppler  Result Date: 10/14/2018 CLINICAL DATA:  RIGHT-sided pain with urination for 2 days. EXAM: TRANSABDOMINAL AND TRANSVAGINAL ULTRASOUND OF PELVIS DOPPLER ULTRASOUND OF OVARIES TECHNIQUE: Both transabdominal and transvaginal ultrasound examinations of the pelvis were performed. Transabdominal technique was performed for global imaging of the pelvis including uterus, ovaries, adnexal regions, and pelvic cul-de-sac. It was necessary to proceed with endovaginal exam following the transabdominal exam to visualize the endometrial complex and adnexal structures. Color and duplex Doppler ultrasound was utilized to evaluate blood flow to the ovaries. COMPARISON:  None. FINDINGS: Uterus Measurements: 8.6 x 3.3 x 4.8 cm = volume: 71 mL. No fibroids or other mass visualized. Endometrium Thickness: 4 mm. No focal abnormality visualized. Small amount of fluid within the endometrial canal extending into the endocervical canal, of unlikely clinical significance. Right ovary Measurements: 1.8 x 1.2 x 1.1 cm = volume: 1.3 mL. Normal appearance/no adnexal mass. Left ovary Measurements: 1.7 x 1.2 x 1.5 cm = volume: 1.6 mL. Normal appearance/no adnexal mass. Pulsed Doppler evaluation of  both ovaries demonstrates normal low-resistance arterial and venous waveforms. Other findings No abnormal free fluid. IMPRESSION: No acute or significant findings. No evidence of ovarian torsion. No mass or free fluid within either adnexal region. Electronically Signed   By: Bary Richard M.D.   On: 10/14/2018 17:01     1730  On recheck, pt continues to c/o of pain, IV Dilaudid ordered.  US pelvis and CT abdomen/pelvis are reassuring.  Pt is feeling better.  She reports some urinary frequency and voiding smaller amounts than usual, urine culture is pending and given her current symptoms, I will start Keflex.  I feel that she is appropriate for d/c home and I have discussed strict return precautions and she verbalized understanding.      Pauline Aus, PA-C 10/14/18 2242    Donnetta Hutching, MD 10/15/18 [phone]

## 2018-10-14 NOTE — ED Provider Notes (Signed)
[facility] EMERGENCY DEPARTMENT Provider Note   CSN: [id number] Arrival date & time: 10/14/18  1347    History   Chief Complaint Chief Complaint  Patient presents with  . Flank Pain    HPI Renee English is a 28 y.o. female with a history of kidney stones, uti, and distant history of std's presenting with a 2 day history of right lower quadrant pain which woke her from sleep 2 nights ago.  She reports constant, sharp stabbing pain which is worsening. She has had no n/v/d, no fevers or chills but does endorse reduced appetite.  She reports urinary frequency but denies hematuria, painful urination and flank pain.  She also denies vaginal discharge.  Her pain is worsened with movement and with attempts to lie on either side. She has taken ibuprofen and tylenol, most recently tylenol today without relief. She also took a dose of azo without improvement.  Denies risk factors for stds. LMP over 1 year ago, on depo.      The history is provided by the patient.    Past Medical History:  Diagnosis Date  . BV (bacterial vaginosis) 01/10/2013  . Chlamydia   . Contraceptive management 10/18/2013  . Postcoital bleeding 01/10/2013  . Renal stones 01/05/2010  . UTI (urinary tract infection)   . Vaginal irritation 10/18/2013    Patient Active Problem List   Diagnosis Date Noted  . Contraceptive management 10/18/2013  . Vaginal irritation 10/18/2013  . BV (bacterial vaginosis) 01/10/2013  . Postcoital bleeding 01/10/2013    History reviewed. No pertinent surgical history.   OB History   No obstetric history on file.      Home Medications    Prior to Admission medications   Medication Sig Start Date End Date Taking? Authorizing Provider  Acetaminophen (TYLENOL PO) Take 1 capsule by mouth daily as needed (pain).   Yes [provider]  ibuprofen (ADVIL,MOTRIN) 600 MG tablet Take 1 tablet (600 mg total) by mouth every 6 (six) hours as needed. 03/01/17  Yes Triplett, Tammy, PA-C   medroxyPROGESTERone (DEPO-PROVERA) 150 MG/ML injection Inject 150 mg into the muscle every 3 (three) months.   Yes [provider]  Phenazopyridine HCl (AZO TABS PO) Take 1 tablet by mouth daily as needed.   Yes [provider]  HYDROcodone-acetaminophen (NORCO/VICODIN) 5-325 MG tablet Take one tab po q 4-6 hrs prn pain Patient not taking: Reported on 10/14/2018 03/01/17   Pauline Aus, PA-C  magic mouthwash w/lidocaine SOLN Take 10 mLs by mouth 4 (four) times daily as needed (throat pain). Note to pharmacy - equal parts diphendydramine, aluminum hydroxide and lidocaine HCL Patient not taking: Reported on 03/01/2017 11/16/16   Burgess Amor, PA-C    Family History Family History  Problem Relation Age of Onset  . Diabetes Maternal Grandmother   . Hypertension Maternal Grandmother   . Cancer Maternal Grandmother        breast  . Hypertension Maternal Grandfather   . Diabetes Maternal Grandfather   . Cancer Maternal Aunt        breast    Social History Social History   Tobacco Use  . Smoking status: Current Every Day Smoker    Packs/day: 0.25    Types: Cigarettes  . Smokeless tobacco: Never Used  Substance Use Topics  . Alcohol use: Yes    Comment: occ.  . Drug use: No    Comment: pt former cocaine user      Allergies   Sulfa antibiotics; Sesame oil;  and Shellfish allergy   Review of Systems Review of Systems  Constitutional: Negative for chills and fever.  HENT: Negative for congestion and sore throat.   Eyes: Negative.   Respiratory: Negative for chest tightness and shortness of breath.   Cardiovascular: Negative for chest pain.  Gastrointestinal: Negative for abdominal distention, abdominal pain, constipation, diarrhea, nausea and vomiting.       Denies flank pain  Genitourinary: Positive for frequency and pelvic pain. Negative for decreased urine volume, dysuria, vaginal bleeding, vaginal discharge and vaginal pain.  Musculoskeletal: Negative for  arthralgias, joint swelling and neck pain.  Skin: Negative.  Negative for rash and wound.  Neurological: Negative for dizziness, weakness, light-headedness, numbness and headaches.  Psychiatric/Behavioral: Negative.      Physical Exam Updated Vital Signs BP 119/72   Pulse 97   Temp 97.8 F (36.6 C) (Oral)   Resp 18   Ht 5\' 2"  (1.575 m)   Wt 68 kg   SpO2 99%   BMI 27.44 kg/m   Physical Exam Vitals signs and nursing note reviewed. Exam conducted with a chaperone present.  Constitutional:      Appearance: She is well-developed.  HENT:     Head: Normocephalic and atraumatic.  Eyes:     Conjunctiva/sclera: Conjunctivae normal.  Neck:     Musculoskeletal: Normal range of motion.  Cardiovascular:     Rate and Rhythm: Normal rate and regular rhythm.     Heart sounds: Normal heart sounds.  Pulmonary:     Effort: Pulmonary effort is normal.     Breath sounds: Normal breath sounds. No wheezing.  Abdominal:     General: Bowel sounds are normal.     Palpations: Abdomen is soft.     Tenderness: There is abdominal tenderness in the right lower quadrant. There is no right CVA tenderness, left CVA tenderness, guarding or rebound. Positive signs include psoas sign. Negative signs include Murphy's sign.  Genitourinary:    Cervix: No cervical motion tenderness, friability or erythema.     Uterus: Normal. Not tender.      Adnexa:        Right: Tenderness and fullness present.      Comments: Scant white vaginal dc.  Musculoskeletal: Normal range of motion.  Skin:    General: Skin is warm and dry.  Neurological:     Mental Status: She is alert.      ED Treatments / Results  Labs (all labs ordered are listed, but only abnormal results are displayed) Labs Reviewed  WET PREP, GENITAL - Abnormal; Notable for the following components:      Result Value   Yeast Wet Prep HPF POC PRESENT (*)    WBC, Wet Prep HPF POC FEW (*)    All other components within normal limits  URINALYSIS,  ROUTINE W REFLEX MICROSCOPIC - Abnormal; Notable for the following components:   Color, Urine ORANGE (*)    Specific Gravity, Urine <1.005 (*)    Glucose, UA 250 (*)    Protein, ur 30 (*)    Nitrite POSITIVE (*)    Leukocytes,Ua TRACE (*)    All other components within normal limits  COMPREHENSIVE METABOLIC PANEL - Abnormal; Notable for the following components:   Glucose, Bld 109 (*)    All other components within normal limits  URINALYSIS, MICROSCOPIC (REFLEX) - Abnormal; Notable for the following components:   Bacteria, UA FEW (*)    All other components within normal limits  URINE CULTURE  CBC WITH DIFFERENTIAL/PLATELET  POC URINE PREG, ED  GC/CHLAMYDIA PROBE AMP ([place]) NOT AT [facility]    EKG None  Radiology No results found.  Procedures Procedures (including critical care time)  Medications Ordered in ED Medications  morphine 2 MG/ML injection 2 mg (has no administration in time range)  sodium chloride 0.9 % bolus 1,000 mL (1,000 mLs Intravenous New Bag/Given 10/14/18 1423)  morphine 4 MG/ML injection 4 mg (4 mg Intravenous Given 10/14/18 1423)  ondansetron (ZOFRAN) injection 4 mg (4 mg Intravenous Given 10/14/18 1423)     Initial Impression / Assessment and Plan / ED Course  I have reviewed the triage vital signs and the nursing notes.  Pertinent labs & imaging results that were available during my care of the patient were reviewed by me and considered in my medical decision making (see chart for details).        Pt with right pelvic pain, ttp over right adnexa on pelvic exam.  Will US to rule out torsion vs other ovarian/adnexal pathology.  UA pending, less likely kidney stone given no flank pain, if no rbc's in UA, doubt this possibility.  If US unremarkable, may need to consider CT to rule out appy, although there is no leukocytosis at this time.  She was given morphine 4 mg IV, given additional 2 mg dose after pelvic exam secondary to exacerbated pain with  exam.  US pending currently. Discussed with Pauline Ausammy Triplett PA who assumes care.     Final Clinical Impressions(s) / ED Diagnoses   Final diagnoses:  None    ED Discharge Orders    None       Victoriano Laindol, Elli Groesbeck, PA-C 10/14/18 1621    Linwood DibblesKnapp, Jon, MD 10/15/18 [phone]

## 2018-10-14 NOTE — Discharge Instructions (Signed)
Take the antibiotic as directed.  Return to Er for any worsening symptoms such as increasing pain, fever or vomiting.

## 2018-10-14 NOTE — ED Triage Notes (Signed)
Pt c/o of right sided flank pain with increased urination

## 2018-10-15 LAB — URINE CULTURE
Culture: NO GROWTH
Special Requests: NORMAL

## 2018-10-16 LAB — GC/CHLAMYDIA PROBE AMP (~~LOC~~) NOT AT ARMC
Chlamydia: NEGATIVE
Neisseria Gonorrhea: NEGATIVE

## 2018-11-05 ENCOUNTER — Emergency Department (HOSPITAL_COMMUNITY)
Admission: EM | Admit: 2018-11-05 | Discharge: 2018-11-06 | Disposition: A | Discharge status: Other Acute Inpatient Hospital | Payer: Self-pay | Attending: Emergency Medicine | Admitting: Emergency Medicine

## 2018-11-05 ENCOUNTER — Other Ambulatory Visit: Payer: Self-pay

## 2018-11-05 ENCOUNTER — Encounter (HOSPITAL_COMMUNITY): Payer: Self-pay | Admitting: Emergency Medicine

## 2018-11-05 DIAGNOSIS — Y999 Unspecified external cause status: Secondary | ICD-10-CM | POA: Insufficient documentation

## 2018-11-05 DIAGNOSIS — F332 Major depressive disorder, recurrent severe without psychotic features: Secondary | ICD-10-CM | POA: Insufficient documentation

## 2018-11-05 DIAGNOSIS — Y939 Activity, unspecified: Secondary | ICD-10-CM | POA: Insufficient documentation

## 2018-11-05 DIAGNOSIS — R4589 Other symptoms and signs involving emotional state: Secondary | ICD-10-CM

## 2018-11-05 DIAGNOSIS — X789XXA Intentional self-harm by unspecified sharp object, initial encounter: Secondary | ICD-10-CM | POA: Insufficient documentation

## 2018-11-05 DIAGNOSIS — Y929 Unspecified place or not applicable: Secondary | ICD-10-CM | POA: Insufficient documentation

## 2018-11-05 DIAGNOSIS — R4689 Other symptoms and signs involving appearance and behavior: Secondary | ICD-10-CM

## 2018-11-05 DIAGNOSIS — R45851 Suicidal ideations: Secondary | ICD-10-CM | POA: Insufficient documentation

## 2018-11-05 DIAGNOSIS — F1099 Alcohol use, unspecified with unspecified alcohol-induced disorder: Secondary | ICD-10-CM | POA: Insufficient documentation

## 2018-11-05 LAB — I-STAT BETA HCG BLOOD, ED (MC, WL, AP ONLY): I-stat hCG, quantitative: <5 m[IU]/mL (ref <5)

## 2018-11-05 MED ORDER — LIDOCAINE-EPINEPHRINE (PF) 1 %-1:200000 IJ SOLN
10.0000 mL | Freq: Once | INTRAMUSCULAR | Status: AC
  Administered 2018-11-06: 10 mL via INTRADERMAL
  Filled 2018-11-05: qty 30

## 2018-11-05 NOTE — ED Triage Notes (Signed)
Having suicidal thoughts for the past 3 days.  Has been having arguments with her boyfriend recently.  Reports drinking the past 3 days and has taken 1 xanax from a friend.  Pt has cut to Rt forearm with no active bleeding.  Reports this is the first time she has ever cut herself.  Pt reports she wanted to cut her wrist to kill herself.

## 2018-11-05 NOTE — ED Notes (Signed)
Lab in room.

## 2018-11-05 NOTE — ED Notes (Signed)
Patient currently being wanded by security.  

## 2018-11-05 NOTE — ED Notes (Addendum)
Belongings (including clothes, shoes, cigarettes, iphone with id, apple watch, charger, earrings, bracelets, rings, necklace, and keys) placed in 3rd row, 3rd locker.   Unable to get nose ring out. Left in nostril

## 2018-11-06 ENCOUNTER — Encounter: Payer: Self-pay | Admitting: Psychiatry

## 2018-11-06 ENCOUNTER — Inpatient Hospital Stay
Admission: AD | Admit: 2018-11-06 | Discharge: 2018-11-08 | DRG: 885 | Disposition: A | Discharge status: Home / Self Care | Payer: No Typology Code available for payment source | Source: Intra-hospital | Attending: Psychiatry | Admitting: Psychiatry

## 2018-11-06 DIAGNOSIS — Z833 Family history of diabetes mellitus: Secondary | ICD-10-CM

## 2018-11-06 DIAGNOSIS — Z915 Personal history of self-harm: Secondary | ICD-10-CM

## 2018-11-06 DIAGNOSIS — Z8249 Family history of ischemic heart disease and other diseases of the circulatory system: Secondary | ICD-10-CM | POA: Diagnosis not present

## 2018-11-06 DIAGNOSIS — F1010 Alcohol abuse, uncomplicated: Secondary | ICD-10-CM | POA: Diagnosis present

## 2018-11-06 DIAGNOSIS — Z91018 Allergy to other foods: Secondary | ICD-10-CM | POA: Diagnosis not present

## 2018-11-06 DIAGNOSIS — F17210 Nicotine dependence, cigarettes, uncomplicated: Secondary | ICD-10-CM | POA: Diagnosis present

## 2018-11-06 DIAGNOSIS — Z91013 Allergy to seafood: Secondary | ICD-10-CM | POA: Diagnosis not present

## 2018-11-06 DIAGNOSIS — Z803 Family history of malignant neoplasm of breast: Secondary | ICD-10-CM

## 2018-11-06 DIAGNOSIS — F322 Major depressive disorder, single episode, severe without psychotic features: Secondary | ICD-10-CM | POA: Diagnosis present

## 2018-11-06 DIAGNOSIS — Z87442 Personal history of urinary calculi: Secondary | ICD-10-CM | POA: Diagnosis not present

## 2018-11-06 DIAGNOSIS — R4589 Other symptoms and signs involving emotional state: Secondary | ICD-10-CM

## 2018-11-06 DIAGNOSIS — Z91410 Personal history of adult physical and sexual abuse: Secondary | ICD-10-CM | POA: Diagnosis not present

## 2018-11-06 DIAGNOSIS — Z882 Allergy status to sulfonamides status: Secondary | ICD-10-CM | POA: Diagnosis not present

## 2018-11-06 DIAGNOSIS — R4689 Other symptoms and signs involving appearance and behavior: Secondary | ICD-10-CM

## 2018-11-06 DIAGNOSIS — T1491XA Suicide attempt, initial encounter: Secondary | ICD-10-CM | POA: Diagnosis present

## 2018-11-06 LAB — COMPREHENSIVE METABOLIC PANEL
ALT: 21 U/L (ref 0–44)
AST: 21 U/L (ref 15–41)
Albumin: 4.7 g/dL (ref 3.5–5.0)
Alkaline Phosphatase: 79 U/L (ref 38–126)
Anion gap: 14 (ref 5–15)
BUN: 11 mg/dL (ref 6–20)
CO2: 20 mmol/L — ABNORMAL LOW (ref 22–32)
Calcium: 9.2 mg/dL (ref 8.9–10.3)
Chloride: 107 mmol/L (ref 98–111)
Creatinine, Ser: 0.63 mg/dL (ref 0.44–1.00)
GFR calc Af Amer: >60 mL/min (ref >60)
GFR calc non Af Amer: >60 mL/min (ref >60)
Glucose, Bld: 102 mg/dL — ABNORMAL HIGH (ref 70–99)
Potassium: 3.7 mmol/L (ref 3.5–5.1)
Sodium: 141 mmol/L (ref 135–145)
Total Bilirubin: 0.2 mg/dL — ABNORMAL LOW (ref 0.3–1.2)
Total Protein: 8.2 g/dL — ABNORMAL HIGH (ref 6.5–8.1)

## 2018-11-06 LAB — CBC WITH DIFFERENTIAL/PLATELET
Abs Immature Granulocytes: 0.02 10*3/uL (ref 0.00–0.07)
Basophils Absolute: 0.1 10*3/uL (ref 0.0–0.1)
Basophils Relative: 1 %
Eosinophils Absolute: 0 10*3/uL (ref 0.0–0.5)
Eosinophils Relative: 0 %
HCT: 45.5 % (ref 36.0–46.0)
Hemoglobin: 15.1 g/dL — ABNORMAL HIGH (ref 12.0–15.0)
Immature Granulocytes: 0 %
Lymphocytes Relative: 28 %
Lymphs Abs: 2.2 10*3/uL (ref 0.7–4.0)
MCH: 30.8 pg (ref 26.0–34.0)
MCHC: 33.2 g/dL (ref 30.0–36.0)
MCV: 92.7 fL (ref 80.0–100.0)
Monocytes Absolute: 0.5 10*3/uL (ref 0.1–1.0)
Monocytes Relative: 6 %
Neutro Abs: 5 10*3/uL (ref 1.7–7.7)
Neutrophils Relative %: 65 %
Platelets: 375 10*3/uL (ref 150–400)
RBC: 4.91 MIL/uL (ref 3.87–5.11)
RDW: 12.1 % (ref 11.5–15.5)
WBC: 7.8 10*3/uL (ref 4.0–10.5)
nRBC: 0 % (ref 0.0–0.2)

## 2018-11-06 LAB — RAPID URINE DRUG SCREEN, HOSP PERFORMED
Amphetamines: NOT DETECTED
Barbiturates: NOT DETECTED
Benzodiazepines: NOT DETECTED
Cocaine: NOT DETECTED
Opiates: NOT DETECTED
Tetrahydrocannabinol: NOT DETECTED

## 2018-11-06 LAB — ETHANOL: Alcohol, Ethyl (B): 234 mg/dL — ABNORMAL HIGH (ref <10)

## 2018-11-06 MED ORDER — HYDROXYZINE HCL 25 MG PO TABS: 25.0000 mg | ORAL_TABLET | Freq: Three times a day (TID) | ORAL | Status: DC | PRN

## 2018-11-06 MED ORDER — ALUM & MAG HYDROXIDE-SIMETH 200-200-20 MG/5ML PO SUSP: 30.0000 mL | ORAL | Status: DC | PRN

## 2018-11-06 MED ORDER — NICOTINE 14 MG/24HR TD PT24: 14.0000 mg | MEDICATED_PATCH | Freq: Every day | TRANSDERMAL | Status: DC

## 2018-11-06 MED ORDER — ACETAMINOPHEN 325 MG PO TABS: 650.0000 mg | ORAL_TABLET | Freq: Four times a day (QID) | ORAL | Status: DC | PRN

## 2018-11-06 MED ORDER — MAGNESIUM HYDROXIDE 400 MG/5ML PO SUSP: 30.0000 mL | Freq: Every day | ORAL | Status: DC | PRN

## 2018-11-06 NOTE — Progress Notes (Signed)
[facility] now says they are unable to accept patient today as the acuity on the BMU is too high.  Patient continue to meet inpatient criteria per Nira Conn, NP.  Patient referrals sent to the following:  [facility]  [facility]  [facility]  [facility]  [facility]  [facility]  [facility]  [facility]  [facility]  [facility]  [facility]  [facility]     Disposition CSW to continue to follow patient for placement.  Timmothy Euler. Kaylyn Lim, MSW, LCSW Disposition Clinical Social Work [phone] (cell) [phone] (office)

## 2018-11-06 NOTE — Tx Team (Signed)
Initial Treatment Plan 11/06/2018 8:23 PM Malley A Venuto AST:[id number]    PATIENT STRESSORS: Marital or family conflict Substance abuse   PATIENT STRENGTHS: Ability for insight Active sense of humor Average or above average intelligence Capable of independent living Metallurgist fund of knowledge Motivation for treatment/growth Physical Health Religious Affiliation Special hobby/interest Supportive family/friends Work skills   PATIENT IDENTIFIED PROBLEMS: Marital and family conflict 11/06/2018  Substance Abuse 11/06/2018  Self-harm 11/06/2018                 DISCHARGE CRITERIA:  Improved stabilization in mood, thinking, and/or behavior Motivation to continue treatment in a less acute level of care Need for constant or close observation no longer present Verbal commitment to aftercare and medication compliance  PRELIMINARY DISCHARGE PLAN: Outpatient therapy Participate in family therapy Return to previous living arrangement Return to previous work or school arrangements  PATIENT/FAMILY INVOLVEMENT: This treatment plan has been presented to and reviewed with the patient, Renee English. The patient has been given the opportunity to ask questions and make suggestions.  Lenox Ponds, RN 11/06/2018, 8:23 PM
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
## 2018-11-06 NOTE — ED Notes (Signed)
tts in progress 

## 2018-11-06 NOTE — BH Assessment (Signed)
Patient has been accepted to [facility].  Accepting physician is Dr. Viviano Simas.  Attending Physician will be Dr. Toni Amend.  Patient has been assigned to room 316, by [facility] Charge Nurse T'Yawn.  Call report to [phone].  Representative/Transfer Coordinator is Chijioke Lasser Patient pre-admitted by [facility] Patient Access Sena Slate)  [facility] Staff Carney Bern, TTS) made aware of acceptance.

## 2018-11-06 NOTE — ED Provider Notes (Signed)
Accepted at [facility] - inpatient [facility] - going at this time.  EMTALA done   Eber Hong, MD 11/06/18 1730

## 2018-11-06 NOTE — BHH Group Notes (Signed)
B[facility] Group Notes:  (Nursing/MHT/Case Management/Adjunct)  Date:  11/06/2018  Time:  9:09 PM  Type of Therapy:  Group Therapy  Participation Level:  Did Not Attend  Summary of Progress/Problems:  [name] 11/06/2018, 9:09 PM

## 2018-11-06 NOTE — Plan of Care (Signed)
Patient just recently admitted to the unit. Patient has not had sufficient time to show progressions at this time. Will continue to monitor for progressions.   Problem: Education: Goal: Emotional status will improve Outcome: Not Progressing Goal: Mental status will improve Outcome: Not Progressing   Problem: Health Behavior/Discharge Planning: Goal: Compliance with treatment plan for underlying cause of condition will improve Outcome: Not Progressing   Problem: Safety: Goal: Periods of time without injury will increase Outcome: Not Progressing

## 2018-11-06 NOTE — Progress Notes (Signed)
Patient is to be accepted to [facility].  CSW requested that [facility] RN have patient sign consent for treatment and fax it to [phone].  Acceptance information is pending.  Timmothy Euler. Kaylyn Lim, MSW, LCSW Disposition Clinical Social Work [phone] (cell) [phone] (office)

## 2018-11-06 NOTE — BHH Counselor (Signed)
Clinician attempted to call the TTS cart and received a message saying, "Call ended, please try again later." Clinician to try back in a few minutes.     Redmond Pulling, MS, [facility], [facility] Triage Specialist [phone]

## 2018-11-06 NOTE — ED Notes (Addendum)
All of pt's belongings here at [facility] lockers were given to pt's mother, Eunice Blase, per pt's request.

## 2018-11-06 NOTE — BHH Counselor (Signed)
Per [facility], pt has been accepted to [facility]. TTS day shift to follow up.    Redmond Pulling, MS, [facility], [facility] Triage Specialist [phone]

## 2018-11-06 NOTE — BHH Counselor (Signed)
At 0253, clinician spoke to [facility] with [facility] TTS reviewed the pt's case and to see if they have availability to admit the pt. Deanna Artis reported, she will check the unit for availability and call clinician back, contact information was provided.    Redmond Pulling, MS, [facility], [facility] Triage Specialist [phone]

## 2018-11-06 NOTE — ED Provider Notes (Addendum)
[facility] EMERGENCY DEPARTMENT Provider Note   CSN: [id number] Arrival date & time: 11/05/18  2243    History   Chief Complaint Chief Complaint  Patient presents with  . V70.1    HPI Renee English is a 27 y.o. female.     Patient states that she wanted to kill herself.  She cut her left arm.  The history is provided by the patient. No language interpreter was used.  Altered Mental Status  Presenting symptoms: behavior changes   Severity:  Severe Most recent episode:  Today Episode history:  Single Timing:  Constant Progression:  Waxing and waning Chronicity:  New Context: alcohol use   Associated symptoms: no abdominal pain, no hallucinations, no headaches, no rash and no seizures     Past Medical History:  Diagnosis Date  . BV (bacterial vaginosis) 01/10/2013  . Chlamydia   . Contraceptive management 10/18/2013  . Postcoital bleeding 01/10/2013  . Renal stones 01/05/2010  . UTI (urinary tract infection)   . Vaginal irritation 10/18/2013    Patient Active Problem List   Diagnosis Date Noted  . Contraceptive management 10/18/2013  . Vaginal irritation 10/18/2013  . BV (bacterial vaginosis) 01/10/2013  . Postcoital bleeding 01/10/2013    History reviewed. No pertinent surgical history.   OB History   No obstetric history on file.      Home Medications    Prior to Admission medications   Medication Sig Start Date End Date Taking? Authorizing Provider  Acetaminophen (TYLENOL PO) Take 1 capsule by mouth daily as needed (pain).    [provider]  cephALEXin (KEFLEX) 500 MG capsule Take 1 capsule (500 mg total) by mouth 4 (four) times daily. 10/14/18   Triplett, Tammy, PA-C  HYDROcodone-acetaminophen (NORCO/VICODIN) 5-325 MG tablet Take one tab po q 4-6 hrs prn pain Patient not taking: Reported on 10/14/2018 03/01/17   Triplett, Tammy, PA-C  ibuprofen (ADVIL,MOTRIN) 600 MG tablet Take 1 tablet (600 mg total) by mouth every 6 (six) hours as needed.  03/01/17   Triplett, Tammy, PA-C  magic mouthwash w/lidocaine SOLN Take 10 mLs by mouth 4 (four) times daily as needed (throat pain). Note to pharmacy - equal parts diphendydramine, aluminum hydroxide and lidocaine HCL Patient not taking: Reported on 03/01/2017 11/16/16   Burgess Amor, PA-C  medroxyPROGESTERone (DEPO-PROVERA) 150 MG/ML injection Inject 150 mg into the muscle every 3 (three) months.    [provider]  Phenazopyridine HCl (AZO TABS PO) Take 1 tablet by mouth daily as needed.    [provider]  traMADol (ULTRAM) 50 MG tablet Take 1 tablet (50 mg total) by mouth every 6 (six) hours as needed. 10/14/18   Pauline Aus, PA-C    Family History Family History  Problem Relation Age of Onset  . Diabetes Maternal Grandmother   . Hypertension Maternal Grandmother   . Cancer Maternal Grandmother        breast  . Hypertension Maternal Grandfather   . Diabetes Maternal Grandfather   . Cancer Maternal Aunt        breast    Social History Social History   Tobacco Use  . Smoking status: Current Every Day Smoker    Packs/day: 0.25    Types: Cigarettes  . Smokeless tobacco: Never Used  Substance Use Topics  . Alcohol use: Yes    Comment: normally does not drink that much, but has drank the past 3 days   . Drug use: No    Comment: pt former cocaine user  Allergies   Sulfa antibiotics; Sesame oil; and Shellfish allergy   Review of Systems Review of Systems  Constitutional: Negative for appetite change and fatigue.  HENT: Negative for congestion, ear discharge and sinus pressure.   Eyes: Negative for discharge.  Respiratory: Negative for cough.   Cardiovascular: Negative for chest pain.  Gastrointestinal: Negative for abdominal pain and diarrhea.  Genitourinary: Negative for frequency and hematuria.  Musculoskeletal: Negative for back pain.  Skin: Negative for rash.  Neurological: Negative for seizures and headaches.  Psychiatric/Behavioral:  Positive for dysphoric mood. Negative for hallucinations.     Physical Exam Updated Vital Signs BP (!) 148/109 (BP Location: Right Arm)   Pulse (!) 129   Temp 98.4 F (36.9 C) (Oral)   Resp 18   Ht  (1.575 m)   Wt 68 kg   SpO2 100%   BMI 27.44 kg/m   Physical Exam Vitals signs and nursing note reviewed.  Constitutional:      Appearance: She is well-developed.  HENT:     Head: Normocephalic.     Nose: Nose normal.  Eyes:     General: No scleral icterus.    Conjunctiva/sclera: Conjunctivae normal.  Neck:     Musculoskeletal: Neck supple.     Thyroid: No thyromegaly.  Cardiovascular:     Rate and Rhythm: Normal rate and regular rhythm.     Heart sounds: No murmur. No friction rub. No gallop.   Pulmonary:     Breath sounds: No stridor. No wheezing or rales.  Chest:     Chest wall: No tenderness.  Abdominal:     General: There is no distension.     Tenderness: There is no abdominal tenderness. There is no rebound.  Musculoskeletal: Normal range of motion.  Lymphadenopathy:     Cervical: No cervical adenopathy.  Skin:    Findings: No erythema or rash.     Comments: 3 cm superficial laceration to left forearm  Neurological:     Mental Status: She is oriented to person, place, and time.     Motor: No abnormal muscle tone.     Coordination: Coordination normal.  Psychiatric:     Comments: Depressed and suicidal      ED Treatments / Results  Labs (all labs ordered are listed, but only abnormal results are displayed) Labs Reviewed  CBC WITH DIFFERENTIAL/PLATELET - Abnormal; Notable for the following components:      Result Value   Hemoglobin 15.1 (*)    All other components within normal limits  COMPREHENSIVE METABOLIC PANEL - Abnormal; Notable for the following components:   CO2 20 (*)    Glucose, Bld 102 (*)    Total Protein 8.2 (*)    Total Bilirubin 0.2 (*)    All other components within normal limits  ETHANOL - Abnormal; Notable for the following  components:   Alcohol, Ethyl (B) 234 (*)    All other components within normal limits  RAPID URINE DRUG SCREEN, HOSP PERFORMED  I-STAT BETA HCG BLOOD, ED ([facility], [facility], [facility] ONLY)    EKG None  Radiology No results found.  Procedures .Marland KitchenLaceration Repair Date/Time: 11/06/2018 12:53 AM Performed by: Bethann Berkshire, MD Authorized by: Bethann Berkshire, MD   Consent:    Consent obtained:  Verbal   Consent given by:  Patient   Risks discussed:  Infection   Alternatives discussed:  No treatment Anesthesia (see MAR for exact dosages):    Anesthesia method:  Local infiltration   Local anesthetic:  Lidocaine 1%  WITH epi Laceration details:    Location:  Shoulder/arm   Shoulder/arm location:  L upper arm   Length (cm):  3   Depth (mm):  1 Repair type:    Repair type:  Simple Pre-procedure details:    Preparation:  Patient was prepped and draped in usual sterile fashion Exploration:    Wound exploration: wound explored through full range of motion     Contaminated: no   Treatment:    Area cleansed with:  Betadine   Amount of cleaning:  Standard   Irrigation solution:  Sterile saline   Visualized foreign bodies/material removed: no   Skin repair:    Repair method:  Sutures   Suture size:  4-0   Suture material:  Nylon   Suture technique:  Simple interrupted   Number of sutures:  3 Post-procedure details:    Patient tolerance of procedure:  Tolerated well, no immediate complications Comments:     Patient had a 3 cm laceration to her ventral left forearm.  Area was cleaned thoroughly with Betadine and sterile fluids.  Lidocaine with epi was used to anesthetize the area and three 4-0 nylon sutures were used to close.   (including critical care time)  Medications Ordered in ED Medications  lidocaine-EPINEPHrine (XYLOCAINE-EPINEPHrine) 1 %-1:200000 (PF) injection 10 mL (has no administration in time range)     Initial Impression / Assessment and Plan / ED Course  I have reviewed the  triage vital signs and the nursing notes.  Pertinent labs & imaging results that were available during my care of the patient were reviewed by me and considered in my medical decision making (see chart for details).        Suicidal ideations and laceration to forearm Patient was seen by behavioral health and it was decided the patient needed inpatient treatment for her depression.  She will be admitted to behavioral health.  She needs to get her sutures removed in 1 week Final Clinical Impressions(s) / ED Diagnoses   Final diagnoses:  None    ED Discharge Orders    None       Bethann BerkshireZammit, Chandan Fly, MD 11/06/18 [id number]    Bethann BerkshireZammit, Juddson Cobern, MD 11/06/18 [phone]

## 2018-11-07 DIAGNOSIS — F322 Major depressive disorder, single episode, severe without psychotic features: Principal | ICD-10-CM

## 2018-11-07 DIAGNOSIS — F1010 Alcohol abuse, uncomplicated: Secondary | ICD-10-CM | POA: Diagnosis present

## 2018-11-07 NOTE — Progress Notes (Signed)
Recreation Therapy Notes  Date: 11/07/2018  Time: 9:30 am   Location: Craft room   Behavioral response: N/A   Intervention Topic: Necessities  Discussion/Intervention: Patient did not attend group.   Clinical Observations/Feedback:  Patient did not attend group.   Stein Windhorst LRT/CTRS         Elizebath Wever 11/07/2018 11:08 AM

## 2018-11-07 NOTE — Tx Team (Signed)
Interdisciplinary Treatment and Diagnostic Plan Update  11/07/2018 Time of Session: 1030AM Renee English MRN: [id number]  Principal Diagnosis: MDD (major depressive disorder), single episode, severe , no psychosis (HCC)  Secondary Diagnoses: Principal Problem:   MDD (major depressive disorder), single episode, severe , no psychosis (HCC) Active Problems:   Suicide attempt (HCC)   Alcohol abuse   Current Medications:  Current Facility-Administered Medications  Medication Dose Route Frequency Provider Last Rate Last Dose  . acetaminophen (TYLENOL) tablet 650 mg  650 mg Oral Q6H PRN Mariel Craft, MD      . alum & mag hydroxide-simeth (MAALOX/MYLANTA) 200-200-20 MG/5ML suspension 30 mL  30 mL Oral Q4H PRN Mariel Craft, MD      . hydrOXYzine (ATARAX/VISTARIL) tablet 25 mg  25 mg Oral TID PRN Mariel Craft, MD      . magnesium hydroxide (MILK OF MAGNESIA) suspension 30 mL  30 mL Oral Daily PRN Mariel Craft, MD      . nicotine (NICODERM CQ - dosed in mg/24 hours) patch 14 mg  14 mg Transdermal Daily Clapacs, Jackquline Denmark, MD       PTA Medications: No medications prior to admission.    Patient Stressors: Marital or family conflict Substance abuse  Patient Strengths: Ability for insight Active sense of humor Average or above average intelligence Capable of independent living Metallurgist fund of knowledge Motivation for treatment/growth Physical Health Religious Affiliation Special hobby/interest Supportive family/friends Work skills  Treatment Modalities: Medication Management, Group therapy, Case management,  1 to 1 session with clinician, Psychoeducation, Recreational therapy.   Physician Treatment Plan for Primary Diagnosis: MDD (major depressive disorder), single episode, severe , no psychosis (HCC) Long Term Goal(s):     Short Term Goals:    Medication Management: Evaluate patient's response, side effects, and tolerance of  medication regimen.  Therapeutic Interventions: 1 to 1 sessions, Unit Group sessions and Medication administration.  Evaluation of Outcomes: Progressing  Physician Treatment Plan for Secondary Diagnosis: Principal Problem:   MDD (major depressive disorder), single episode, severe , no psychosis (HCC) Active Problems:   Suicide attempt (HCC)   Alcohol abuse  Long Term Goal(s):     Short Term Goals:       Medication Management: Evaluate patient's response, side effects, and tolerance of medication regimen.  Therapeutic Interventions: 1 to 1 sessions, Unit Group sessions and Medication administration.  Evaluation of Outcomes: Progressing   RN Treatment Plan for Primary Diagnosis: MDD (major depressive disorder), single episode, severe , no psychosis (HCC) Long Term Goal(s): Knowledge of disease and therapeutic regimen to maintain health will improve  Short Term Goals: Ability to remain free from injury will improve, Ability to verbalize feelings will improve, Ability to disclose and discuss suicidal ideas and Ability to identify and develop effective coping behaviors will improve  Medication Management: RN will administer medications as ordered by provider, will assess and evaluate patient's response and provide education to patient for prescribed medication. RN will report any adverse and/or side effects to prescribing provider.  Therapeutic Interventions: 1 on 1 counseling sessions, Psychoeducation, Medication administration, Evaluate responses to treatment, Monitor vital signs and CBGs as ordered, Perform/monitor CIWA, COWS, AIMS and Fall Risk screenings as ordered, Perform wound care treatments as ordered.  Evaluation of Outcomes: Progressing   Renee Treatment Plan for Primary Diagnosis: MDD (major depressive disorder), single episode, severe , no psychosis (HCC) Long Term Goal(s): Safe transition to appropriate next level of care at discharge, Engage patient  in therapeutic group  addressing interpersonal concerns.  Short Term Goals: Engage patient in aftercare planning with referrals and resources, Increase emotional regulation, Facilitate acceptance of mental health diagnosis and concerns and Increase skills for wellness and recovery  Therapeutic Interventions: Assess for all discharge needs, 1 to 1 time with Social worker, Explore available resources and support systems, Assess for adequacy in community support network, Educate family and significant other(s) on suicide prevention, Complete Psychosocial Assessment, Interpersonal group therapy.  Evaluation of Outcomes: Progressing   Progress in Treatment: Attending groups: No. Participating in groups: No. Taking medication as prescribed: Yes. Toleration medication: Yes. Family/Significant other contact made: No, will contact:  pts fiance Renee English Patient understands diagnosis: Yes. Discussing patient identified problems/goals with staff: Yes. Medical problems stabilized or resolved: Yes. Denies suicidal/homicidal ideation: Yes. Issues/concerns per patient self-inventory: No. Other: N/A  New problem(s) identified: No, Describe:  none  New Short Term/Long Term Goal(s):medication management for mood stabilization; elimination of SI thoughts; development of comprehensive mental wellness/sobriety plan.   Patient Goals:  "to calm down and think about everything"  Discharge Plan or Barriers: SPE pamphlet, Mobile Crisis information, and AA/NA information provided to patient for additional community support and resources. Pt has an appointment scheduled with Renee English on 11/15/2018 at 10:30AM.   Reason for Continuation of Hospitalization: Medication stabilization  Estimated Length of Stay: Tomorrow 11/08/2018  Attendees: Patient: Renee English 11/07/2018 10:39 English  Physician: Nelly RoutArchana Kumar MD 11/07/2018 10:39 English  Nursing:  11/07/2018 10:39 AM  RN Care Manager: 11/07/2018 10:39 English  Social Worker: Renee Scalelivia Sandrika English  Renee English  Recreational Therapist: Garret ReddishShay English CTRS LRT 11/07/2018 10:39 English  Other: Renee HomansMichaela English Renee English  Other: Renee MeansJamison Lord NP 11/07/2018 10:39 English  Renee English    Scribe for Treatment Team: Charlann Langelivia K Gabreal Worton, Renee English

## 2018-11-07 NOTE — [facility] Suicide Risk Assessment (Signed)
[facility] INPATIENT:  Family/Significant Other Suicide Prevention Education  Suicide Prevention Education:  Patient Refusal for Family/Significant Other Suicide Prevention Education: The patient Renee English has refused to provide written consent for family/significant other to be provided Family/Significant Other Suicide Prevention Education during admission and/or prior to discharge.  Physician notified.  SPE completed with pt, as pt refused to consent to family contact. SPI pamphlet provided to pt and pt was encouraged to share information with support network, ask questions, and talk about any concerns relating to SPE. Pt denies access to guns/firearms and verbalized understanding of information provided. Mobile Crisis information also provided to pt.    Charlann Lange Andelyn Spade MSW LCSW 11/07/2018, 9:33 AM

## 2018-11-07 NOTE — Progress Notes (Signed)
D: Patient stated slept good last night .Stated appetite good and energy level   normal. Stated concentration is good . Stated on Depression scale 0 , hopeless 0 and anxiety 0.( low 0-10 high) Denies suicidal  homicidal ideations  .  No auditory hallucinations  No pain concerns . Appropriate ADL'S. Interacting with peers and staff. Patient aware of  information  given relating to [facility] education , patient  able to verbalize  understanding .  Marland Kitchen Voice of no safety concerns.  Compliant  with medication. Patient able to verbalize positive feeling of self .  Attending unit programing , working on coping and decision making. Denies suicidal  ideations  Laceration site  With stiches  Intact  No redness or drainage   A: Encourage patient participation with unit programming . Instruction  Given on  Medication , verbalize understanding. R: Voice no other concerns. Staff continue to monitor
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
## 2018-11-07 NOTE — [facility] Group Notes (Signed)
Feelings Around Diagnosis 11/07/2018 1PM  Type of Therapy/Topic:  Group Therapy:  Feelings about Diagnosis  Participation Level:  Did Not Attend   Description of Group:   This group will allow patients to explore their thoughts and feelings about diagnoses they have received. Patients will be guided to explore their level of understanding and acceptance of these diagnoses. Facilitator will encourage patients to process their thoughts and feelings about the reactions of others to their diagnosis and will guide patients in identifying ways to discuss their diagnosis with significant others in their lives. This group will be process-oriented, with patients participating in exploration of their own experiences, giving and receiving support, and processing challenge from other group members.   Therapeutic Goals: 1. Patient will demonstrate understanding of diagnosis as evidenced by identifying two or more symptoms of the disorder 2. Patient will be able to express two feelings regarding the diagnosis 3. Patient will demonstrate their ability to communicate their needs through discussion and/or role play  Summary of Patient Progress:       Therapeutic Modalities:   Cognitive Behavioral Therapy Brief Therapy Feelings Identification    Suzan Slick, LCSW 11/07/2018 2:18 PM
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
## 2018-11-07 NOTE — Plan of Care (Signed)
Patient aware of  information  given relating to [facility] education , patient  able to verbalize  understanding .  Marland Kitchen Voice of no safety concerns.  Compliant  with medication. Patient able to verbalize positive feeling of self .  Attending unit programing , working on coping and decision making. Denies suicidal  ideations    Problem: Education: Goal: Emotional status will improve Outcome: Progressing Goal: Mental status will improve Outcome: Progressing   Problem: Health Behavior/Discharge Planning: Goal: Compliance with treatment plan for underlying cause of condition will improve Outcome: Progressing   Problem: Safety: Goal: Periods of time without injury will increase Outcome: Progressing   Problem: Coping: Goal: Coping ability will improve Outcome: Progressing   Problem: Safety: Goal: Ability to disclose and discuss suicidal ideas will improve Outcome: Progressing Goal: Ability to identify and utilize support systems that promote safety will improve Outcome: Progressing
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
## 2018-11-08 ENCOUNTER — Other Ambulatory Visit: Payer: Self-pay

## 2018-11-08 NOTE — Plan of Care (Signed)
  Problem: Coping: Goal: Coping ability will improve Outcome: Progressing  Patient appears interacting appropriately with peers and staff, denies suicidal ideation.

## 2018-11-08 NOTE — Progress Notes (Signed)
Patient alert and oriented x 4, affect is flat but brightens upon approach, thoughts are organized and coherent no bizarre behavior noted, interacting appropriately with peers and staff, patient rated depression a 5/10,  She denies SI/HI/AVH she appears less anxious and has insight into her treatment plan. Patient expressed some remorse/ regret to Clinical research associate and wished she didn't harm herself. Patient's wrist is intact with sutures no redness or drainage noted. 15 miutes safety checks maintained will continue to monitor.

## 2018-11-08 NOTE — Progress Notes (Signed)
Recreation Therapy Notes   Date:11/08/2018  Time:9:30 am  Location:Craft room  Behavioral response:N/A  Intervention Topic: Problem solving  Discussion/Intervention: Patient did not attend group.  Clinical Observations/Feedback:  Patient did not attend group.  Nica Friske LRT/CTRS         Myrtice Lowdermilk 11/08/2018 10:30 AM

## 2018-11-08 NOTE — Progress Notes (Signed)
D: Patient is aware of  Discharge this shift .Patient denies suicidal /homicidal ideations. Patient received all belongings brought in  A: No Storage medications. Writer reviewed Discharge Summary, Suicide Risk Assessment, and Transitional Record.  Aware  Of follow up appointment . R: Patient left unit with no questions  Or concerns  With  Friend

## 2018-11-08 NOTE — Discharge Summary (Signed)
Physician Discharge Summary Note  Patient:  Renee English is an 28 y.o., female MRN:  [id number] DOB:  Oct 07, 1990 Patient phone:  [phone] (home)  Patient address:   [address] [place] [address],  Total Time spent with patient: 30 minutes  Date of Admission:  11/06/2018 Date of Discharge: 11/08/18  Reason for Admission:  Suicide attempt while intoxicated  Principal Problem: MDD (major depressive disorder), single episode, severe , no psychosis ([place]) Discharge Diagnoses: Principal Problem:   MDD (major depressive disorder), single episode, severe , no psychosis ([place]) Active Problems:   Alcohol abuse   Suicide attempt ([place])  Past Psychiatric History: none  Past Medical History:  Past Medical History:  Diagnosis Date  . BV (bacterial vaginosis) 01/10/2013  . Chlamydia   . Contraceptive management 10/18/2013  . Postcoital bleeding 01/10/2013  . Renal stones 01/05/2010  . UTI (urinary tract infection)   . Vaginal irritation 10/18/2013   History reviewed. No pertinent surgical history. Family History:  Family History  Problem Relation Age of Onset  . Diabetes Maternal Grandmother   . Hypertension Maternal Grandmother   . Cancer Maternal Grandmother        breast  . Hypertension Maternal Grandfather   . Diabetes Maternal Grandfather   . Cancer Maternal Aunt        breast   Family Psychiatric  History: none Social History:  Social History   Substance and Sexual Activity  Alcohol Use Yes   Comment: normally does not drink that much, but has drank the past 3 days      Social History   Substance and Sexual Activity  Drug Use No   Comment: pt former cocaine user     Social History   Socioeconomic History  . Marital status: Single    Spouse name: Not on file  . Number of children: Not on file  . Years of education: Not on file  . Highest education level: Not on file  Occupational History  . Not on file  Social Needs  . Financial resource strain: Not on file   . Food insecurity:    Worry: Not on file    Inability: Not on file  . Transportation needs:    Medical: Not on file    Non-medical: Not on file  Tobacco Use  . Smoking status: Current Every Day Smoker    Packs/day: 0.25    Types: Cigarettes  . Smokeless tobacco: Never Used  Substance and Sexual Activity  . Alcohol use: Yes    Comment: normally does not drink that much, but has drank the past 3 days   . Drug use: No    Comment: pt former cocaine user   . Sexual activity: Not Currently    Birth control/protection: Inserts  Lifestyle  . Physical activity:    Days per week: Not on file    Minutes per session: Not on file  . Stress: Not on file  Relationships  . Social connections:    Talks on phone: Not on file    Gets together: Not on file    Attends religious service: Not on file    Active member of club or organization: Not on file    Attends meetings of clubs or organizations: Not on file    Relationship status: Not on file  Other Topics Concern  . Not on file  Social History Narrative  . Not on file    Hospital Course:  ON admission to the ED at [facility],  she was intoxicated and reported cutting herself in an attempt to hurt herself.  She was accepted and transferred here.  On admission to the unit on 11/07/18: Bright affect and engaged appropriately.  Reports "I drank way too much wine."  Denies this is typical for her.  She was stressed with staying home from work (starts back on Saturday) and dealing with her significant others ex-wife.  He is supportive and has discussed things with his ex, will try not to involve her in the future.  Denies suicidal/homicidal ideations at this time and no past history or attempts.  She does admit she was in the moment suicidal but realizes it "was very stupid" and regrets her actions.  She will work on Radiographer, therapeutic in therapy here and would like to discharge tomorrow which is contentious on talking to her significant other that she gave  permission, written, to obtain.  No medications started except hydroxyzine 25 mg TID PRN anxiety  11/08/18:  Patient has met maximum benefit of hospitalization and denies suicidal/homicidal ideations, hallucinations, and withdrawal symptoms.  Her boyfriend is supportive with no safety concerns reports she only becomes like this if she drinks wine or liquor, she plans to refrain from her occasional use to prevent negative side effects of her impulsivity.  Discussed other things she can do when she gets upset also.  Musculoskeletal: Strength & Muscle Tone: within normal limits Gait & Station: normal Patient leans: N/A  Psychiatric Specialty Exam: Physical Exam  Nursing note and vitals reviewed. Constitutional: She is oriented to person, place, and time. She appears well-developed and well-nourished.  HENT:  Head: Normocephalic.  Neck: Normal range of motion.  Respiratory: Effort normal.  Musculoskeletal: Normal range of motion.  Neurological: She is alert and oriented to person, place, and time.  Psychiatric: Her speech is normal and behavior is normal. Judgment and thought content normal. Her mood appears anxious. Cognition and memory are normal. She exhibits a depressed mood.    Review of Systems  Psychiatric/Behavioral: Positive for depression and substance abuse. The patient is nervous/anxious.   All other systems reviewed and are negative.   Blood pressure 126/86, pulse (!) 101, temperature 98.7 F (37.1 C), temperature source Oral, resp. rate 16, height 5' 1" (1.549 m), weight 70.3 kg, SpO2 99 %.Body mass index is 29.29 kg/m.  General Appearance: Casual  Eye Contact:  Good  Speech:  Normal Rate  Volume:  Normal  Mood:  Anxious and Depressed, mild  Affect:  Euthymic  Thought Process:  Coherent and Descriptions of Associations: Intact  Orientation:  Full (Time, Place, and Person)  Thought Content:  WDL and Logical  Suicidal Thoughts:  No  Homicidal Thoughts:  No  Memory:   Immediate;   Good Recent;   Good Remote;   Good  Judgement:  Fair  Insight:  Good  Psychomotor Activity:  Normal  Concentration:  Concentration: Good and Attention Span: Good  Recall:  Good  Fund of Knowledge:  Good  Language:  Good  Akathisia:  No  Handed:  Right  AIMS (if indicated):     Assets:  Communication Skills Desire for Improvement Housing Intimacy Leisure Time Physical Health Resilience Social Support Talents/Skills Transportation Vocational/Educational  ADL's:  Intact  Cognition:  WNL  Sleep:  Number of Hours: 7     Have you used any form of tobacco in the last 30 days? (Cigarettes, Smokeless Tobacco, Cigars, and/or Pipes): Yes  Has this patient used any form of tobacco in the last 30  days? (Cigarettes, Smokeless Tobacco, Cigars, and/or Pipes) Yes, Yes, A prescription for an FDA-approved tobacco cessation medication was offered at discharge and the patient refused  Blood Alcohol level:  Lab Results  Component Value Date   ETH 234 (H) 78/29/5621    Metabolic Disorder Labs:  No results found for: HGBA1C, MPG No results found for: PROLACTIN No results found for: CHOL, TRIG, HDL, CHOLHDL, VLDL, LDLCALC  See Psychiatric Specialty Exam and Suicide Risk Assessment completed by Attending Physician prior to discharge.  Discharge destination:  Home  Is patient on multiple antipsychotic therapies at discharge:  No   Has Patient had three or more failed trials of antipsychotic monotherapy by history:  No  Recommended Plan for Multiple Antipsychotic Therapies: NA  Discharge Instructions    Diet - low sodium heart healthy   Complete by:  As directed    Discharge instructions   Complete by:  As directed    Follow up with outpatient appointment/provider   Increase activity slowly   Complete by:  As directed      Allergies as of 11/08/2018      Reactions   Sulfa Antibiotics Anaphylaxis, Hives, Swelling   Sesame Oil Hives, Swelling   Shellfish Allergy  Hives, Swelling      Medication List    You have not been prescribed any medications.    Follow-up Information    [facility] Follow up on 11/15/2018.   Why:  [facility] will be calling you on Thursday, May 7th to complete your paperwork by phone.  Your telephonic appointment with Janett Billow (therapist) is scheduled for Wednesday, May 13th at 10:30am.  Thank you. Contact information: [address] [place] [address] [phone]           Follow-up recommendations:  Activity:  as tolerated Diet:  heart healthy diet  Comments:  Follow up with appointment above  Signed: Waylan Boga, NP 11/08/2018, 8:35 AM

## 2018-11-08 NOTE — Progress Notes (Signed)
  [facility] Adult Case Management Discharge Plan :  Will you be returning to the same living situation after discharge:  Yes,  home At discharge, do you have transportation home?: Yes,  fiance Do you have the ability to pay for your medications: Yes,  mental health  Release of information consent forms completed and in the chart;    Patient to Follow up at: Follow-up Information    [facility] Follow up on 11/15/2018.   Why:  [facility] [place] will be calling you on Thursday, May 7th to complete your paperwork by phone.  Your telephonic appointment with Shanda Bumps (therapist) is scheduled for Wednesday, May 13th at 10:30am.  Thank you. Contact information: [address] [place] [address] [phone]           Next level of care provider has access to [facility] Link:no  Safety Planning and Suicide Prevention discussed: Yes,  SPE completed with pts fiance  Have you used any form of tobacco in the last 30 days? (Cigarettes, Smokeless Tobacco, Cigars, and/or Pipes): Yes  Has patient been referred to the Quitline?: Patient refused referral  Patient has been referred for addiction treatment: N/A  Mechele Dawley, LCSW 11/08/2018, 9:45 AM

## 2018-11-08 NOTE — [facility] Suicide Risk Assessment (Signed)
[facility]   Principal Problem: MDD (major depressive disorder), single episode, severe , no psychosis ([facility]) Discharge Diagnoses: Principal Problem:   MDD (major depressive disorder), single episode, severe , no psychosis ([facility]) Active Problems:   Suicide attempt ([facility])   Alcohol abuse  Patient is a [age] year old admitted from the ED for stabilization treatment of agitation, suicidal ideation along with a suicide attempt by trying to cut herself with a knife.  Patient needed 3 stitches.  This morning, patient reports that she is doing fairly well, has worked on her coping skills, her frustration tolerance, her alcohol use and her relationship with her boyfriend/fianc.  On a scale of 0-10 with 0 being no symptoms and 10 being the worst, patient reports that her depression is a 1 out of 10.  She denies any aggravating factors and reports that her relationship with her fianc is a relieving factor.  She also has that she is okay with seeing an outpatient therapist.  Patient denies any thoughts of self-harm, harm to others, any psychotic symptoms  Total Time spent with patient: 30 minutes  Musculoskeletal: Strength & Muscle Tone: within normal limits Gait & Station: normal Patient leans: N/A  Psychiatric Specialty Exam: Review of Systems  Constitutional: Negative.  Negative for chills, diaphoresis, fever, malaise/fatigue and weight loss.  HENT: Negative.  Negative for congestion and sore throat.   Eyes: Negative.  Negative for blurred vision, double vision, discharge and redness.  Respiratory: Negative.  Negative for cough, shortness of breath and wheezing.   Cardiovascular: Negative.  Negative for chest pain and palpitations.  Gastrointestinal: Negative.  Negative for abdominal pain, constipation, diarrhea, heartburn, nausea and vomiting.  Musculoskeletal: Negative.  Negative for falls and myalgias.  Skin: Negative.  Negative for itching.  Neurological: Negative.   Negative for dizziness, seizures, loss of consciousness and headaches.  Endo/Heme/Allergies: Negative.  Negative for environmental allergies.  Psychiatric/Behavioral: Negative.  Negative for depression, hallucinations, memory loss, substance abuse and suicidal ideas. The patient is not nervous/anxious and does not have insomnia.     Blood pressure 126/86, pulse (!) 101, temperature 98.7 F (37.1 C), temperature source Oral, resp. rate 16, height 5\' 1"  (1.549 m), weight 70.3 kg, SpO2 99 %.Body mass index is 29.29 kg/m.  General Appearance: Casual  Eye Contact::  Fair  Speech:  Clear and Coherent and Normal Rate409  Volume:  Normal  Mood:  Euthymic  Affect:  Congruent and Full Range  Thought Process:  Coherent, Goal Directed and Descriptions of Associations: Intact  Orientation:  Full (Time, Place, and Person)  Thought Content:  WDL and Logical  Suicidal Thoughts:  No  Homicidal Thoughts:  No  Memory:  Immediate;   Fair Recent;   Fair Remote;   Fair  Judgement:  Intact  Insight:  Good  Psychomotor Activity:  Normal  Concentration:  Fair  Recall:  [employer]  Fund of Knowledge:Fair  Language: Fair  Akathisia:  No  Handed:  Right  AIMS (if indicated):     Assets:  Desire for Improvement Financial Resources/Insurance Housing Physical Health Social Support Talents/Skills Transportation  Sleep:  Number of Hours: 7  Cognition: WNL  ADL's:  Intact   Mental Status Per Nursing Assessment::   On Admission:  NA(Denies)  Demographic Factors:  Caucasian  Loss Factors: NA  Historical Factors: NA  Risk Reduction Factors:   Living with another person, especially a relative, Positive social support, Positive therapeutic relationship and Positive coping skills or problem solving skills  Continued Clinical Symptoms:  Depression:   Impulsivity  Cognitive Features That Contribute To Risk:  None    Suicide Risk:  Minimal: No identifiable suicidal ideation.  Patients presenting with  no risk factors but with morbid ruminations; may be classified as minimal risk based on the severity of the depressive symptoms  Follow-up Information    [facility] Follow up on 11/15/2018.   Why:  [facility] will be calling you on Thursday, May 7th to complete your paperwork by phone.  Your telephonic appointment with Shanda Bumps (therapist) is scheduled for Wednesday, May 13th at 10:30am.  Thank you. Contact information: [address] [place] [address] [phone]           Plan Of Care/Follow-up recommendations:  Activity:  as tolerated Diet:  regular Other:  keep follow up appointments  Nelly Rout, MD 11/08/2018, 9:56 AM

## 2019-03-21 ENCOUNTER — Other Ambulatory Visit: Payer: Self-pay

## 2019-03-21 DIAGNOSIS — Z20822 Contact with and (suspected) exposure to covid-19: Secondary | ICD-10-CM

## 2019-03-22 LAB — NOVEL CORONAVIRUS, NAA: SARS-CoV-2, NAA: NOT DETECTED

## 2019-03-23 ENCOUNTER — Telehealth: Payer: Self-pay | Admitting: General Practice

## 2019-03-23 NOTE — Telephone Encounter (Signed)
Negative COVID results given. Patient results "NOT Detected." Caller expressed understanding. ° °

## 2019-07-10 DIAGNOSIS — J069 Acute upper respiratory infection, unspecified: Secondary | ICD-10-CM | POA: Diagnosis not present

## 2019-11-20 IMAGING — CT CT ABDOMEN AND PELVIS WITH CONTRAST
2 of 4 series · 16 of 46 positions shown, 18 images · IV contrast (Isovue)
Comparison: CT scan of May 22, 2015.

CLINICAL DATA: Acute right lower quadrant abdominal pain.

EXAM:
CT ABDOMEN AND PELVIS WITH CONTRAST
TECHNIQUE: Multidetector CT imaging of the abdomen and pelvis was performed
using the standard protocol following bolus administration of
intravenous contrast.
CONTRAST:  100mL OMNIPAQUE IOHEXOL 300 MG/ML  SOLN

[Series 2: axial st · axial · 0.65mm/px · z∈[+780,+1195]mm · 13 of 91 slices shown, 15 images]
[im 4/91  soft-tissue]
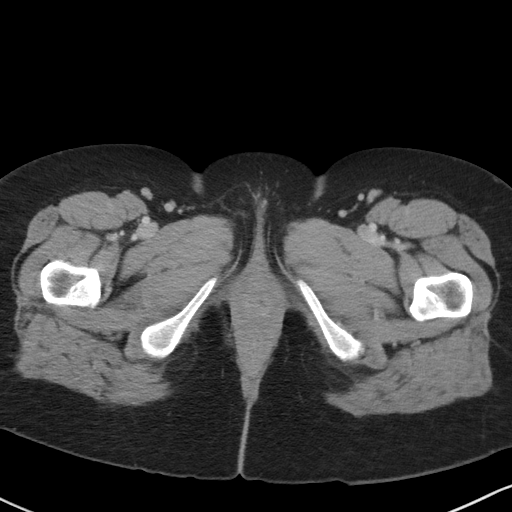
[im 4/91  bone]
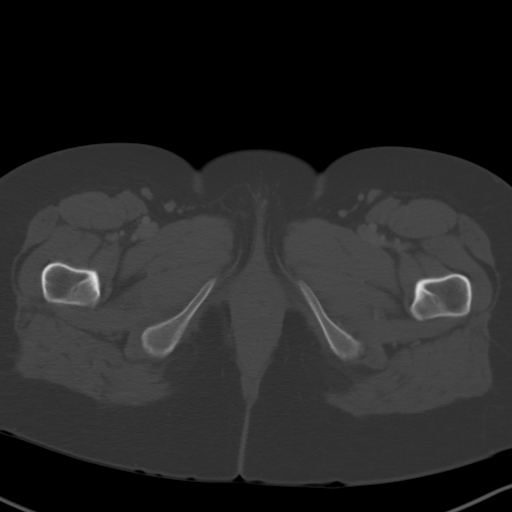
[im 11/91  soft-tissue]
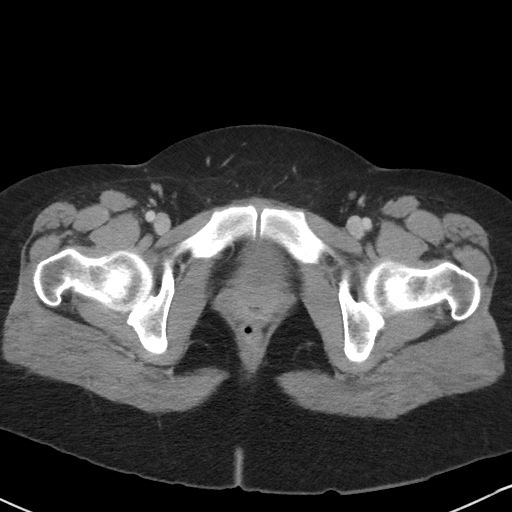
[im 19/91  soft-tissue]
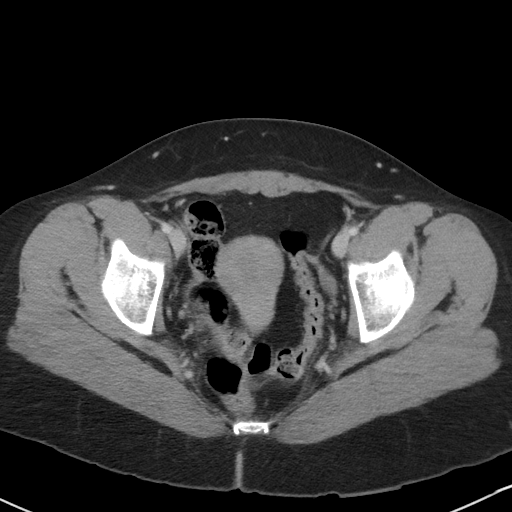
[im 26/91  soft-tissue]
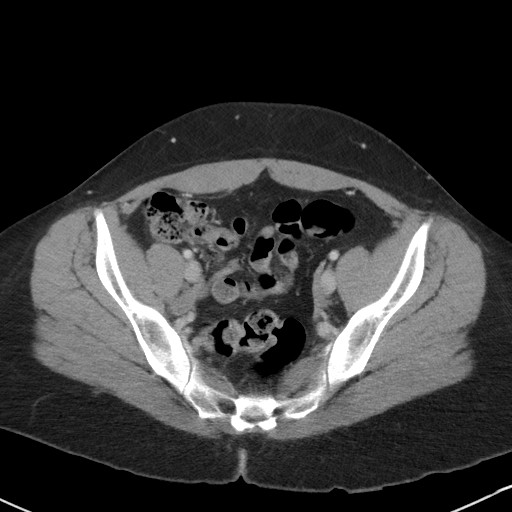
[im 33/91  soft-tissue]
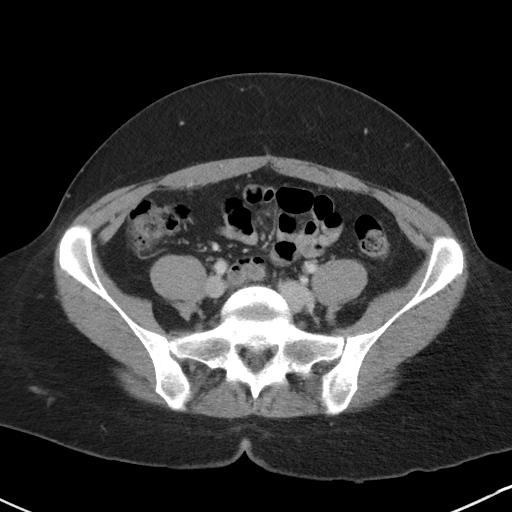
[im 40/91  soft-tissue]
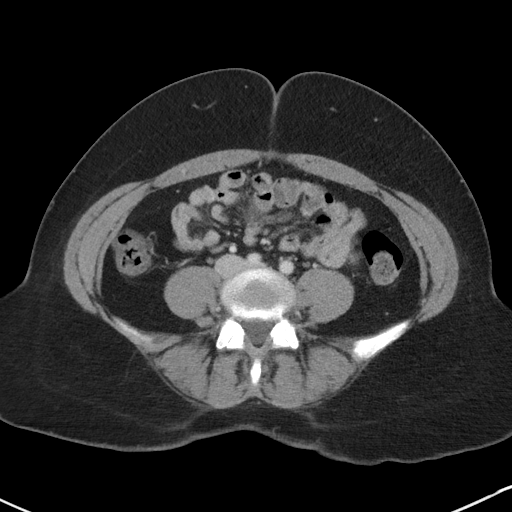
[im 47/91  soft-tissue]
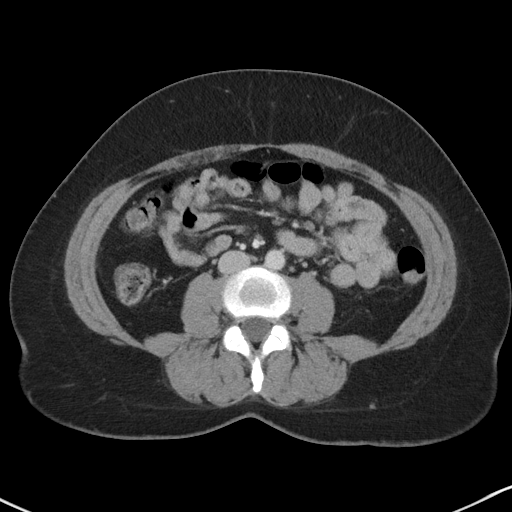
[im 51/91  soft-tissue]
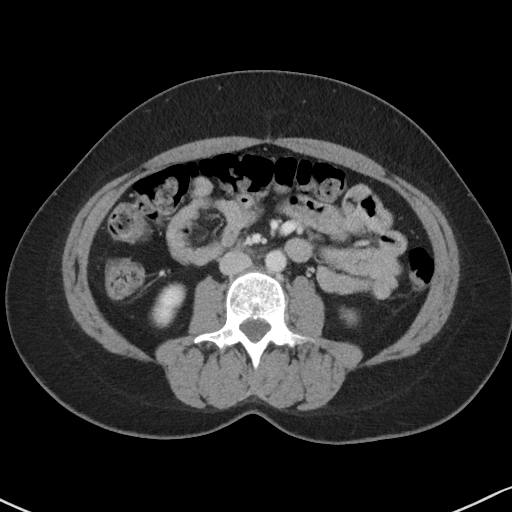
[im 58/91  soft-tissue]
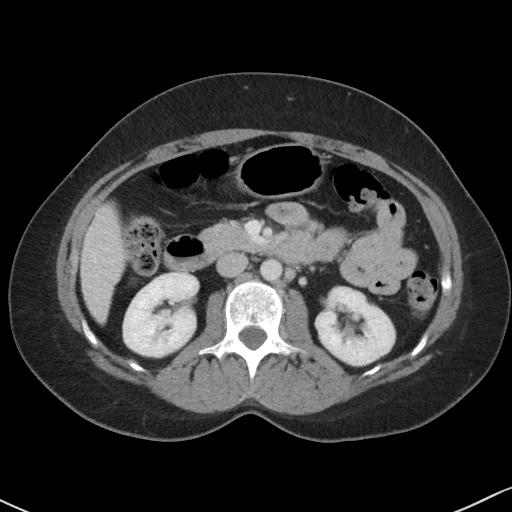
[im 58/91  bone]
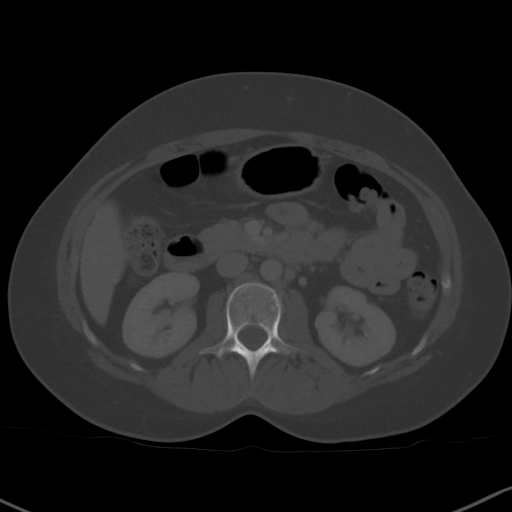
[im 65/91  soft-tissue]
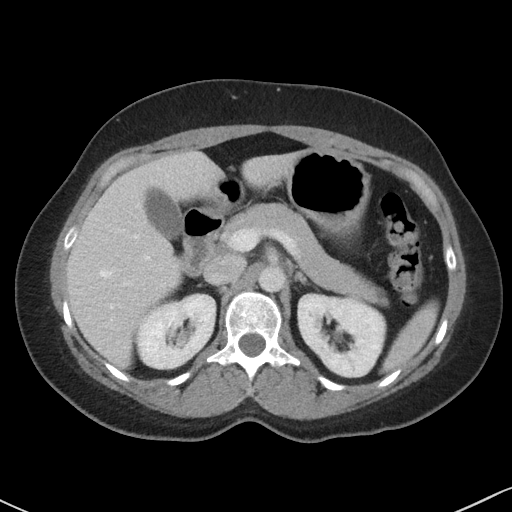
[im 73/91  soft-tissue]
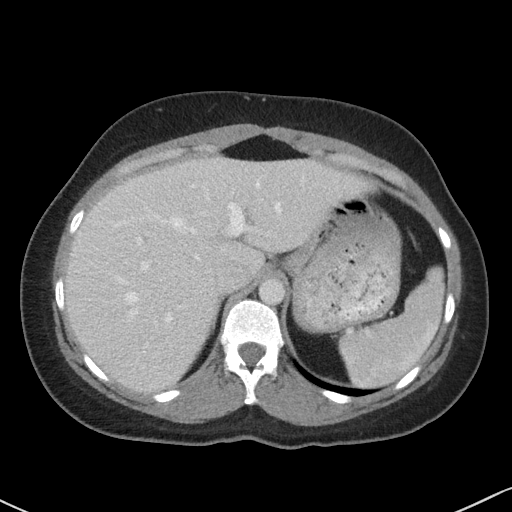
[im 80/91  soft-tissue]
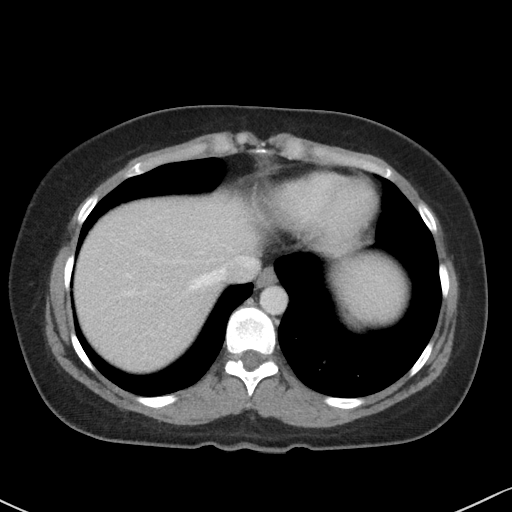
[im 87/91  soft-tissue]
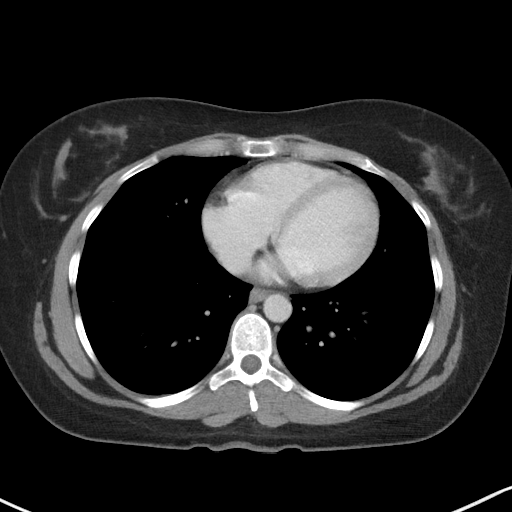

[Series 5: coronal st · coronal · 0.71mm/px · 3 of 104 slices shown]
[im 35/104  soft-tissue]
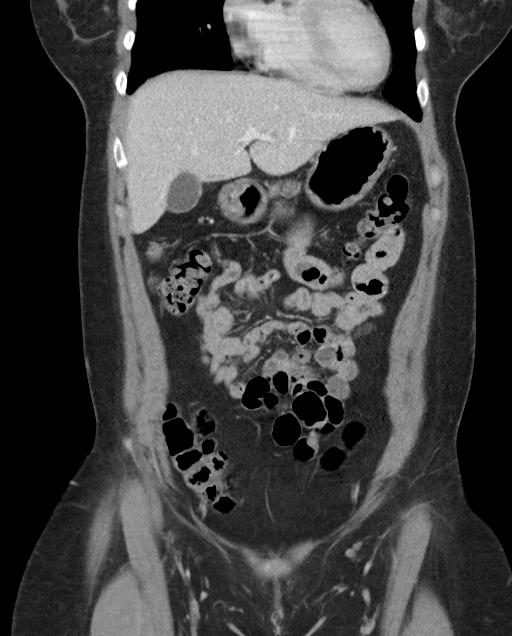
[im 46/104  soft-tissue]
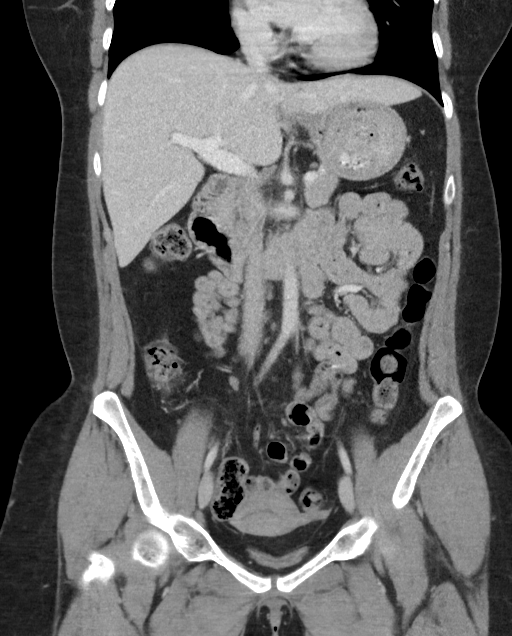
[im 58/104  soft-tissue]
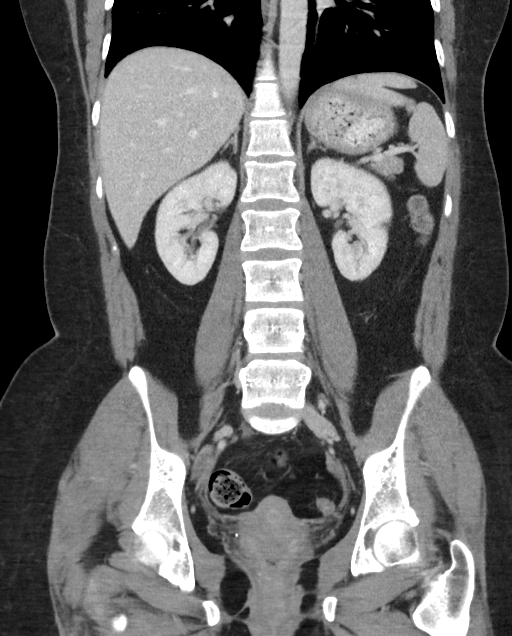

[16 of 46 positions shown; findings below may reference images not displayed]

FINDINGS: Lower chest: No acute abnormality.

Hepatobiliary: No focal liver abnormality is seen. No gallstones,
gallbladder wall thickening, or biliary dilatation.

Pancreas: Unremarkable. No pancreatic ductal dilatation or
surrounding inflammatory changes.

Spleen: Normal in size without focal abnormality.

Adrenals/Urinary Tract: Adrenal glands are unremarkable. Kidneys are
normal, without renal calculi, focal lesion, or hydronephrosis.
Bladder is unremarkable.

Stomach/Bowel: Stomach is within normal limits. Appendix appears
normal. No evidence of bowel wall thickening, distention, or
inflammatory changes.

Vascular/Lymphatic: No significant vascular findings are present. No
enlarged abdominal or pelvic lymph nodes.

Reproductive: Uterus and bilateral adnexa are unremarkable.

Other: No abdominal wall hernia or abnormality. No abdominopelvic
ascites.

Musculoskeletal: No acute or significant osseous findings.
IMPRESSION: No abnormality seen in the abdomen or pelvis.

## 2020-01-03 IMAGING — US ARTERIAL AND VENOUS ULTRASOUND OF THE ABDOMEN PELVIS AND SCROTUM
2 series · 13 of 25 positions shown · non-contrast
Comparison: None.

CLINICAL DATA: RIGHT-sided pain with urination for 2 days.

EXAM:
TRANSABDOMINAL AND TRANSVAGINAL ULTRASOUND OF PELVIS
DOPPLER ULTRASOUND OF OVARIES
TECHNIQUE: Both transabdominal and transvaginal ultrasound examinations of the
pelvis were performed. Transabdominal technique was performed for
global imaging of the pelvis including uterus, ovaries, adnexal
regions, and pelvic cul-de-sac.
It was necessary to proceed with endovaginal exam following the
transabdominal exam to visualize the endometrial complex and adnexal
structures. Color and duplex Doppler ultrasound was utilized to
evaluate blood flow to the ovaries.

[Series 1: arterial and venous ultrasound of the abdomen pelv · 0.24mm/px · 12 of 123 slices shown (1 of 2)]
[im 1/123]
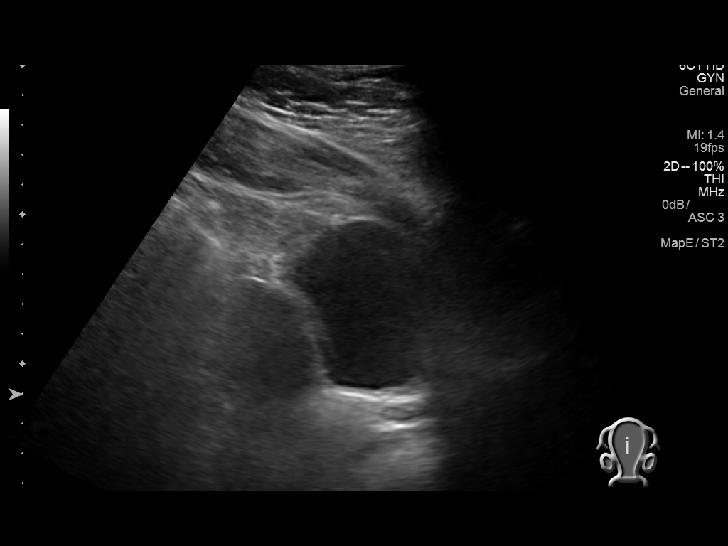
[im 11/123]
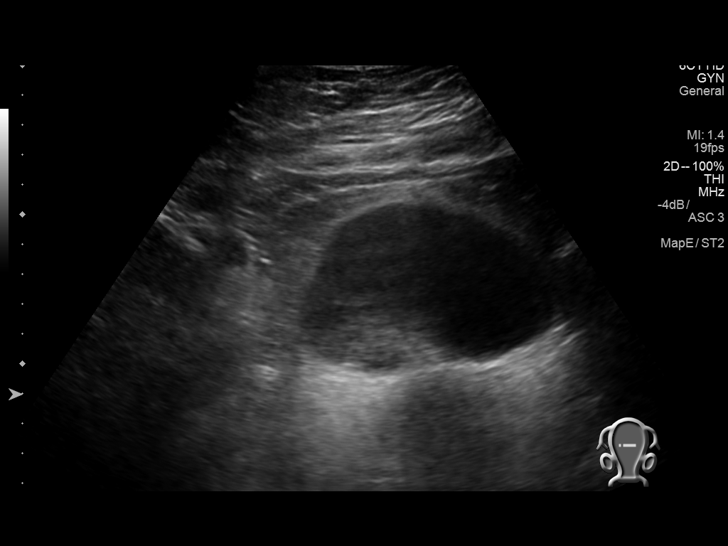
[im 22/123]
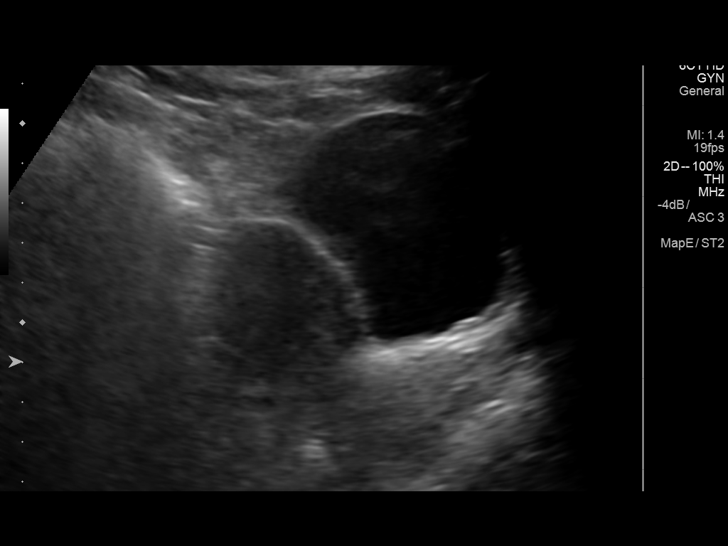
[im 32/123]
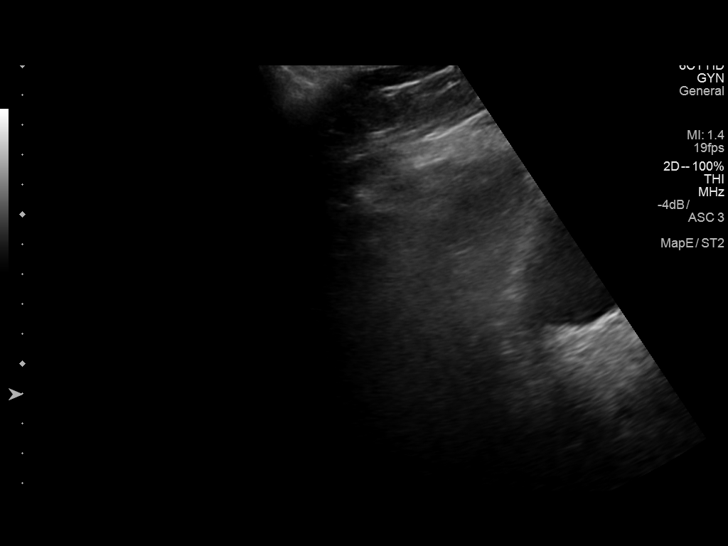
[im 43/123]
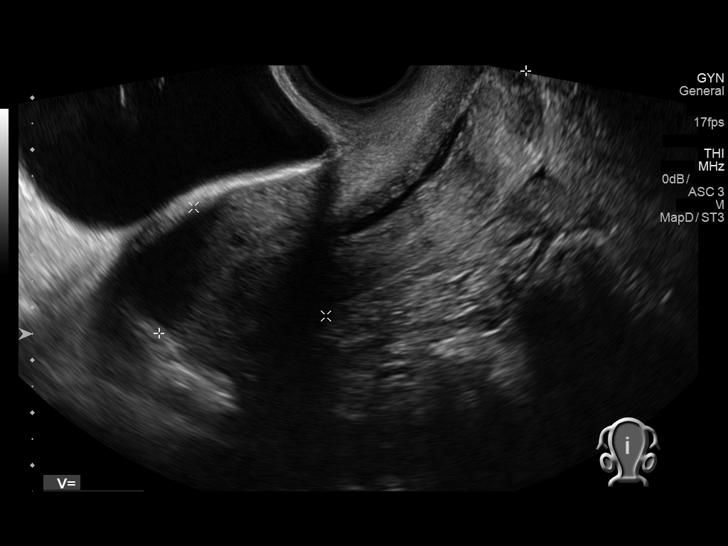
[im 54/123]
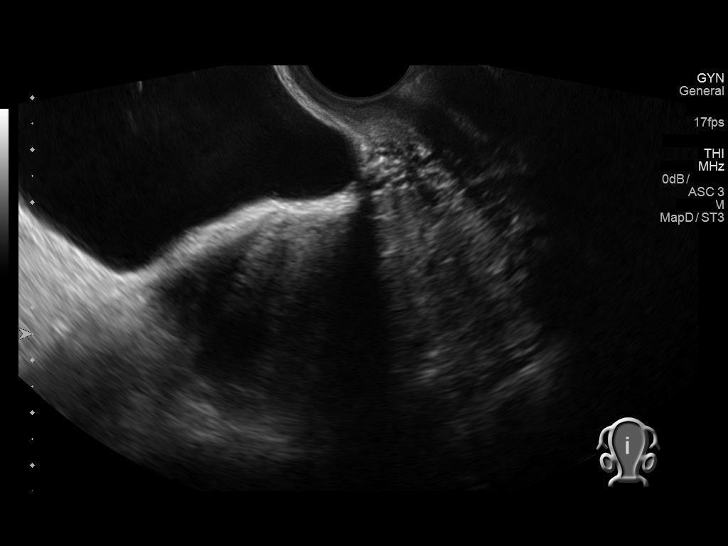
[im 64/123]
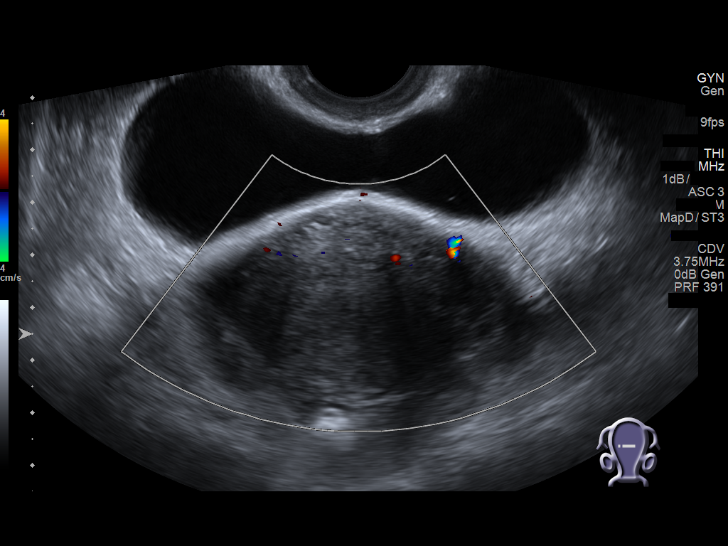
[im 75/123]
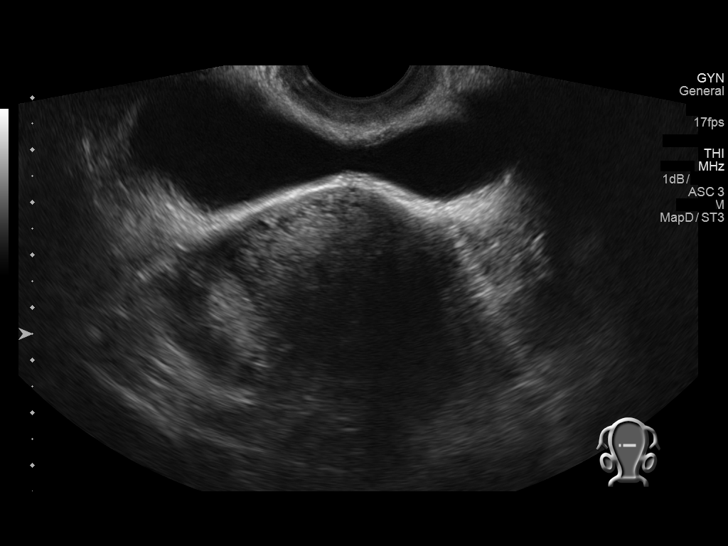
[im 85/123]
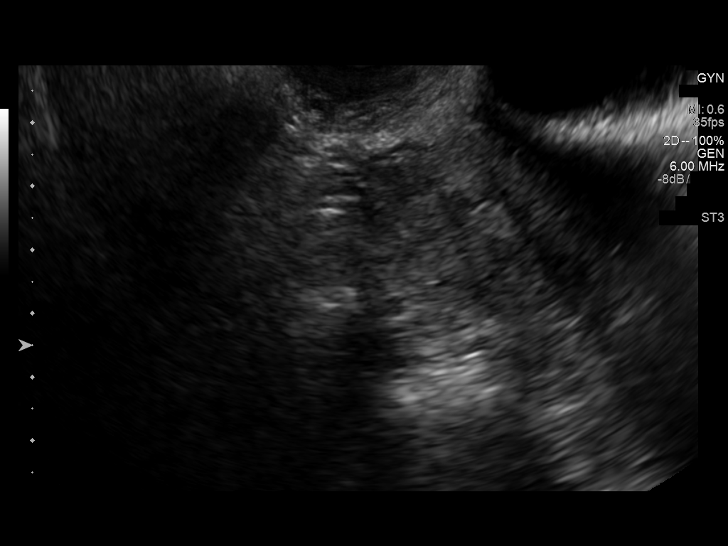
[im 96/123]
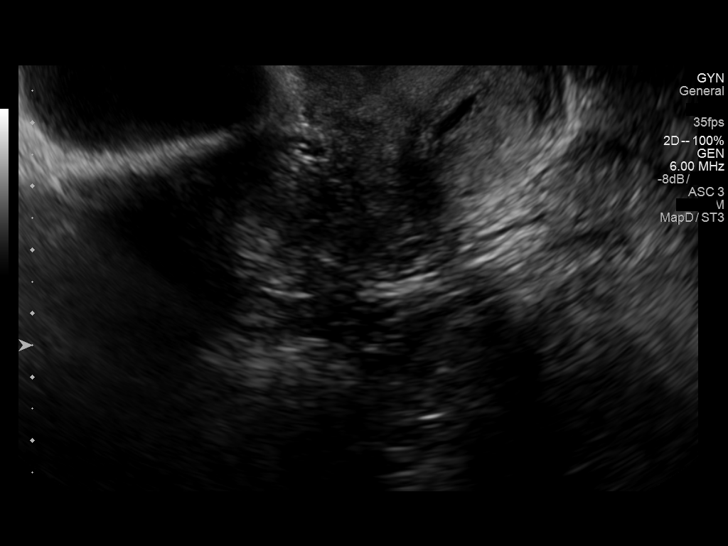
[im 107/123]
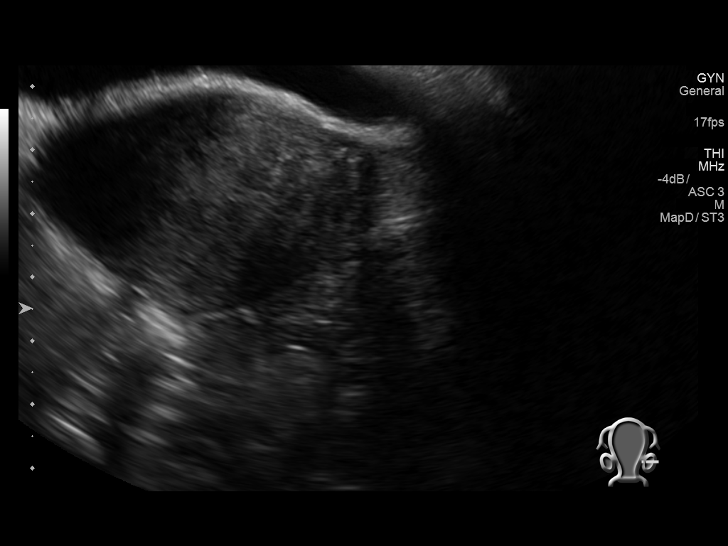
[im 117/123]
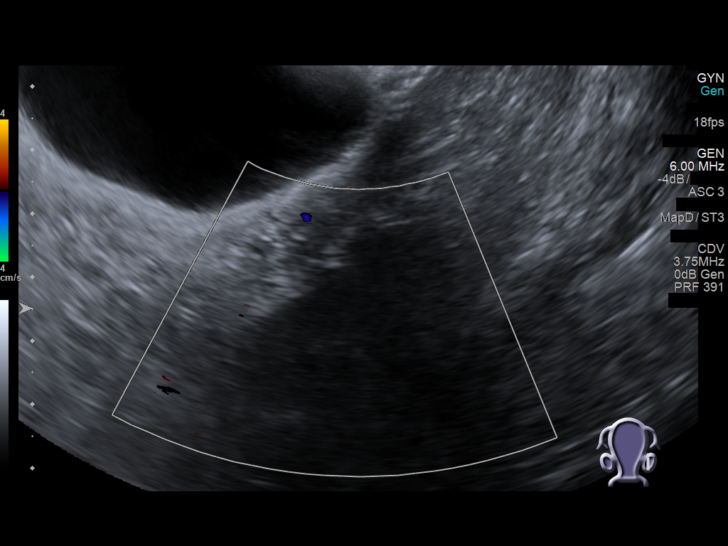

[Series 2: arterial and venous ultrasound of the abdomen pelv · 0.11mm/px · 1 of 4 slices shown (2 of 2)]
[im 1/4]
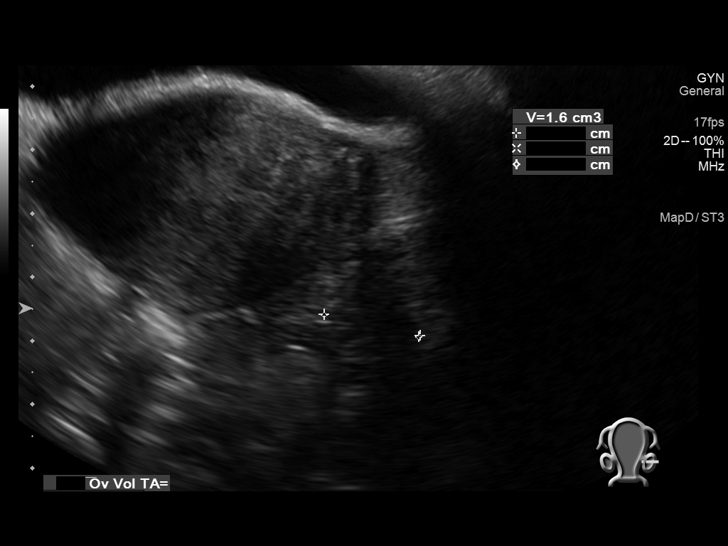

[13 of 25 positions shown; findings below may reference images not displayed]

FINDINGS: Uterus

Measurements: 8.6 x 3.3 x 4.8 cm = volume: 71 mL. No fibroids or
other mass visualized.

Endometrium

Thickness: 4 mm. No focal abnormality visualized. Small amount of
fluid within the endometrial canal extending into the endocervical
canal, of unlikely clinical significance.

Right ovary

Measurements: 1.8 x 1.2 x 1.1 cm = volume: 1.3 mL. Normal
appearance/no adnexal mass.

Left ovary

Measurements: 1.7 x 1.2 x 1.5 cm = volume: 1.6 mL. Normal
appearance/no adnexal mass.

Pulsed Doppler evaluation of both ovaries demonstrates normal
low-resistance arterial and venous waveforms.

Other findings

No abnormal free fluid.
IMPRESSION: No acute or significant findings. No evidence of ovarian torsion. No
mass or free fluid within either adnexal region.

## 2020-03-14 DIAGNOSIS — F419 Anxiety disorder, unspecified: Secondary | ICD-10-CM | POA: Diagnosis not present

## 2020-03-14 DIAGNOSIS — F410 Panic disorder [episodic paroxysmal anxiety] without agoraphobia: Secondary | ICD-10-CM | POA: Diagnosis not present

## 2020-03-20 DIAGNOSIS — F410 Panic disorder [episodic paroxysmal anxiety] without agoraphobia: Secondary | ICD-10-CM | POA: Diagnosis not present

## 2020-03-20 DIAGNOSIS — F419 Anxiety disorder, unspecified: Secondary | ICD-10-CM | POA: Diagnosis not present

## 2020-04-10 DIAGNOSIS — F419 Anxiety disorder, unspecified: Secondary | ICD-10-CM | POA: Diagnosis not present

## 2020-04-10 DIAGNOSIS — F410 Panic disorder [episodic paroxysmal anxiety] without agoraphobia: Secondary | ICD-10-CM | POA: Diagnosis not present

## 2020-04-24 DIAGNOSIS — F419 Anxiety disorder, unspecified: Secondary | ICD-10-CM | POA: Diagnosis not present

## 2020-05-06 DIAGNOSIS — R070 Pain in throat: Secondary | ICD-10-CM | POA: Diagnosis not present

## 2020-05-06 DIAGNOSIS — Z03818 Encounter for observation for suspected exposure to other biological agents ruled out: Secondary | ICD-10-CM | POA: Diagnosis not present

## 2020-05-06 DIAGNOSIS — Z20822 Contact with and (suspected) exposure to covid-19: Secondary | ICD-10-CM | POA: Diagnosis not present

## 2020-07-24 DIAGNOSIS — Z01419 Encounter for gynecological examination (general) (routine) without abnormal findings: Secondary | ICD-10-CM | POA: Diagnosis not present

## 2020-07-24 DIAGNOSIS — Z113 Encounter for screening for infections with a predominantly sexual mode of transmission: Secondary | ICD-10-CM | POA: Diagnosis not present

## 2020-07-24 DIAGNOSIS — Z114 Encounter for screening for human immunodeficiency virus [HIV]: Secondary | ICD-10-CM | POA: Diagnosis not present

## 2020-07-24 DIAGNOSIS — Z30431 Encounter for routine checking of intrauterine contraceptive device: Secondary | ICD-10-CM | POA: Diagnosis not present

## 2021-05-11 ENCOUNTER — Telehealth: Payer: BC Managed Care – PPO | Admitting: Physician Assistant

## 2021-05-11 DIAGNOSIS — U071 COVID-19: Secondary | ICD-10-CM | POA: Diagnosis not present

## 2021-05-11 MED ORDER — ALBUTEROL SULFATE HFA 108 (90 BASE) MCG/ACT IN AERS: 2.0000 | INHALATION_SPRAY | Freq: Four times a day (QID) | RESPIRATORY_TRACT | 0 refills | Status: DC | PRN

## 2021-05-11 MED ORDER — MOLNUPIRAVIR EUA 200MG CAPSULE: 4.0000 | ORAL_CAPSULE | Freq: Two times a day (BID) | ORAL | 0 refills | Status: AC

## 2021-05-11 MED ORDER — BENZONATATE 100 MG PO CAPS: 100.0000 mg | ORAL_CAPSULE | Freq: Three times a day (TID) | ORAL | 0 refills | Status: DC | PRN

## 2021-05-11 NOTE — Progress Notes (Signed)
Virtual Visit Consent   Lavene A Hernandes, you are scheduled for a virtual visit with a [facility] provider today.     Just as with appointments in the office, your consent must be obtained to participate.  Your consent will be active for this visit and any virtual visit you may have with one of our providers in the next 365 days.     If you have a MyChart account, a copy of this consent can be sent to you electronically.  All virtual visits are billed to your insurance company just like a traditional visit in the office.    As this is a virtual visit, video technology does not allow for your provider to perform a traditional examination.  This may limit your provider's ability to fully assess your condition.  If your provider identifies any concerns that need to be evaluated in person or the need to arrange testing (such as labs, EKG, etc.), we will make arrangements to do so.     Although advances in technology are sophisticated, we cannot ensure that it will always work on either your end or our end.  If the connection with a video visit is poor, the visit may have to be switched to a telephone visit.  With either a video or telephone visit, we are not always able to ensure that we have a secure connection.     I need to obtain your verbal consent now.   Are you willing to proceed with your visit today?    Zarriah A Griffith has provided verbal consent on 05/11/2021 for a virtual visit (video or telephone).   Leeanne Rio, [place]   Date: 05/11/2021 11:56 AM   Virtual Visit via Video Note   I, Leeanne Rio, connected with  Brehan Mcgivney Rabinovich  ([id number], [birth year]) on 05/11/21 at 12:00 PM EST by a video-enabled telemedicine application and verified that I am speaking with the correct person using two identifiers.  Location: Patient: Virtual Visit Location Patient: Home Provider: Virtual Visit Location Provider: Home Office   I discussed the limitations of evaluation and  management by telemedicine and the availability of in person appointments. The patient expressed understanding and agreed to proceed.    History of Present Illness: Marque A Botsford is a 30 y.o. who identifies as a female who was assigned female at birth, and is being seen today for COVID-19. Notes symptoms starting Friday evening with fatigue, sore throat and body aches, developing a fever overnight. Now with chills, headache, head and chest congestion. Took a home COVID test which was positive (Saturday). Denies chest pain but notes chest tightness. Denies overt SOB. Is alternating tylenol and Ibuprofen and is taking Mucinex. Has had her COVID vaccines and 1st booster. Marland Kitchen  HPI: HPI  Problems:  Patient Active Problem List   Diagnosis Date Noted   Alcohol abuse 11/07/2018   MDD (major depressive disorder), single episode, severe , no psychosis ([facility]) 11/07/2018   Suicidal behavior 11/06/2018   Suicide attempt ([facility]) 11/06/2018   Contraceptive management 10/18/2013   Vaginal irritation 10/18/2013   BV (bacterial vaginosis) 01/10/2013   Postcoital bleeding 01/10/2013    Allergies:  Allergies  Allergen Reactions   Sulfa Antibiotics Anaphylaxis, Hives and Swelling   Sesame Oil Hives and Swelling   Shellfish Allergy Hives and Swelling   Medications:  Current Outpatient Medications:    albuterol (VENTOLIN HFA) 108 (90 Base) MCG/ACT inhaler, Inhale 2 puffs into the lungs every 6 (six) hours as needed  for wheezing or shortness of breath., Disp: 8 g, Rfl: 0   benzonatate (TESSALON) 100 MG capsule, Take 1 capsule (100 mg total) by mouth 3 (three) times daily as needed for cough., Disp: 30 capsule, Rfl: 0   molnupiravir EUA (LAGEVRIO) 200 mg CAPS capsule, Take 4 capsules (800 mg total) by mouth 2 (two) times daily for 5 days., Disp: 40 capsule, Rfl: 0   buPROPion (WELLBUTRIN XL) 150 MG 24 hr tablet, Take 150 mg by mouth daily., Disp: , Rfl:    ezetimibe (ZETIA) 10 MG tablet, Take 10 mg by mouth at  bedtime., Disp: , Rfl:    fenofibrate (TRICOR) 145 MG tablet, Take 145 mg by mouth daily., Disp: , Rfl:    montelukast (SINGULAIR) 10 MG tablet, Take 10 mg by mouth daily., Disp: , Rfl:    traZODone (DESYREL) 50 MG tablet, Take 50-100 mg by mouth at bedtime., Disp: , Rfl:   Observations/Objective: Patient is well-developed, well-nourished in no acute distress.  Resting comfortably at home.  Head is normocephalic, atraumatic.  No labored breathing. Speech is clear and coherent with logical content.  Patient is alert and oriented at baseline.   Assessment and Plan: 1. COVID-19 - albuterol (VENTOLIN HFA) 108 (90 Base) MCG/ACT inhaler; Inhale 2 puffs into the lungs every 6 (six) hours as needed for wheezing or shortness of breath.  Dispense: 8 g; Refill: 0 - MyChart COVID-19 home monitoring program; Future - benzonatate (TESSALON) 100 MG capsule; Take 1 capsule (100 mg total) by mouth 3 (three) times daily as needed for cough.  Dispense: 30 capsule; Refill: 0 - molnupiravir EUA (LAGEVRIO) 200 mg CAPS capsule; Take 4 capsules (800 mg total) by mouth 2 (two) times daily for 5 days.  Dispense: 40 capsule; Refill: 0 Patient with multiple risk factors for complicated course of illness. Discussed risks/benefits of antiviral medications including most common potential ADRs. Patient voiced understanding and would like to proceed with antiviral medication. They are candidate for molnupiravir. Rx sent to pharmacy. Supportive measures, OTC medications and vitamin regimen reviewed. Tessalon and albuterol per orders . Patient has been enrolled in a MyChart COVID symptom monitoring program. Anne Shutter reviewed in detail. Strict ER precautions discussed with patient.    Follow Up Instructions: I discussed the assessment and treatment plan with the patient. The patient was provided an opportunity to ask questions and all were answered. The patient agreed with the plan and demonstrated an understanding of the  instructions.  A copy of instructions were sent to the patient via MyChart unless otherwise noted below.   The patient was advised to call back or seek an in-person evaluation if the symptoms worsen or if the condition fails to improve as anticipated.  Time:  I spent 12 minutes with the patient via telehealth technology discussing the above problems/concerns.    [name], PA-C

## 2021-05-11 NOTE — Patient Instructions (Signed)
[name], thank you for joining [name], PA-C for today's virtual visit.  While this provider is not your primary care provider (PCP), if your PCP is located in our provider database this encounter information will be shared with them immediately following your visit.  Consent: (Patient) Renee English provided verbal consent for this virtual visit at the beginning of the encounter.  Current Medications:  Current Outpatient Medications:    buPROPion (WELLBUTRIN XL) 150 MG 24 hr tablet, Take 150 mg by mouth daily., Disp: , Rfl:    ezetimibe (ZETIA) 10 MG tablet, Take 10 mg by mouth at bedtime., Disp: , Rfl:    fenofibrate (TRICOR) 145 MG tablet, Take 145 mg by mouth daily., Disp: , Rfl:    montelukast (SINGULAIR) 10 MG tablet, Take 10 mg by mouth daily., Disp: , Rfl:    traZODone (DESYREL) 50 MG tablet, Take 50-100 mg by mouth at bedtime., Disp: , Rfl:    Medications ordered in this encounter:  No orders of the defined types were placed in this encounter.    *If you need refills on other medications prior to your next appointment, please contact your pharmacy*  Follow-Up: Call back or seek an in-person evaluation if the symptoms worsen or if the condition fails to improve as anticipated.  Other Instructions Please keep well-hydrated and get plenty of rest. Start a saline nasal rinse to flush out your nasal passages. You can use plain Mucinex to help thin congestion. If you have a humidifier, running in the bedroom at night. I want you to start OTC vitamin D3 1000 units daily, vitamin C 1000 mg daily, and a zinc supplement. Please take prescribed medications as directed.  You have been enrolled in a MyChart symptom monitoring program. Please answer these questions daily so we can keep track of how you are doing.  You were to quarantine for 5 days from onset of your symptoms.  After day 5, if you have had no fever and you are feeling better, you can end  quarantine but need to mask for an additional 5 days. After day 5 if you have a fever or are having significant symptoms, please quarantine for full 10 days.  If you note any worsening of symptoms, any significant shortness of breath or any chest pain, please seek ER evaluation ASAP.  Please do not delay care!  COVID-19: What to Do if You Are Sick If you test positive and are an older adult or someone who is at high risk of getting very sick from COVID-19, treatment may be available. Contact a healthcare provider right away after a positive test to determine if you are eligible, even if your symptoms are mild right now. You can also visit a Test to Treat location and, if eligible, receive a prescription from a provider. Don't delay: Treatment must be started within the first few days to be effective. If you have a fever, cough, or other symptoms, you might have COVID-19. Most people have mild illness and are able to recover at home. If you are sick: Keep track of your symptoms. If you have an emergency warning sign (including trouble breathing), call 911. Steps to help prevent the spread of COVID-19 if you are sick If you are sick with COVID-19 or think you might have COVID-19, follow the steps below to care for yourself and to help protect other people in your home and community. Stay home except to get medical care Stay home. Most people with COVID-19  have mild illness and can recover at home without medical care. Do not leave your home, except to get medical care. Do not visit public areas and do not go to places where you are unable to wear a mask. Take care of yourself. Get rest and stay hydrated. Take over-the-counter medicines, such as acetaminophen, to help you feel better. Stay in touch with your doctor. Call before you get medical care. Be sure to get care if you have trouble breathing, or have any other emergency warning signs, or if you think it is an emergency. Avoid public  transportation, ride-sharing, or taxis if possible. Get tested If you have symptoms of COVID-19, get tested. While waiting for test results, stay away from others, including staying apart from those living in your household. Get tested as soon as possible after your symptoms start. Treatments may be available for people with COVID-19 who are at risk for becoming very sick. Don't delay: Treatment must be started early to be effective--some treatments must begin within 5 days of your first symptoms. Contact your healthcare provider right away if your test result is positive to determine if you are eligible. Self-tests are one of several options for testing for the virus that causes COVID-19 and may be more convenient than laboratory-based tests and point-of-care tests. Ask your healthcare provider or your local health department if you need help interpreting your test results. You can visit your state, tribal, local, and territorial health department's website to look for the latest local information on testing sites. Separate yourself from other people As much as possible, stay in a specific room and away from other people and pets in your home. If possible, you should use a separate bathroom. If you need to be around other people or animals in or outside of the home, wear a well-fitting mask. Tell your close contacts that they may have been exposed to COVID-19. An infected person can spread COVID-19 starting 48 hours (or 2 days) before the person has any symptoms or tests positive. By letting your close contacts know they may have been exposed to COVID-19, you are helping to protect everyone. See COVID-19 and Animals if you have questions about pets. If you are diagnosed with COVID-19, someone from the health department may call you. Answer the call to slow the spread. Monitor your symptoms Symptoms of COVID-19 include fever, cough, or other symptoms. Follow care instructions from your healthcare  provider and local health department. Your local health authorities may give instructions on checking your symptoms and reporting information. When to seek emergency medical attention Look for emergency warning signs* for COVID-19. If someone is showing any of these signs, seek emergency medical care immediately: Trouble breathing Persistent pain or pressure in the chest New confusion Inability to wake or stay awake Pale, gray, or blue-colored skin, lips, or nail beds, depending on skin tone *This list is not all possible symptoms. Please call your medical provider for any other symptoms that are severe or concerning to you. Call 911 or call ahead to your local emergency facility: Notify the operator that you are seeking care for someone who has or may have COVID-19. Call ahead before visiting your doctor Call ahead. Many medical visits for routine care are being postponed or done by phone or telemedicine. If you have a medical appointment that cannot be postponed, call your doctor's office, and tell them you have or may have COVID-19. This will help the office protect themselves and other patients. If you are sick,  wear a well-fitting mask You should wear a mask if you must be around other people or animals, including pets (even at home). Wear a mask with the best fit, protection, and comfort for you. You don't need to wear the mask if you are alone. If you can't put on a mask (because of trouble breathing, for example), cover your coughs and sneezes in some other way. Try to stay at least 6 feet away from other people. This will help protect the people around you. Masks should not be placed on young children under age 34 years, anyone who has trouble breathing, or anyone who is not able to remove the mask without help. Cover your coughs and sneezes Cover your mouth and nose with a tissue when you cough or sneeze. Throw away used tissues in a lined trash can. Immediately wash your hands with  soap and water for at least 20 seconds. If soap and water are not available, clean your hands with an alcohol-based hand sanitizer that contains at least 60% alcohol. Clean your hands often Wash your hands often with soap and water for at least 20 seconds. This is especially important after blowing your nose, coughing, or sneezing; going to the bathroom; and before eating or preparing food. Use hand sanitizer if soap and water are not available. Use an alcohol-based hand sanitizer with at least 60% alcohol, covering all surfaces of your hands and rubbing them together until they feel dry. Soap and water are the best option, especially if hands are visibly dirty. Avoid touching your eyes, nose, and mouth with unwashed hands. Handwashing Tips Avoid sharing personal household items Do not share dishes, drinking glasses, cups, eating utensils, towels, or bedding with other people in your home. Wash these items thoroughly after using them with soap and water or put in the dishwasher. Clean surfaces in your home regularly Clean and disinfect high-touch surfaces (for example, doorknobs, tables, handles, light switches, and countertops) in your "sick room" and bathroom. In shared spaces, you should clean and disinfect surfaces and items after each use by the person who is ill. If you are sick and cannot clean, a caregiver or other person should only clean and disinfect the area around you (such as your bedroom and bathroom) on an as needed basis. Your caregiver/other person should wait as long as possible (at least several hours) and wear a mask before entering, cleaning, and disinfecting shared spaces that you use. Clean and disinfect areas that may have blood, stool, or body fluids on them. Use household cleaners and disinfectants. Clean visible dirty surfaces with household cleaners containing soap or detergent. Then, use a household disinfectant. Use a product from [employer] List N: Disinfectants for  Coronavirus (COVID-19). Be sure to follow the instructions on the label to ensure safe and effective use of the product. Many products recommend keeping the surface wet with a disinfectant for a certain period of time (look at "contact time" on the product label). You may also need to wear personal protective equipment, such as gloves, depending on the directions on the product label. Immediately after disinfecting, wash your hands with soap and water for 20 seconds. For completed guidance on cleaning and disinfecting your home, visit Complete Disinfection Guidance. Take steps to improve ventilation at home Improve ventilation (air flow) at home to help prevent from spreading COVID-19 to other people in your household. Clear out COVID-19 virus particles in the air by opening windows, using air filters, and turning on fans in your home.  Use this interactive tool to learn how to improve air flow in your home. When you can be around others after being sick with COVID-19 Deciding when you can be around others is different for different situations. Find out when you can safely end home isolation. For any additional questions about your care, contact your healthcare provider or state or local health department. 09/23/2020 Content source: [facility] ([facility]), Division of Viral Diseases This information is not intended to replace advice given to you by your health care provider. Make sure you discuss any questions you have with your health care provider. Document Revised: 11/06/2020 Document Reviewed: 11/06/2020 Elsevier Patient Education  2022 [employer].      If you have been instructed to have an in-person evaluation today at a local Urgent Care facility, please use the link below. It will take you to a list of all of our available [facility], including address, phone number and hours of operation. Please do not delay care.  [facility]  If you or a family member do not have a primary care provider, use the link below to schedule a visit and establish care. When you choose a [facility] primary care physician or advanced practice provider, you gain a long-term partner in health. Find a Primary Care Provider  Learn more about 's in-office and virtual care options:  - Get Care Now

## 2021-08-05 DIAGNOSIS — R748 Abnormal levels of other serum enzymes: Secondary | ICD-10-CM

## 2021-08-05 HISTORY — DX: Abnormal levels of other serum enzymes: R74.8

## 2021-08-12 ENCOUNTER — Telehealth: Payer: BC Managed Care – PPO | Admitting: Physician Assistant

## 2021-08-12 DIAGNOSIS — R1031 Right lower quadrant pain: Secondary | ICD-10-CM | POA: Diagnosis not present

## 2021-08-12 DIAGNOSIS — J029 Acute pharyngitis, unspecified: Secondary | ICD-10-CM

## 2021-08-12 NOTE — Progress Notes (Signed)
Virtual Visit Consent   Renee English, you are scheduled for a virtual visit with a [facility] provider today.     Just as with appointments in the office, your consent must be obtained to participate.  Your consent will be active for this visit and any virtual visit you may have with one of our providers in the next 365 days.     If you have a MyChart account, a copy of this consent can be sent to you electronically.  All virtual visits are billed to your insurance company just like a traditional visit in the office.    As this is a virtual visit, video technology does not allow for your provider to perform a traditional examination.  This may limit your provider's ability to fully assess your condition.  If your provider identifies any concerns that need to be evaluated in person or the need to arrange testing (such as labs, EKG, etc.), we will make arrangements to do so.     Although advances in technology are sophisticated, we cannot ensure that it will always work on either your end or our end.  If the connection with a video visit is poor, the visit may have to be switched to a telephone visit.  With either a video or telephone visit, we are not always able to ensure that we have a secure connection.     I need to obtain your verbal consent now.   Are you willing to proceed with your visit today?    Renee English has provided verbal consent on 08/12/2021 for a virtual visit (video or telephone).   Margaretann Loveless, PA-C   Date: 08/12/2021 11:56 AM   Virtual Visit via Video Note   I, Margaretann Loveless, connected with  Renee English  ([id number], 1991/03/16) on 08/12/21 at 11:45 AM EST by a video-enabled telemedicine application and verified that I am speaking with the correct person using two identifiers.  Location: Patient: Virtual Visit Location Patient: Mobile Provider: Virtual Visit Location Provider: Home Office   I discussed the limitations of evaluation and  management by telemedicine and the availability of in person appointments. The patient expressed understanding and agreed to proceed.    History of Present Illness: Renee English is a 31 y.o. who identifies as a female who was assigned female at birth, and is being seen today for RLQ pain and sore throat.   RLQ pain: Started 2-3 days ago. Is a constant dull ache in the RLQ but sometimes becomes very sharp and can radiate to the umbilical area. No other associated symptoms she is aware of. Does have IUD.   Sore throat: Started yesterday and still present this morning. No other URI symptoms present. Denies swelling or white exudate.    Problems:  Patient Active Problem List   Diagnosis Date Noted   Alcohol abuse 11/07/2018   MDD (major depressive disorder), single episode, severe , no psychosis ([facility]) 11/07/2018   Suicidal behavior 11/06/2018   Suicide attempt ([facility]) 11/06/2018   Contraceptive management 10/18/2013   Vaginal irritation 10/18/2013   BV (bacterial vaginosis) 01/10/2013   Postcoital bleeding 01/10/2013    Allergies:  Allergies  Allergen Reactions   Sulfa Antibiotics Anaphylaxis, Hives and Swelling   Sesame Oil Hives and Swelling   Shellfish Allergy Hives and Swelling   Medications:  Current Outpatient Medications:    albuterol (VENTOLIN HFA) 108 (90 Base) MCG/ACT inhaler, Inhale 2 puffs into the lungs every 6 (six) hours  as needed for wheezing or shortness of breath., Disp: 8 g, Rfl: 0   benzonatate (TESSALON) 100 MG capsule, Take 1 capsule (100 mg total) by mouth 3 (three) times daily as needed for cough., Disp: 30 capsule, Rfl: 0   buPROPion (WELLBUTRIN XL) 150 MG 24 hr tablet, Take 150 mg by mouth daily., Disp: , Rfl:    ezetimibe (ZETIA) 10 MG tablet, Take 10 mg by mouth at bedtime., Disp: , Rfl:    fenofibrate (TRICOR) 145 MG tablet, Take 145 mg by mouth daily., Disp: , Rfl:    montelukast (SINGULAIR) 10 MG tablet, Take 10 mg by mouth daily., Disp: , Rfl:     traZODone (DESYREL) 50 MG tablet, Take 50-100 mg by mouth at bedtime., Disp: , Rfl:   Observations/Objective: Patient is well-developed, well-nourished in no acute distress.  Resting comfortably at work Head is normocephalic, atraumatic.  No labored breathing.  Speech is clear and coherent with logical content.  Patient is alert and oriented at baseline.    Assessment and Plan: 1. Pain, abdominal, RLQ  2. Sore throat  - Advised to call OB/GYN if she has one, or be seen at local ER as soon as possible for evaluation of RLQ pain (DDx: ovarian cyst, ectopic pregnancy, subacute appendicitis, constipation, PID) - Sore throat suspected to be viral in nature.  - Advised OTC symptomatic management of choice, salt water gargles  Follow Up Instructions: I discussed the assessment and treatment plan with the patient. The patient was provided an opportunity to ask questions and all were answered. The patient agreed with the plan and demonstrated an understanding of the instructions.  A copy of instructions were sent to the patient via MyChart unless otherwise noted below.    The patient was advised to call back or seek an in-person evaluation if the symptoms worsen or if the condition fails to improve as anticipated.  Time:  I spent 15 minutes with the patient via telehealth technology discussing the above problems/concerns.    Mar Daring, PA-C

## 2021-08-12 NOTE — Patient Instructions (Signed)
Renee English, thank you for joining Margaretann Loveless, PA-C for today's virtual visit.  While this provider is not your primary care provider (PCP), if your PCP is located in our provider database this encounter information will be shared with them immediately following your visit.  Consent: (Patient) Renee English provided verbal consent for this virtual visit at the beginning of the encounter.  Current Medications:  Current Outpatient Medications:    albuterol (VENTOLIN HFA) 108 (90 Base) MCG/ACT inhaler, Inhale 2 puffs into the lungs every 6 (six) hours as needed for wheezing or shortness of breath., Disp: 8 g, Rfl: 0   benzonatate (TESSALON) 100 MG capsule, Take 1 capsule (100 mg total) by mouth 3 (three) times daily as needed for cough., Disp: 30 capsule, Rfl: 0   buPROPion (WELLBUTRIN XL) 150 MG 24 hr tablet, Take 150 mg by mouth daily., Disp: , Rfl:    ezetimibe (ZETIA) 10 MG tablet, Take 10 mg by mouth at bedtime., Disp: , Rfl:    fenofibrate (TRICOR) 145 MG tablet, Take 145 mg by mouth daily., Disp: , Rfl:    montelukast (SINGULAIR) 10 MG tablet, Take 10 mg by mouth daily., Disp: , Rfl:    traZODone (DESYREL) 50 MG tablet, Take 50-100 mg by mouth at bedtime., Disp: , Rfl:    Medications ordered in this encounter:  No orders of the defined types were placed in this encounter.    *If you need refills on other medications prior to your next appointment, please contact your pharmacy*  Follow-Up: Call back or seek an in-person evaluation if the symptoms worsen or if the condition fails to improve as anticipated.  Other Instructions Abdominal Pain, Adult Pain in the abdomen (abdominal pain) can be caused by many things. Often, abdominal pain is not serious and it gets better with no treatment or by being treated at home. However, sometimes abdominal pain is serious. Your health care provider will ask questions about your medical history and do a physical exam to try to  determine the cause of your abdominal pain. Follow these instructions at home: Medicines Take over-the-counter and prescription medicines only as told by your health care provider. Do not take a laxative unless told by your health care provider. General instructions  Watch your condition for any changes. Drink enough fluid to keep your urine pale yellow. Keep all follow-up visits as told by your health care provider. This is important. Contact a health care provider if: Your abdominal pain changes or gets worse. You are not hungry or you lose weight without trying. You are constipated or have diarrhea for more than 2-3 days. You have pain when you urinate or have a bowel movement. Your abdominal pain wakes you up at night. Your pain gets worse with meals, after eating, or with certain foods. You are vomiting and cannot keep anything down. You have a fever. You have blood in your urine. Get help right away if: Your pain does not go away as soon as your health care provider told you to expect. You cannot stop vomiting. Your pain is only in areas of the abdomen, such as the right side or the left lower portion of the abdomen. Pain on the right side could be caused by appendicitis. You have bloody or black stools, or stools that look like tar. You have severe pain, cramping, or bloating in your abdomen. You have signs of dehydration, such as: Dark urine, very little urine, or no urine. Cracked lips. Dry mouth.  Sunken eyes. Sleepiness. Weakness. You have trouble breathing or chest pain. Summary Often, abdominal pain is not serious and it gets better with no treatment or by being treated at home. However, sometimes abdominal pain is serious. Watch your condition for any changes. Take over-the-counter and prescription medicines only as told by your health care provider. Contact a health care provider if your abdominal pain changes or gets worse. Get help right away if you have severe  pain, cramping, or bloating in your abdomen. This information is not intended to replace advice given to you by your health care provider. Make sure you discuss any questions you have with your health care provider. Document Revised: 08/10/2019 Document Reviewed: 10/30/2018 Elsevier Patient Education  2022 [employer].    If you have been instructed to have an in-person evaluation today at a local Urgent Care facility, please use the link below. It will take you to a list of all of our available [facility], including address, phone number and hours of operation. Please do not delay care.  [facility]  If you or a family member do not have a primary care provider, use the link below to schedule a visit and establish care. When you choose a [facility] primary care physician or advanced practice provider, you gain a long-term partner in health. Find a Primary Care Provider  Learn more about [facility]'s in-office and virtual care options: [facility] - Get Care Now

## 2021-08-13 ENCOUNTER — Emergency Department (HOSPITAL_COMMUNITY)
Admission: EM | Admit: 2021-08-13 | Discharge: 2021-08-13 | Disposition: A | Discharge status: Home / Self Care | Payer: BC Managed Care – PPO | Attending: Emergency Medicine | Admitting: Emergency Medicine

## 2021-08-13 ENCOUNTER — Encounter (HOSPITAL_COMMUNITY): Payer: Self-pay | Admitting: *Deleted

## 2021-08-13 ENCOUNTER — Emergency Department (HOSPITAL_COMMUNITY): Payer: BC Managed Care – PPO

## 2021-08-13 DIAGNOSIS — R109 Unspecified abdominal pain: Secondary | ICD-10-CM | POA: Diagnosis not present

## 2021-08-13 DIAGNOSIS — R1031 Right lower quadrant pain: Secondary | ICD-10-CM | POA: Diagnosis not present

## 2021-08-13 DIAGNOSIS — N200 Calculus of kidney: Secondary | ICD-10-CM | POA: Diagnosis not present

## 2021-08-13 DIAGNOSIS — N1330 Unspecified hydronephrosis: Secondary | ICD-10-CM | POA: Diagnosis not present

## 2021-08-13 DIAGNOSIS — Z79899 Other long term (current) drug therapy: Secondary | ICD-10-CM | POA: Insufficient documentation

## 2021-08-13 LAB — COMPREHENSIVE METABOLIC PANEL
ALT: 100 U/L — ABNORMAL HIGH (ref 0–44)
AST: 49 U/L — ABNORMAL HIGH (ref 15–41)
Albumin: 4.6 g/dL (ref 3.5–5.0)
Alkaline Phosphatase: 52 U/L (ref 38–126)
Anion gap: 9 (ref 5–15)
BUN: 13 mg/dL (ref 6–20)
CO2: 25 mmol/L (ref 22–32)
Calcium: 9.3 mg/dL (ref 8.9–10.3)
Chloride: 100 mmol/L (ref 98–111)
Creatinine, Ser: 0.86 mg/dL (ref 0.44–1.00)
GFR, Estimated: >60 mL/min (ref >60)
Glucose, Bld: 115 mg/dL — ABNORMAL HIGH (ref 70–99)
Potassium: 3.9 mmol/L (ref 3.5–5.1)
Sodium: 134 mmol/L — ABNORMAL LOW (ref 135–145)
Total Bilirubin: 0.6 mg/dL (ref 0.3–1.2)
Total Protein: 7.6 g/dL (ref 6.5–8.1)

## 2021-08-13 LAB — CBC
HCT: 42.4 % (ref 36.0–46.0)
Hemoglobin: 14.3 g/dL (ref 12.0–15.0)
MCH: 31.9 pg (ref 26.0–34.0)
MCHC: 33.7 g/dL (ref 30.0–36.0)
MCV: 94.6 fL (ref 80.0–100.0)
Platelets: 320 10*3/uL (ref 150–400)
RBC: 4.48 MIL/uL (ref 3.87–5.11)
RDW: 12 % (ref 11.5–15.5)
WBC: 10.2 10*3/uL (ref 4.0–10.5)
nRBC: 0 % (ref 0.0–0.2)

## 2021-08-13 LAB — URINALYSIS, ROUTINE W REFLEX MICROSCOPIC
Bilirubin Urine: NEGATIVE
Glucose, UA: NEGATIVE mg/dL
Ketones, ur: NEGATIVE mg/dL
Leukocytes,Ua: NEGATIVE
Nitrite: NEGATIVE
Protein, ur: 30 mg/dL — AB
Specific Gravity, Urine: 1.021 (ref 1.005–1.030)
pH: 7 (ref 5.0–8.0)

## 2021-08-13 LAB — HCG, QUANTITATIVE, PREGNANCY: hCG, Beta Chain, Quant, S: 1 m[IU]/mL (ref <5)

## 2021-08-13 LAB — LIPASE, BLOOD: Lipase: 27 U/L (ref 11–51)

## 2021-08-13 MED ORDER — OXYCODONE-ACETAMINOPHEN 5-325 MG PO TABS: 1.0000 | ORAL_TABLET | Freq: Four times a day (QID) | ORAL | 0 refills | Status: AC | PRN

## 2021-08-13 MED ORDER — KETOROLAC TROMETHAMINE 15 MG/ML IJ SOLN
15.0000 mg | Freq: Once | INTRAMUSCULAR | Status: AC
  Administered 2021-08-13: 15 mg via INTRAVENOUS
  Filled 2021-08-13: qty 1

## 2021-08-13 MED ORDER — MORPHINE SULFATE (PF) 4 MG/ML IV SOLN
4.0000 mg | Freq: Once | INTRAVENOUS | Status: AC
  Administered 2021-08-13: 4 mg via INTRAVENOUS
  Filled 2021-08-13: qty 1

## 2021-08-13 MED ORDER — IOHEXOL 300 MG/ML  SOLN
100.0000 mL | Freq: Once | INTRAMUSCULAR | Status: AC | PRN
  Administered 2021-08-13: 100 mL via INTRAVENOUS

## 2021-08-13 MED ORDER — ONDANSETRON HCL 4 MG/2ML IJ SOLN
4.0000 mg | Freq: Once | INTRAMUSCULAR | Status: AC
  Administered 2021-08-13: 4 mg via INTRAVENOUS
  Filled 2021-08-13: qty 2

## 2021-08-13 NOTE — ED Provider Notes (Addendum)
[facility] EMERGENCY DEPARTMENT Provider Note   CSN: [id number] Arrival date & time: 08/13/21  1610     History  Chief Complaint  Patient presents with   Abdominal Pain    Renee English is a 31 y.o. female with history including kidney stones, UTI, and history of alcohol use disorder presenting for evaluation of a 4-day history of right lower quadrant abdominal pain.  She describes a cramping localized pain in her right lower quadrant with which initially was tolerable and intermittent, over the past 4 days it has become more intense with episodes of sharp stabbing pain that radiates into her umbilical area.  Today while at work it escalated and has become constant and also encompasses her right lateral flank region.  She endorses nausea without emesis and has noted anorexia.  She has had no fevers or chills.  She denies constipation or diarrhea, she has had some mild abdominal distention.  She denies dysuria or vaginal discharge.  She denies risk factors for STDs.  She denies pregnancy, she has an IUD in place.  She has tried taking gas relievers, ibuprofen and Midol without improvement in symptoms.  The history is provided by the patient.      Home Medications Prior to Admission medications   Medication Sig Start Date End Date Taking? Authorizing Provider  albuterol (VENTOLIN HFA) 108 (90 Base) MCG/ACT inhaler Inhale 2 puffs into the lungs every 6 (six) hours as needed for wheezing or shortness of breath. 05/11/21   Brunetta Jeans, PA-C  benzonatate (TESSALON) 100 MG capsule Take 1 capsule (100 mg total) by mouth 3 (three) times daily as needed for cough. 05/11/21   Brunetta Jeans, PA-C  buPROPion (WELLBUTRIN XL) 150 MG 24 hr tablet Take 150 mg by mouth daily. 02/17/21   [provider]  ezetimibe (ZETIA) 10 MG tablet Take 10 mg by mouth at bedtime. 03/17/21   [provider]  fenofibrate (TRICOR) 145 MG tablet Take 145 mg by mouth daily. 02/17/21   [provider]  montelukast (SINGULAIR) 10 MG tablet Take 10 mg by mouth daily. 04/17/21   [provider]  traZODone (DESYREL) 50 MG tablet Take 50-100 mg by mouth at bedtime. 04/17/21   [provider]      Allergies    Sulfa antibiotics, Sesame oil, and Shellfish allergy    Review of Systems   Review of Systems  Constitutional:  Positive for appetite change. Negative for chills and fever.  HENT:  Negative for congestion and sore throat.   Eyes: Negative.   Respiratory:  Negative for chest tightness and shortness of breath.   Cardiovascular:  Negative for chest pain.  Gastrointestinal:  Positive for abdominal distention, abdominal pain and nausea. Negative for constipation, diarrhea and vomiting.  Genitourinary: Negative.  Negative for dysuria and vaginal discharge.  Musculoskeletal:  Negative for arthralgias, joint swelling and neck pain.  Skin: Negative.  Negative for rash and wound.  Neurological:  Negative for dizziness, weakness, light-headedness, numbness and headaches.  Psychiatric/Behavioral: Negative.     Physical Exam Updated Vital Signs BP 117/65 (BP Location: Right Arm)    Pulse 71    Temp 98.5 F (36.9 C) (Oral)    Resp 17    Ht 5\' 2"  (1.575 m)    Wt 80.5 kg    SpO2 99%    BMI 32.46 kg/m  Physical Exam Vitals and nursing note reviewed.  Constitutional:      Appearance: She is well-developed.  HENT:  Head: Normocephalic and atraumatic.  Eyes:     Conjunctiva/sclera: Conjunctivae normal.  Cardiovascular:     Rate and Rhythm: Normal rate and regular rhythm.     Heart sounds: Normal heart sounds.  Pulmonary:     Effort: Pulmonary effort is normal.     Breath sounds: Normal breath sounds. No wheezing.  Abdominal:     General: Bowel sounds are normal. There is no distension.     Palpations: Abdomen is soft.     Tenderness: There is abdominal tenderness in the right lower quadrant. There is guarding. Positive signs include psoas sign.      Hernia: No hernia is present.  Musculoskeletal:        General: Normal range of motion.     Cervical back: Normal range of motion.  Skin:    General: Skin is warm and dry.  Neurological:     Mental Status: She is alert.    ED Results / Procedures / Treatments   Labs (all labs ordered are listed, but only abnormal results are displayed) Labs Reviewed  COMPREHENSIVE METABOLIC PANEL - Abnormal; Notable for the following components:      Result Value   Sodium 134 (*)    Glucose, Bld 115 (*)    AST 49 (*)    ALT 100 (*)    All other components within normal limits  LIPASE, BLOOD  CBC  URINALYSIS, ROUTINE W REFLEX MICROSCOPIC  HCG, QUANTITATIVE, PREGNANCY    EKG None  Radiology No results found.  Procedures Procedures    Medications Ordered in ED Medications  morphine (PF) 4 MG/ML injection 4 mg (has no administration in time range)  ondansetron (ZOFRAN) injection 4 mg (has no administration in time range)    ED Course/ Medical Decision Making/ A&P                           Medical Decision Making Pt with right lower quadrant abdominal pain along with nausea without emesis, anorexia.  Concerning for localizing right lower quadrant process such as appendicitis or ovarian cyst/rupture given escalation of pain today.  Denies risk for STDs, I doubt PID, no surgical history, small bowel obstruction unlikely.  Labs and CT imaging are currently pending.  Discussed with Debbe Mounts PA-C who assumes care.  Amount and/or Complexity of Data Reviewed Labs: ordered.    Details: LFTs are elevated with an ALT of 100 and AST of 49.  Discussed with patient these findings.  She states that she was on Zetia until approximately 2 weeks ago.  She was taken off this medication because her LFTs were in the 500 range.  She denies pain in her right upper quadrant. Radiology: ordered.  Risk Prescription drug management.          Final Clinical Impression(s) / ED  Diagnoses Final diagnoses:  None    Rx / DC Orders ED Discharge Orders     None         Landis Martins 08/13/21 1920    Evalee Jefferson, PA-C 08/13/21 1930    Noemi Chapel, MD 08/14/21 [phone]

## 2021-08-13 NOTE — ED Triage Notes (Signed)
Right lower quadrant pain x 3 days, nausea no vomiting

## 2021-08-13 NOTE — ED Provider Notes (Signed)
°  Care of patient assumed from [facility] at 1900.  Agree with history, physical exam and plan.  See their note for further details. Briefly, presents to the emergency department with a chief plaint of right lower quadrant pain.  Pain is present over the last 4 days and has become more intense with episodes of sharp stabbing that radiates into her umbilical area.   Physical Exam  BP 118/80 (BP Location: Left Arm)    Pulse 85    Temp 98.5 F (36.9 C) (Oral)    Resp 17    Ht 5\' 2"  (1.575 m)    Wt 80.5 kg    SpO2 96%    BMI 32.46 kg/m   Physical Exam Vitals and nursing note reviewed.  Constitutional:      General: She is not in acute distress.    Appearance: She is not ill-appearing, toxic-appearing or diaphoretic.  HENT:     Head: Normocephalic.  Eyes:     General: No scleral icterus.       Right eye: No discharge.        Left eye: No discharge.  Cardiovascular:     Rate and Rhythm: Normal rate.  Pulmonary:     Effort: Pulmonary effort is normal.  Abdominal:     General: Abdomen is flat.     Palpations: Abdomen is soft.     Tenderness: There is abdominal tenderness in the right lower quadrant. There is no guarding or rebound.     Comments: Numbness to right lower quadrant  Skin:    General: Skin is warm and dry.  Neurological:     General: No focal deficit present.     Mental Status: She is alert.  Psychiatric:        Behavior: Behavior is cooperative.    Procedures  Procedures  ED Course / MDM    Medical Decision Making Amount and/or Complexity of Data Reviewed Labs: ordered. Radiology: ordered.  Risk Prescription drug management.   At time of handoff CT imaging is pending.  Shows 4 mm right distal ureteral calculus at the level of the sacral ureter.  Associated mild right hydronephrosis.  Noted to have rare bacteria in UA however squamous epithelial 11-20 present.  Creatinine within normal limits and no leukocytosis.  Patient denies any dysuria, hematuria, urinary  frequency.  Antibiotics were considered however will hold at this time.  Patient abdomen soft, nondistended, tenderness to right lower quadrant.  Patient appears stable at this time.  Will give patient prescription for Toradol as she was not able to pick up pain medication until tomorrow morning.  Will prescribe patient with 3-day course of Percocet.  Patient unable to be prescribed Flomax due to sulfa allergy.  Patient will need to follow-up with [facility] in the outpatient setting.  Information was discussed with patient and patient's mother at bedside.  Discussed results, findings, treatment and follow up. Patient advised of return precautions. Patient verbalized understanding and agreed with plan.        Dyann Ruddle 08/13/21 2140    Noemi Chapel, MD 08/14/21 [phone]

## 2021-08-13 NOTE — Discharge Instructions (Addendum)
[facility] today to be evaluated for your right abdominal and flank pain.  Your CT scan shows that you have a 4 mm stone in your right ureter.  Due to this I have given you a prescription for pain medication.  Please take this medication as prescribed.  You will need to follow-up with [facility] in the outpatient setting.  Please call the office tomorrow to schedule a follow-up appointment.  Get help right away if: You have a fever or chills. You develop severe pain. You develop new abdominal pain. You faint. You are unable to urinate.

## 2021-08-20 ENCOUNTER — Encounter (HOSPITAL_COMMUNITY): Payer: Self-pay | Admitting: *Deleted

## 2021-08-20 ENCOUNTER — Other Ambulatory Visit: Payer: Self-pay

## 2021-08-20 ENCOUNTER — Emergency Department (HOSPITAL_COMMUNITY)
Admission: EM | Admit: 2021-08-20 | Discharge: 2021-08-20 | Disposition: A | Discharge status: Home / Self Care | Payer: BC Managed Care – PPO | Attending: Student | Admitting: Student

## 2021-08-20 DIAGNOSIS — R1031 Right lower quadrant pain: Secondary | ICD-10-CM | POA: Diagnosis not present

## 2021-08-20 DIAGNOSIS — R7401 Elevation of levels of liver transaminase levels: Secondary | ICD-10-CM | POA: Diagnosis not present

## 2021-08-20 DIAGNOSIS — N201 Calculus of ureter: Secondary | ICD-10-CM | POA: Diagnosis not present

## 2021-08-20 DIAGNOSIS — N132 Hydronephrosis with renal and ureteral calculous obstruction: Secondary | ICD-10-CM | POA: Insufficient documentation

## 2021-08-20 DIAGNOSIS — R109 Unspecified abdominal pain: Secondary | ICD-10-CM | POA: Diagnosis not present

## 2021-08-20 LAB — CBC WITH DIFFERENTIAL/PLATELET
Abs Immature Granulocytes: 0.01 10*3/uL (ref 0.00–0.07)
Basophils Absolute: 0.1 10*3/uL (ref 0.0–0.1)
Basophils Relative: 1 %
Eosinophils Absolute: 0.1 10*3/uL (ref 0.0–0.5)
Eosinophils Relative: 1 %
HCT: 42.3 % (ref 36.0–46.0)
Hemoglobin: 14 g/dL (ref 12.0–15.0)
Immature Granulocytes: 0 %
Lymphocytes Relative: 23 %
Lymphs Abs: 1.7 10*3/uL (ref 0.7–4.0)
MCH: 31.3 pg (ref 26.0–34.0)
MCHC: 33.1 g/dL (ref 30.0–36.0)
MCV: 94.4 fL (ref 80.0–100.0)
Monocytes Absolute: 0.5 10*3/uL (ref 0.1–1.0)
Monocytes Relative: 6 %
Neutro Abs: 5.1 10*3/uL (ref 1.7–7.7)
Neutrophils Relative %: 69 %
Platelets: 284 10*3/uL (ref 150–400)
RBC: 4.48 MIL/uL (ref 3.87–5.11)
RDW: 11.9 % (ref 11.5–15.5)
WBC: 7.4 10*3/uL (ref 4.0–10.5)
nRBC: 0 % (ref 0.0–0.2)

## 2021-08-20 LAB — URINALYSIS, ROUTINE W REFLEX MICROSCOPIC
Bilirubin Urine: NEGATIVE
Glucose, UA: NEGATIVE mg/dL
Ketones, ur: NEGATIVE mg/dL
Leukocytes,Ua: NEGATIVE
Nitrite: NEGATIVE
Protein, ur: NEGATIVE mg/dL
RBC / HPF: >50 RBC/hpf — ABNORMAL HIGH (ref 0–5)
Specific Gravity, Urine: 1.009 (ref 1.005–1.030)
pH: 7 (ref 5.0–8.0)

## 2021-08-20 LAB — COMPREHENSIVE METABOLIC PANEL
ALT: 180 U/L — ABNORMAL HIGH (ref 0–44)
AST: 117 U/L — ABNORMAL HIGH (ref 15–41)
Albumin: 4.6 g/dL (ref 3.5–5.0)
Alkaline Phosphatase: 52 U/L (ref 38–126)
Anion gap: 11 (ref 5–15)
BUN: 10 mg/dL (ref 6–20)
CO2: 24 mmol/L (ref 22–32)
Calcium: 9.3 mg/dL (ref 8.9–10.3)
Chloride: 101 mmol/L (ref 98–111)
Creatinine, Ser: 0.77 mg/dL (ref 0.44–1.00)
GFR, Estimated: >60 mL/min (ref >60)
Glucose, Bld: 86 mg/dL (ref 70–99)
Potassium: 3.9 mmol/L (ref 3.5–5.1)
Sodium: 136 mmol/L (ref 135–145)
Total Bilirubin: 0.7 mg/dL (ref 0.3–1.2)
Total Protein: 7.7 g/dL (ref 6.5–8.1)

## 2021-08-20 LAB — PREGNANCY, URINE: Preg Test, Ur: NEGATIVE

## 2021-08-20 MED ORDER — ONDANSETRON HCL 4 MG/2ML IJ SOLN
4.0000 mg | Freq: Once | INTRAMUSCULAR | Status: AC
  Administered 2021-08-20: 4 mg via INTRAVENOUS
  Filled 2021-08-20: qty 2

## 2021-08-20 MED ORDER — MORPHINE SULFATE (PF) 4 MG/ML IV SOLN
4.0000 mg | Freq: Once | INTRAVENOUS | Status: AC
  Administered 2021-08-20: 4 mg via INTRAVENOUS
  Filled 2021-08-20: qty 1

## 2021-08-20 MED ORDER — ONDANSETRON 4 MG PO TBDP: ORAL_TABLET | ORAL | 0 refills | Status: DC

## 2021-08-20 MED ORDER — KETOROLAC TROMETHAMINE 30 MG/ML IJ SOLN
30.0000 mg | Freq: Once | INTRAMUSCULAR | Status: AC
  Administered 2021-08-20: 30 mg via INTRAVENOUS
  Filled 2021-08-20: qty 1

## 2021-08-20 MED ORDER — IBUPROFEN 600 MG PO TABS: 600.0000 mg | ORAL_TABLET | Freq: Four times a day (QID) | ORAL | 0 refills | Status: DC | PRN

## 2021-08-20 MED ORDER — OXYCODONE-ACETAMINOPHEN 5-325 MG PO TABS: 1.0000 | ORAL_TABLET | Freq: Four times a day (QID) | ORAL | 0 refills | Status: DC | PRN

## 2021-08-20 NOTE — Discharge Instructions (Addendum)
Take ibuprofen every 6 hours and then use oxycodone with Tylenol every 6 hours as needed for pain.  Zofran as needed for nausea and vomiting.  Since you are allergic to sulfa drugs you are not a candidate for tamsulosin.  Continue to strain your urine.  Make sure you are drinking plenty of fluids.  Follow-up with urology as planned.  You can call them to see if they have any sooner appointments available if your symptoms are not improving.  I suspect your pain is worsening today because the stone is at the end of the ureter about to pass into the bladder.  Your LFTs are still a bit elevated today, please follow-up closely with your PCP for recheck of these.  Return for fevers, worsening pain, vomiting or other new or concerning symptoms.

## 2021-08-20 NOTE — ED Provider Notes (Signed)
[name]   CSN: [id number] Arrival date & time: 08/20/21  1243     History  Chief Complaint  Patient presents with   Flank Pain    Renee English is a 31 y.o. female.   Flank Pain      Home Medications Prior to Admission medications   Medication Sig Start Date End Date Taking? Authorizing Provider  albuterol (VENTOLIN HFA) 108 (90 Base) MCG/ACT inhaler Inhale 2 puffs into the lungs every 6 (six) hours as needed for wheezing or shortness of breath. 05/11/21   Renee Jeans, PA-C  benzonatate (TESSALON) 100 MG capsule Take 1 capsule (100 mg total) by mouth 3 (three) times daily as needed for cough. 05/11/21   Renee Jeans, PA-C  buPROPion (WELLBUTRIN XL) 150 MG 24 hr tablet Take 150 mg by mouth daily. 02/17/21   [provider]  ezetimibe (ZETIA) 10 MG tablet Take 10 mg by mouth at bedtime. 03/17/21   [provider]  fenofibrate (TRICOR) 145 MG tablet Take 145 mg by mouth daily. 02/17/21   [provider]  montelukast (SINGULAIR) 10 MG tablet Take 10 mg by mouth daily. 04/17/21   [provider]  traZODone (DESYREL) 50 MG tablet Take 50-100 mg by mouth at bedtime. 04/17/21   [provider]      Allergies    Sulfa antibiotics, Sesame oil, and Shellfish allergy    Review of Systems   Review of Systems  Genitourinary:  Positive for flank pain.   Physical Exam Updated Vital Signs BP 116/80    Pulse 86    Temp 98.1 F (36.7 C) (Oral)    Resp 16    SpO2 97%  Physical Exam  ED Results / Procedures / Treatments   Labs (all labs ordered are listed, but only abnormal results are displayed) Labs Reviewed  URINALYSIS, ROUTINE W REFLEX MICROSCOPIC - Abnormal; Notable for the following components:      Result Value   Hgb urine dipstick MODERATE (*)    RBC / HPF >50 (*)    Bacteria, UA RARE (*)    All other components within normal limits  COMPREHENSIVE METABOLIC PANEL - Abnormal;  Notable for the following components:   AST 117 (*)    ALT 180 (*)    All other components within normal limits  CBC WITH DIFFERENTIAL/PLATELET  PREGNANCY, URINE    EKG None  Radiology No results found.  Procedures Procedures    Medications Ordered in ED Medications  ondansetron (ZOFRAN) injection 4 mg (4 mg Intravenous Given 08/20/21 1520)  morphine (PF) 4 MG/ML injection 4 mg (4 mg Intravenous Given 08/20/21 1521)  ketorolac (TORADOL) 30 MG/ML injection 30 mg (30 mg Intravenous Given 08/20/21 1720)    ED Course/ Medical Decision Making/ A&P                           Patient presents to the ED with complaints of worsening right flank pain, was seen in the ED last week and diagnosed with a kidney stone.  Patient nontoxic appearing, vitals without significant abnormalities. On exam patient is tender right flank and right lower quadrant, no peritoneal signs. DDX: nephrolithiasis, pyelonephritis/UTI, cholecystitis, pancreatitis, bowel obstruction/perforation, appendicitis, dissection.  I visualized and interpreted CT scan that was completed last week which showed a stone in the right distal ureter with mild hydronephrosis.  Patient's pain worsened today,'s and she has not passed a stone yet  that she is seen, continues to have hematuria.  High clinical suspicion for worsening pain as the stone has moved down to the UVJ.  Will check labs and urinalysis but will hold off on repeat imaging at this time.  Labs overall reassuring with no leukocytosis, normal hemoglobin, normal renal function, patient does still have a mild transaminitis similar to labs from last week.  UA with hematuria as to be expected but no signs of infection.  Pain treated here in the ED and now well controlled.  Had a shared decision-making discussion with patient regarding imaging and she is in agreement on holding off on imaging at this time.  She has outpatient follow up with urology scheduled for next week.  Given  reassuring work-up and resolution of patient's pain do not feel she is a candidate for admission for further observation.  Will discharge home with  Zofran, NSAID, and Percocet with urology follow up.  Patient has a sulfa allergy and is not a candidate for Flomax.  [place] Controlled Substance reporting System queried. I discussed results, treatment plan, need for urology follow-up, and return precautions with the patient. Provided opportunity for questions, patient confirmed understanding and is in agreement with plan.          Final Clinical Impression(s) / ED Diagnoses Final diagnoses:  Right flank pain  Ureteral stone    Rx / DC Orders ED Discharge Orders          Ordered    oxyCODONE-acetaminophen (PERCOCET) 5-325 MG tablet  Every 6 hours PRN        08/20/21 1812    ibuprofen (ADVIL) 600 MG tablet  Every 6 hours PRN        08/20/21 1812    ondansetron (ZOFRAN-ODT) 4 MG disintegrating tablet        08/20/21 1812              Jacqlyn Larsen, PA-C 08/20/21 [name], MD 08/25/21 [phone]

## 2021-08-20 NOTE — ED Triage Notes (Signed)
Seen last week and diagnosed with kidney stone, states still has pain

## 2021-08-28 DIAGNOSIS — N201 Calculus of ureter: Secondary | ICD-10-CM | POA: Diagnosis not present

## 2021-09-03 ENCOUNTER — Other Ambulatory Visit: Payer: Self-pay

## 2021-09-03 DIAGNOSIS — Z01419 Encounter for gynecological examination (general) (routine) without abnormal findings: Secondary | ICD-10-CM | POA: Diagnosis not present

## 2021-09-03 DIAGNOSIS — Z113 Encounter for screening for infections with a predominantly sexual mode of transmission: Secondary | ICD-10-CM | POA: Diagnosis not present

## 2021-09-03 DIAGNOSIS — E7800 Pure hypercholesterolemia, unspecified: Secondary | ICD-10-CM | POA: Diagnosis not present

## 2021-09-03 DIAGNOSIS — Z30431 Encounter for routine checking of intrauterine contraceptive device: Secondary | ICD-10-CM | POA: Diagnosis not present

## 2021-09-03 DIAGNOSIS — N200 Calculus of kidney: Secondary | ICD-10-CM

## 2021-09-03 DIAGNOSIS — Z114 Encounter for screening for human immunodeficiency virus [HIV]: Secondary | ICD-10-CM | POA: Diagnosis not present

## 2021-09-18 ENCOUNTER — Ambulatory Visit: Payer: BC Managed Care – PPO | Admitting: Urology

## 2021-11-26 ENCOUNTER — Ambulatory Visit
Admission: EM | Admit: 2021-11-26 | Discharge: 2021-11-26 | Disposition: A | Discharge status: Home / Self Care | Payer: BC Managed Care – PPO | Attending: Nurse Practitioner | Admitting: Nurse Practitioner

## 2021-11-26 ENCOUNTER — Encounter: Payer: Self-pay | Admitting: Emergency Medicine

## 2021-11-26 DIAGNOSIS — J069 Acute upper respiratory infection, unspecified: Secondary | ICD-10-CM | POA: Diagnosis not present

## 2021-11-26 DIAGNOSIS — R051 Acute cough: Secondary | ICD-10-CM | POA: Diagnosis not present

## 2021-11-26 MED ORDER — PROMETHAZINE-DM 6.25-15 MG/5ML PO SYRP: 5.0000 mL | ORAL_SOLUTION | Freq: Four times a day (QID) | ORAL | 0 refills | Status: DC | PRN

## 2021-11-26 MED ORDER — PREDNISONE 20 MG PO TABS: 40.0000 mg | ORAL_TABLET | Freq: Every day | ORAL | 0 refills | Status: DC

## 2021-11-26 MED ORDER — FLUTICASONE PROPIONATE 50 MCG/ACT NA SUSP: 2.0000 | Freq: Every day | NASAL | 0 refills | Status: DC

## 2021-11-26 MED ORDER — ALBUTEROL SULFATE HFA 108 (90 BASE) MCG/ACT IN AERS: 2.0000 | INHALATION_SPRAY | Freq: Four times a day (QID) | RESPIRATORY_TRACT | 0 refills | Status: DC | PRN

## 2021-11-26 MED ORDER — PREDNISONE 20 MG PO TABS: 40.0000 mg | ORAL_TABLET | Freq: Every day | ORAL | 0 refills | Status: AC

## 2021-11-26 NOTE — Discharge Instructions (Addendum)
Take medication as prescribed.  Continue the amoxicillin you are currently taking.  As discussed, if the amoxicillin does not help, your symptoms were likely viral. Increase fluids and allow for plenty of rest. May take over-the-counter ibuprofen or Tylenol for pain, fever, or general discomfort. Recommend using a humidifier to help with cough and nasal congestion. Sleep elevated on 2 pillows. If symptoms worsen or do not improve after completing the medications, follow-up with your PCP.

## 2021-11-26 NOTE — ED Provider Notes (Signed)
[facility]    CSN: [id number] Arrival date & time: 11/26/21  0907      History   Chief Complaint No chief complaint on file.   HPI Renee English is a 31 y.o. female.   The patient is a 31 year old female who presents with upper respiratory symptoms.  Symptoms have been present for the past 3 days.  She complains of sore throat, cough, nasal congestion, bilateral ear fullness, and chest pain with coughing.  Patient also complains of chest tightness.  Patient states she was seen by her employee health at work, she was prescribed amoxicillin.  She has been taking that medication for the past 3 days.  She states she feels like her symptoms are worse.  She denies new onset fever, chills, headache, wheezing, or GI symptoms.  Patient states she was given a breathing treatment at work, which seem to have helped her symptoms.  Patient states she was also given a COVID test, which was negative.  The history is provided by the patient.   Past Medical History:  Diagnosis Date   BV (bacterial vaginosis) 01/10/2013   Chlamydia    Contraceptive management 10/18/2013   Postcoital bleeding 01/10/2013   Renal stones 01/05/2010   UTI (urinary tract infection)    Vaginal irritation 10/18/2013    Patient Active Problem List   Diagnosis Date Noted   Alcohol abuse 11/07/2018   MDD (major depressive disorder), single episode, severe , no psychosis ([place]) 11/07/2018   Suicidal behavior 11/06/2018   Suicide attempt ([place]) 11/06/2018   Contraceptive management 10/18/2013   Vaginal irritation 10/18/2013   BV (bacterial vaginosis) 01/10/2013   Postcoital bleeding 01/10/2013    History reviewed. No pertinent surgical history.  OB History   No obstetric history on file.      Home Medications    Prior to Admission medications   Medication Sig Start Date End Date Taking? Authorizing Provider  albuterol (VENTOLIN HFA) 108 (90 Base) MCG/ACT inhaler Inhale 2 puffs into the lungs every 6  (six) hours as needed for wheezing or shortness of breath. 11/26/21   Aideliz Garmany-Warren, Alda Lea, NP  benzonatate (TESSALON) 100 MG capsule Take 1 capsule (100 mg total) by mouth 3 (three) times daily as needed for cough. 05/11/21   Brunetta Jeans, PA-C  buPROPion (WELLBUTRIN XL) 150 MG 24 hr tablet Take 150 mg by mouth daily. 02/17/21   [provider]  ezetimibe (ZETIA) 10 MG tablet Take 10 mg by mouth at bedtime. 03/17/21   [provider]  fenofibrate (TRICOR) 145 MG tablet Take 145 mg by mouth daily. 02/17/21   [provider]  fluticasone (FLONASE) 50 MCG/ACT nasal spray Place 2 sprays into both nostrils daily. 11/26/21   Lantz Hermann-Warren, Alda Lea, NP  ibuprofen (ADVIL) 600 MG tablet Take 1 tablet (600 mg total) by mouth every 6 (six) hours as needed. 08/20/21   Jacqlyn Larsen, PA-C  montelukast (SINGULAIR) 10 MG tablet Take 10 mg by mouth daily. 04/17/21   [provider]  ondansetron (ZOFRAN-ODT) 4 MG disintegrating tablet 4mg  ODT q4 hours prn nausea/vomit 08/20/21   Jacqlyn Larsen, PA-C  oxyCODONE-acetaminophen (PERCOCET) 5-325 MG tablet Take 1 tablet by mouth every 6 (six) hours as needed. 08/20/21   Jacqlyn Larsen, PA-C  predniSONE (DELTASONE) 20 MG tablet Take 2 tablets (40 mg total) by mouth daily with breakfast for 5 days. 11/26/21 12/01/21  Latorya Bautch-Warren, Alda Lea, NP  promethazine-dextromethorphan (PROMETHAZINE-DM) 6.25-15 MG/5ML syrup Take 5 mLs by mouth  4 (four) times daily as needed for cough. 11/26/21   Burnard Enis-Warren, Alda Lea, NP  traZODone (DESYREL) 50 MG tablet Take 50-100 mg by mouth at bedtime. 04/17/21   [provider]    Family History Family History  Problem Relation Age of Onset   Diabetes Maternal Grandmother    Hypertension Maternal Grandmother    Cancer Maternal Grandmother        breast   Hypertension Maternal Grandfather    Diabetes Maternal Grandfather    Cancer Maternal Aunt        breast    Social History Social  History   Tobacco Use   Smoking status: Every Day    Packs/day: 0.25    Types: Cigarettes   Smokeless tobacco: Never  Substance Use Topics   Alcohol use: Yes    Comment: normally does not drink that much, but has drank the past 3 days    Drug use: No    Comment: pt former cocaine user      Allergies   Sulfa antibiotics, Sesame oil, and Shellfish allergy   Review of Systems Review of Systems PER HPI  Physical Exam Triage Vital Signs ED Triage Vitals  Enc Vitals Group     BP 11/26/21 0928 139/84     Pulse Rate 11/26/21 0928 91     Resp 11/26/21 0928 18     Temp 11/26/21 0928 98.6 F (37 C)     Temp Source 11/26/21 0928 Oral     SpO2 11/26/21 0928 97 %     Weight --      Height --      Head Circumference --      Peak Flow --      Pain Score 11/26/21 0930 6     Pain Loc --      Pain Edu? --      Excl. in [place]? --    No data found.  Updated Vital Signs BP 139/84 (BP Location: Right Arm)   Pulse 91   Temp 98.6 F (37 C) (Oral)   Resp 18   SpO2 97%   Visual Acuity Right Eye Distance:   Left Eye Distance:   Bilateral Distance:    Right Eye Near:   Left Eye Near:    Bilateral Near:     Physical Exam Vitals and nursing note reviewed.  Constitutional:      Appearance: Normal appearance.  HENT:     Head: Normocephalic.     Right Ear: Ear canal and external ear normal. A middle ear effusion is present.     Left Ear: Ear canal and external ear normal. A middle ear effusion is present.     Nose: Congestion present.     Right Turbinates: Enlarged and swollen.     Left Turbinates: Enlarged and swollen.     Right Sinus: No maxillary sinus tenderness or frontal sinus tenderness.     Left Sinus: No maxillary sinus tenderness or frontal sinus tenderness.     Mouth/Throat:     Pharynx: Pharyngeal swelling and posterior oropharyngeal erythema present. No uvula swelling.     Tonsils: 1+ on the right. 1+ on the left.  Cardiovascular:     Rate and Rhythm: Normal  rate and regular rhythm.     Pulses: Normal pulses.     Heart sounds: Normal heart sounds.  Abdominal:     General: Bowel sounds are normal.     Palpations: Abdomen is soft.     Tenderness: There  is no abdominal tenderness.  Musculoskeletal:     Cervical back: Normal range of motion.  Lymphadenopathy:     Cervical: No cervical adenopathy.  Skin:    General: Skin is warm and dry.     Capillary Refill: Capillary refill takes less than 2 seconds.  Neurological:     General: No focal deficit present.     Mental Status: She is oriented to person, place, and time.  Psychiatric:        Mood and Affect: Mood normal.        Behavior: Behavior normal.     UC Treatments / Results  Labs (all labs ordered are listed, but only abnormal results are displayed) Labs Reviewed - No data to display  EKG   Radiology No results found.  Procedures Procedures (including critical care time)  Medications Ordered in UC Medications - No data to display  Initial Impression / Assessment and Plan / UC Course  I have reviewed the triage vital signs and the nursing notes.  Pertinent labs & imaging results that were available during my care of the patient were reviewed by me and considered in my medical decision making (see chart for details).  [place] with upper respiratory symptoms that been present for the past 3 days.  Patient presents today complaining of worsening symptoms.  She complains of worsening sore throat, cough, and nasal congestion.  Patient was treated with amoxicillin through her employee health.  Patient also complains of wheezing.  Patient has had a negative COVID test.  Otherwise vital signs reassuring.  No point-of-care test was done today, as patient is currently on amoxicillin.  Will provide patient additional symptomatic treatment to include an albuterol inhaler, Flonase, Promethazine DM, and prednisone.  Patient was advised that if her symptoms do not improve while she is on the  amoxicillin, symptoms are most likely of viral etiology.  Supportive care was recommended, may use over-the-counter pain medications as needed.  Follow-up as needed.  Work note was provided. Final Clinical Impressions(s) / UC Diagnoses   Final diagnoses:  Acute upper respiratory infection  Acute cough     Discharge Instructions      Take medication as prescribed.  Continue the amoxicillin you are currently taking.  As discussed, if the amoxicillin does not help, your symptoms were likely viral. Increase fluids and allow for plenty of rest. May take over-the-counter ibuprofen or Tylenol for pain, fever, or general discomfort. Recommend using a humidifier to help with cough and nasal congestion. Sleep elevated on 2 pillows. If symptoms worsen or do not improve after completing the medications, follow-up with your PCP.      ED Prescriptions     Medication Sig Dispense Auth. Provider   albuterol (VENTOLIN HFA) 108 (90 Base) MCG/ACT inhaler  (Status: Discontinued) Inhale 2 puffs into the lungs every 6 (six) hours as needed for wheezing or shortness of breath. 8 g Freddrick Gladson-Warren, Alda Lea, NP   promethazine-dextromethorphan (PROMETHAZINE-DM) 6.25-15 MG/5ML syrup  (Status: Discontinued) Take 5 mLs by mouth 4 (four) times daily as needed for cough. 140 mL Estefania Kamiya-Warren, Alda Lea, NP   predniSONE (DELTASONE) 20 MG tablet  (Status: Discontinued) Take 2 tablets (40 mg total) by mouth daily with breakfast for 5 days. 10 tablet Cono Gebhard-Warren, Alda Lea, NP   fluticasone (FLONASE) 50 MCG/ACT nasal spray  (Status: Discontinued) Place 2 sprays into both nostrils daily. 16 g Mariel Lukins-Warren, Alda Lea, NP   albuterol (VENTOLIN HFA) 108 (90 Base) MCG/ACT inhaler Inhale 2 puffs into the  lungs every 6 (six) hours as needed for wheezing or shortness of breath. 8 g Zayla Agar-Warren, Alda Lea, NP   fluticasone (FLONASE) 50 MCG/ACT nasal spray Place 2 sprays into both nostrils daily. 16 g Charae Depaolis-Warren, Alda Lea, NP   predniSONE (DELTASONE) 20 MG tablet Take 2 tablets (40 mg total) by mouth daily with breakfast for 5 days. 10 tablet Nakema Fake-Warren, Alda Lea, NP   promethazine-dextromethorphan (PROMETHAZINE-DM) 6.25-15 MG/5ML syrup Take 5 mLs by mouth 4 (four) times daily as needed for cough. 140 mL Catelin Manthe-Warren, Alda Lea, NP      PDMP not reviewed this encounter.   Tish Men, NP 11/26/21 1157

## 2021-11-26 NOTE — ED Triage Notes (Signed)
Currently on amoxicillin for ear pain and sore throat that started on Monday.  States back and chest ache.  States throat is still sore.

## 2022-02-23 DIAGNOSIS — F419 Anxiety disorder, unspecified: Secondary | ICD-10-CM | POA: Diagnosis not present

## 2022-02-23 DIAGNOSIS — R5383 Other fatigue: Secondary | ICD-10-CM | POA: Diagnosis not present

## 2022-02-23 DIAGNOSIS — Z6834 Body mass index (BMI) 34.0-34.9, adult: Secondary | ICD-10-CM | POA: Diagnosis not present

## 2022-02-23 DIAGNOSIS — R7309 Other abnormal glucose: Secondary | ICD-10-CM | POA: Diagnosis not present

## 2022-02-23 DIAGNOSIS — E8889 Other specified metabolic disorders: Secondary | ICD-10-CM | POA: Diagnosis not present

## 2022-02-23 DIAGNOSIS — N200 Calculus of kidney: Secondary | ICD-10-CM | POA: Diagnosis not present

## 2022-02-23 DIAGNOSIS — E782 Mixed hyperlipidemia: Secondary | ICD-10-CM | POA: Diagnosis not present

## 2022-02-23 DIAGNOSIS — E669 Obesity, unspecified: Secondary | ICD-10-CM | POA: Diagnosis not present

## 2022-02-24 ENCOUNTER — Telehealth: Payer: BC Managed Care – PPO | Admitting: Physician Assistant

## 2022-02-24 DIAGNOSIS — J069 Acute upper respiratory infection, unspecified: Secondary | ICD-10-CM | POA: Diagnosis not present

## 2022-02-24 DIAGNOSIS — H66005 Acute suppurative otitis media without spontaneous rupture of ear drum, recurrent, left ear: Secondary | ICD-10-CM

## 2022-02-24 MED ORDER — CIPROFLOXACIN-DEXAMETHASONE 0.3-0.1 % OT SUSP: 4.0000 [drp] | Freq: Two times a day (BID) | OTIC | 0 refills | Status: DC

## 2022-02-24 MED ORDER — PSEUDOEPH-BROMPHEN-DM 30-2-10 MG/5ML PO SYRP: 5.0000 mL | ORAL_SOLUTION | Freq: Four times a day (QID) | ORAL | 0 refills | Status: DC | PRN

## 2022-02-24 NOTE — Patient Instructions (Signed)
Renee English, thank you for joining Margaretann Loveless, PA-C for today's virtual visit.  While this provider is not your primary care provider (PCP), if your PCP is located in our provider database this encounter information will be shared with them immediately following your visit.  Consent: (Patient) Renee English provided verbal consent for this virtual visit at the beginning of the encounter.  Current Medications:  Current Outpatient Medications:    brompheniramine-pseudoephedrine-DM 30-2-10 MG/5ML syrup, Take 5 mLs by mouth 4 (four) times daily as needed., Disp: 120 mL, Rfl: 0   ciprofloxacin-dexamethasone (CIPRODEX) OTIC suspension, Place 4 drops into the left ear 2 (two) times daily., Disp: 7.5 mL, Rfl: 0   albuterol (VENTOLIN HFA) 108 (90 Base) MCG/ACT inhaler, Inhale 2 puffs into the lungs every 6 (six) hours as needed for wheezing or shortness of breath., Disp: 8 g, Rfl: 0   benzonatate (TESSALON) 100 MG capsule, Take 1 capsule (100 mg total) by mouth 3 (three) times daily as needed for cough., Disp: 30 capsule, Rfl: 0   buPROPion (WELLBUTRIN XL) 150 MG 24 hr tablet, Take 150 mg by mouth daily., Disp: , Rfl:    ezetimibe (ZETIA) 10 MG tablet, Take 10 mg by mouth at bedtime., Disp: , Rfl:    fenofibrate (TRICOR) 145 MG tablet, Take 145 mg by mouth daily., Disp: , Rfl:    fluticasone (FLONASE) 50 MCG/ACT nasal spray, Place 2 sprays into both nostrils daily., Disp: 16 g, Rfl: 0   ibuprofen (ADVIL) 600 MG tablet, Take 1 tablet (600 mg total) by mouth every 6 (six) hours as needed., Disp: 30 tablet, Rfl: 0   montelukast (SINGULAIR) 10 MG tablet, Take 10 mg by mouth daily., Disp: , Rfl:    ondansetron (ZOFRAN-ODT) 4 MG disintegrating tablet, 4mg  ODT q4 hours prn nausea/vomit, Disp: 10 tablet, Rfl: 0   oxyCODONE-acetaminophen (PERCOCET) 5-325 MG tablet, Take 1 tablet by mouth every 6 (six) hours as needed., Disp: 8 tablet, Rfl: 0   traZODone (DESYREL) 50 MG tablet, Take 50-100 mg by  mouth at bedtime., Disp: , Rfl:    Medications ordered in this encounter:  Meds ordered this encounter  Medications   ciprofloxacin-dexamethasone (CIPRODEX) OTIC suspension    Sig: Place 4 drops into the left ear 2 (two) times daily.    Dispense:  7.5 mL    Refill:  0    Order Specific Question:   Supervising Provider    Answer:   , BRIAN [[id number]]   brompheniramine-pseudoephedrine-DM 30-2-10 MG/5ML syrup    Sig: Take 5 mLs by mouth 4 (four) times daily as needed.    Dispense:  120 mL    Refill:  0    Order Specific Question:   Supervising Provider    Answer:   07-01-1973, BRIAN [[id number]]     *If you need refills on other medications prior to your next appointment, please contact your pharmacy*  Follow-Up: Call back or seek an in-person evaluation if the symptoms worsen or if the condition fails to improve as anticipated.  Other Instructions Earache, Adult An earache, or ear pain, can be caused by many things, including: An infection. Ear wax buildup. Ear pressure. Something in the ear that should not be there (foreign body). A sore throat. Tooth problems. Jaw problems. Treatment of the earache will depend on the cause. If the cause is not clear or cannot be determined, you may need to watch your symptoms until your earache goes away or until a cause is found. Follow  these instructions at home: Medicines Take or apply over-the-counter and prescription medicines only as told by your health care provider. If you were prescribed an antibiotic medicine, use it as told by your health care provider. Do not stop using the antibiotic even if you start to feel better. Do not put anything in your ear other than medicine that is prescribed by your health care provider. Managing pain If directed, apply heat to the affected area as often as told by your health care provider. Use the heat source that your health care provider recommends, such as a moist heat pack or a heating pad. Place a  towel between your skin and the heat source. Leave the heat on for 20-30 minutes. Remove the heat if your skin turns bright red. This is especially important if you are unable to feel pain, heat, or cold. You may have a greater risk of getting burned. If directed, put ice on the affected area as often as told by your health care provider. To do this:     Put ice in a plastic bag. Place a towel between your skin and the bag. Leave the ice on for 20 minutes, 2-3 times a day. General instructions Pay attention to any changes in your symptoms. Try resting in an upright position instead of lying down. This may help to reduce pressure in your ear and relieve pain. Chew gum if it helps to relieve your ear pain. Treat any allergies as told by your health care provider. Drink enough fluid to keep your urine pale yellow. It is up to you to get the results of any tests that were done. Ask your health care provider, or the department that is doing the tests, when your results will be ready. Keep all follow-up visits as told by your health care provider. This is important. Contact a health care provider if: Your pain does not improve within 2 days. Your earache gets worse. You have new symptoms. You have a fever. Get help right away if you: Have a severe headache. Have a stiff neck. Have trouble swallowing. Have redness or swelling behind your ear. Have fluid or blood coming from your ear. Have hearing loss. Feel dizzy. Summary An earache, or ear pain, can be caused by many things. Treatment of the earache will depend on the cause. Follow recommendations from your health care provider to treat your ear pain. If the cause is not clear or cannot be determined, you may need to watch your symptoms until your earache goes away or until a cause is found. Keep all follow-up visits as told by your health care provider. This is important. This information is not intended to replace advice given to you  by your health care provider. Make sure you discuss any questions you have with your health care provider. Document Revised: 01/26/2019 Document Reviewed: 01/27/2019 Elsevier Patient Education  2023 Elsevier Inc.    If you have been instructed to have an in-person evaluation today at a local Urgent Care facility, please use the link below. It will take you to a list of all of our available [facility], including address, phone number and hours of operation. Please do not delay care.  [facility]  If you or a family member do not have a primary care provider, use the link below to schedule a visit and establish care. When you choose a [facility] primary care physician or advanced practice provider, you gain a long-term partner in health.  Find a Primary Care Provider  Learn more about [facility]'s in-office and virtual care options: [facility]

## 2022-02-24 NOTE — Progress Notes (Signed)
Virtual Visit Consent   Renee English, you are scheduled for a virtual visit with a [facility] provider today. Just as with appointments in the office, your consent must be obtained to participate. Your consent will be active for this visit and any virtual visit you may have with one of our providers in the next 365 days. If you have a MyChart account, a copy of this consent can be sent to you electronically.  As this is a virtual visit, video technology does not allow for your provider to perform a traditional examination. This may limit your provider's ability to fully assess your condition. If your provider identifies any concerns that need to be evaluated in person or the need to arrange testing (such as labs, EKG, etc.), we will make arrangements to do so. Although advances in technology are sophisticated, we cannot ensure that it will always work on either your end or our end. If the connection with a video visit is poor, the visit may have to be switched to a telephone visit. With either a video or telephone visit, we are not always able to ensure that we have a secure connection.  By engaging in this virtual visit, you consent to the provision of healthcare and authorize for your insurance to be billed (if applicable) for the services provided during this visit. Depending on your insurance coverage, you may receive a charge related to this service.  I need to obtain your verbal consent now. Are you willing to proceed with your visit today? Renee English has provided verbal consent on 02/24/2022 for a virtual visit (video or telephone). Margaretann Loveless, PA-C  Date: 02/24/2022 10:54 AM  Virtual Visit via Video Note   I, Margaretann Loveless, connected with  Renee English  ([id number], 01-17-1991) on 02/24/22 at 11:15 AM EDT by a video-enabled telemedicine application and verified that I am speaking with the correct person using two identifiers.  Location: Patient: Virtual Visit  Location Patient: Mobile Provider: Virtual Visit Location Provider: Home Office   I discussed the limitations of evaluation and management by telemedicine and the availability of in person appointments. The patient expressed understanding and agreed to proceed.    History of Present Illness: Renee English is a 31 y.o. who identifies as a female who was assigned female at birth, and is being seen today for otalgia and body aches.  HPI: Otalgia  There is pain in the left ear. This is a new problem. The current episode started in the past 7 days (Sunday afternoon into Monday morning; after returning from vacation). The problem occurs constantly. The problem has been gradually worsening. There has been no fever. The pain is moderate. Associated symptoms include coughing, diarrhea (2 days), ear discharge (yellow), headaches (left side of face), rhinorrhea and a sore throat. Pertinent negatives include no hearing loss, neck pain or vomiting. Associated symptoms comments: Congestion, myalgias. She has tried NSAIDs (ear drops, ibuprofen) for the symptoms. The treatment provided no relief.     Problems:  Patient Active Problem List   Diagnosis Date Noted   Alcohol abuse 11/07/2018   MDD (major depressive disorder), single episode, severe , no psychosis ([facility]) 11/07/2018   Suicidal behavior 11/06/2018   Suicide attempt ([facility]) 11/06/2018   Contraceptive management 10/18/2013   Vaginal irritation 10/18/2013   BV (bacterial vaginosis) 01/10/2013   Postcoital bleeding 01/10/2013    Allergies:  Allergies  Allergen Reactions   Sulfa Antibiotics Anaphylaxis, Hives and Swelling   Sesame  Oil Hives and Swelling   Shellfish Allergy Hives and Swelling   Medications:  Current Outpatient Medications:    brompheniramine-pseudoephedrine-DM 30-2-10 MG/5ML syrup, Take 5 mLs by mouth 4 (four) times daily as needed., Disp: 120 mL, Rfl: 0   ciprofloxacin-dexamethasone (CIPRODEX) OTIC suspension, Place 4 drops  into the left ear 2 (two) times daily., Disp: 7.5 mL, Rfl: 0   albuterol (VENTOLIN HFA) 108 (90 Base) MCG/ACT inhaler, Inhale 2 puffs into the lungs every 6 (six) hours as needed for wheezing or shortness of breath., Disp: 8 g, Rfl: 0   benzonatate (TESSALON) 100 MG capsule, Take 1 capsule (100 mg total) by mouth 3 (three) times daily as needed for cough., Disp: 30 capsule, Rfl: 0   buPROPion (WELLBUTRIN XL) 150 MG 24 hr tablet, Take 150 mg by mouth daily., Disp: , Rfl:    ezetimibe (ZETIA) 10 MG tablet, Take 10 mg by mouth at bedtime., Disp: , Rfl:    fenofibrate (TRICOR) 145 MG tablet, Take 145 mg by mouth daily., Disp: , Rfl:    fluticasone (FLONASE) 50 MCG/ACT nasal spray, Place 2 sprays into both nostrils daily., Disp: 16 g, Rfl: 0   ibuprofen (ADVIL) 600 MG tablet, Take 1 tablet (600 mg total) by mouth every 6 (six) hours as needed., Disp: 30 tablet, Rfl: 0   montelukast (SINGULAIR) 10 MG tablet, Take 10 mg by mouth daily., Disp: , Rfl:    ondansetron (ZOFRAN-ODT) 4 MG disintegrating tablet, 4mg  ODT q4 hours prn nausea/vomit, Disp: 10 tablet, Rfl: 0   oxyCODONE-acetaminophen (PERCOCET) 5-325 MG tablet, Take 1 tablet by mouth every 6 (six) hours as needed., Disp: 8 tablet, Rfl: 0   traZODone (DESYREL) 50 MG tablet, Take 50-100 mg by mouth at bedtime., Disp: , Rfl:   Observations/Objective: Patient is well-developed, well-nourished in no acute distress.  Resting comfortably  Head is normocephalic, atraumatic.  No labored breathing.  Speech is clear and coherent with logical content.  Patient is alert and oriented at baseline.    Assessment and Plan: 1. Recurrent acute suppurative otitis media without spontaneous rupture of left tympanic membrane - ciprofloxacin-dexamethasone (CIPRODEX) OTIC suspension; Place 4 drops into the left ear 2 (two) times daily.  Dispense: 7.5 mL; Refill: 0  2. Viral URI with cough - brompheniramine-pseudoephedrine-DM 30-2-10 MG/5ML syrup; Take 5 mLs by mouth  4 (four) times daily as needed.  Dispense: 120 mL; Refill: 0  - Worsening symptoms that have not responded to OTC medications.  - Advised to take at home Covid 19 test - Will give Ciprodex ear drops - Bromfed DM for cough - Continue allergy medications.  - Steam and humidifier can help - Stay well hydrated and get plenty of rest.  - Seek in person evaluation if no symptom improvement or if symptoms worsen   Follow Up Instructions: I discussed the assessment and treatment plan with the patient. The patient was provided an opportunity to ask questions and all were answered. The patient agreed with the plan and demonstrated an understanding of the instructions.  A copy of instructions were sent to the patient via MyChart unless otherwise noted below.    The patient was advised to call back or seek an in-person evaluation if the symptoms worsen or if the condition fails to improve as anticipated.  Time:  I spent 10 minutes with the patient via telehealth technology discussing the above problems/concerns.    07-01-1973, PA-C

## 2022-02-27 ENCOUNTER — Other Ambulatory Visit: Payer: Self-pay

## 2022-02-27 ENCOUNTER — Emergency Department (HOSPITAL_COMMUNITY): Payer: BC Managed Care – PPO

## 2022-02-27 ENCOUNTER — Encounter (HOSPITAL_COMMUNITY): Payer: Self-pay | Admitting: Emergency Medicine

## 2022-02-27 DIAGNOSIS — S93324A Dislocation of tarsometatarsal joint of right foot, initial encounter: Secondary | ICD-10-CM | POA: Diagnosis not present

## 2022-02-27 DIAGNOSIS — S92901A Unspecified fracture of right foot, initial encounter for closed fracture: Secondary | ICD-10-CM | POA: Insufficient documentation

## 2022-02-27 DIAGNOSIS — M7989 Other specified soft tissue disorders: Secondary | ICD-10-CM | POA: Diagnosis not present

## 2022-02-27 DIAGNOSIS — W1692XA Jumping or diving into unspecified water causing other injury, initial encounter: Secondary | ICD-10-CM | POA: Diagnosis not present

## 2022-02-27 DIAGNOSIS — Y9339 Activity, other involving climbing, rappelling and jumping off: Secondary | ICD-10-CM | POA: Diagnosis not present

## 2022-02-27 DIAGNOSIS — M79671 Pain in right foot: Secondary | ICD-10-CM | POA: Diagnosis not present

## 2022-02-27 DIAGNOSIS — S99921A Unspecified injury of right foot, initial encounter: Secondary | ICD-10-CM | POA: Diagnosis not present

## 2022-02-27 NOTE — ED Triage Notes (Signed)
Pt to the ED with complaints of right foot pain after jumping off a diving board.  Pt has obvious swelling to the top of her right foot.

## 2022-02-28 ENCOUNTER — Emergency Department (HOSPITAL_COMMUNITY)
Admission: EM | Admit: 2022-02-28 | Discharge: 2022-02-28 | Disposition: A | Discharge status: Home / Self Care | Payer: BC Managed Care – PPO | Attending: Emergency Medicine | Admitting: Emergency Medicine

## 2022-02-28 DIAGNOSIS — S92901A Unspecified fracture of right foot, initial encounter for closed fracture: Secondary | ICD-10-CM

## 2022-02-28 DIAGNOSIS — S93324A Dislocation of tarsometatarsal joint of right foot, initial encounter: Secondary | ICD-10-CM

## 2022-02-28 MED ORDER — OXYCODONE-ACETAMINOPHEN 5-325 MG PO TABS: 2.0000 | ORAL_TABLET | ORAL | 0 refills | Status: DC | PRN

## 2022-02-28 MED ORDER — OXYCODONE-ACETAMINOPHEN 5-325 MG PO TABS: 1.0000 | ORAL_TABLET | Freq: Four times a day (QID) | ORAL | 0 refills | Status: DC | PRN

## 2022-02-28 MED ORDER — HYDROMORPHONE HCL 1 MG/ML IJ SOLN
1.0000 mg | Freq: Once | INTRAMUSCULAR | Status: AC
  Administered 2022-02-28: 1 mg via INTRAMUSCULAR
  Filled 2022-02-28: qty 1

## 2022-02-28 MED ORDER — ACETAMINOPHEN 500 MG PO TABS
1000.0000 mg | ORAL_TABLET | Freq: Once | ORAL | Status: AC
Start: 2022-02-28 — End: 2022-02-28
  Administered 2022-02-28: 1000 mg via ORAL
  Filled 2022-02-28: qty 2

## 2022-02-28 NOTE — ED Provider Notes (Signed)
[facility] EMERGENCY DEPARTMENT Provider Note   CSN: [id number] Arrival date & time: 02/27/22  1833     History  Chief Complaint  Patient presents with   Foot Pain    Renee English is a 31 y.o. female.  Sudden onset of foot pain when jumping off of a diving board (right foot). Swelling, difficulty bearing weight. No other injuries.    Foot Pain       Home Medications Prior to Admission medications   Medication Sig Start Date End Date Taking? Authorizing Provider  oxyCODONE-acetaminophen (PERCOCET) 5-325 MG tablet Take 2 tablets by mouth every 4 (four) hours as needed. 02/28/22  Yes Alistair Senft, Barbara Cower, MD  oxyCODONE-acetaminophen (PERCOCET/ROXICET) 5-325 MG tablet Take 1 tablet by mouth every 6 (six) hours as needed for severe pain. 02/28/22  Yes Jarrick Fjeld, Barbara Cower, MD  albuterol (VENTOLIN HFA) 108 (90 Base) MCG/ACT inhaler Inhale 2 puffs into the lungs every 6 (six) hours as needed for wheezing or shortness of breath. 11/26/21   Leath-Warren, Sadie Haber, NP  benzonatate (TESSALON) 100 MG capsule Take 1 capsule (100 mg total) by mouth 3 (three) times daily as needed for cough. 05/11/21   Waldon Merl, PA-C  brompheniramine-pseudoephedrine-DM 30-2-10 MG/5ML syrup Take 5 mLs by mouth 4 (four) times daily as needed. 02/24/22   Margaretann Loveless, PA-C  buPROPion (WELLBUTRIN XL) 150 MG 24 hr tablet Take 150 mg by mouth daily. 02/17/21   [provider]  ciprofloxacin-dexamethasone (CIPRODEX) OTIC suspension Place 4 drops into the left ear 2 (two) times daily. 02/24/22   Margaretann Loveless, PA-C  ezetimibe (ZETIA) 10 MG tablet Take 10 mg by mouth at bedtime. 03/17/21   [provider]  fenofibrate (TRICOR) 145 MG tablet Take 145 mg by mouth daily. 02/17/21   [provider]  fluticasone (FLONASE) 50 MCG/ACT nasal spray Place 2 sprays into both nostrils daily. 11/26/21   Leath-Warren, Sadie Haber, NP  ibuprofen (ADVIL) 600 MG tablet Take 1 tablet (600 mg total) by  mouth every 6 (six) hours as needed. 08/20/21   Dartha Lodge, PA-C  montelukast (SINGULAIR) 10 MG tablet Take 10 mg by mouth daily. 04/17/21   [provider]  ondansetron (ZOFRAN-ODT) 4 MG disintegrating tablet 4mg  ODT q4 hours prn nausea/vomit 08/20/21   08/22/21, PA-C  traZODone (DESYREL) 50 MG tablet Take 50-100 mg by mouth at bedtime. 04/17/21   [provider]      Allergies    Sulfa antibiotics, Sesame oil, and Shellfish allergy    Review of Systems   Review of Systems  Physical Exam Updated Vital Signs BP 117/79   Pulse 96   Temp 98.4 F (36.9 C) (Oral)   Resp 15   Ht 5\' 2"  (1.575 m)   Wt 80.5 kg   SpO2 95%   BMI 32.46 kg/m  Physical Exam Vitals and nursing note reviewed.  Constitutional:      Appearance: She is well-developed.  HENT:     Head: Normocephalic and atraumatic.     Mouth/Throat:     Mouth: Mucous membranes are moist.     Pharynx: Oropharynx is clear.  Eyes:     Pupils: Pupils are equal, round, and reactive to light.  Cardiovascular:     Rate and Rhythm: Normal rate and regular rhythm.  Pulmonary:     Effort: No respiratory distress.     Breath sounds: No stridor.  Abdominal:     General: Abdomen is flat. There is no distension.  Musculoskeletal:        General: Swelling and tenderness (right dorsal foot) present.     Cervical back: Normal range of motion.  Skin:    General: Skin is warm and dry.     Findings: Bruising (right dorsal foot) present.  Neurological:     Mental Status: She is alert.     ED Results / Procedures / Treatments   Labs (all labs ordered are listed, but only abnormal results are displayed) Labs Reviewed - No data to display  EKG None  Radiology CT Foot Right Wo Contrast  Result Date: 02/28/2022 CLINICAL DATA:  Right foot pain, diving board injury, occult fracture suspected EXAM: CT OF THE RIGHT FOOT WITHOUT CONTRAST TECHNIQUE: Multidetector CT imaging of the right foot was performed  according to the standard protocol. Multiplanar CT image reconstructions were also generated. RADIATION DOSE REDUCTION: This exam was performed according to the departmental dose-optimization program which includes automated exposure control, adjustment of the mA and/or kV according to patient size and/or use of iterative reconstruction technique. COMPARISON:  Right foot radiograph dated 02/27/2022 FINDINGS: Comminuted fracture involving the medial cuneiform. This includes a ligamentous avulsion along the lateral aspect of the distal bone, at the insertion of the Lisfranc ligament (series 4/image 62). Mild associated disruption along the lateral base of the 1st metatarsal (series 4/image 59). The base of the 2nd metatarsal is intact. However, there is widening of the 1st/2nd interspace. Subtle nondisplaced fracture involving the medial base of the lateral cuneiform (series 4/image 37). Moderate soft tissue swelling along the dorsal aspect of the forefoot. IMPRESSION: Comminuted fracture involving the medial cuneiform. Additional fractures involving the lateral cuneiform and possibly the base of the 1st metatarsal. Associated disruption of the Lisfranc ligament with ligamentous avulsion, as described above. Orthopedic consultation is suggested. Electronically Signed   By: Charline Bills M.D.   On: 02/28/2022 00:05   DG Foot Complete Right  Result Date: 02/27/2022 CLINICAL DATA:  Right foot pain following diving, initial encounter EXAM: RIGHT FOOT COMPLETE - 3+ VIEW COMPARISON:  None Available. FINDINGS: Soft tissue swelling is noted over the metatarsals. Cortical irregularity is noted the base of the second metatarsal at its articulation with the second cuneiform. These findings are suspicious for a fracture and possible Lisfranc injury no other focal abnormality is noted. IMPRESSION: Irregularity at the base of the second metatarsal highly suspicious for fracture and possible Lisfranc injury. Correlation to  point tenderness is recommended. CT may be helpful for further evaluation. Electronically Signed   By: Alcide Clever M.D.   On: 02/27/2022 19:20    Procedures Procedures    Medications Ordered in ED Medications  HYDROmorphone (DILAUDID) injection 1 mg (1 mg Intramuscular Given 02/28/22 0217)  acetaminophen (TYLENOL) tablet 1,000 mg (1,000 mg Oral Given 02/28/22 0216)    ED Course/ Medical Decision Making/ A&P                           Medical Decision Making Amount and/or Complexity of Data Reviewed Radiology: ordered.  Risk OTC drugs. Prescription drug management.  XR done and showed questionable fracture but also likely lisfranc injury (independently viewed and interpreted by myself and radiology read reviewed).  CT foot done and showed cuneiform fractures and widening c/w lisfranc (independently viewed and interpreted by myself and radiology read reviewed).  Cam walker/crutches/pain meds provided. NVI. FU w/ orthopedics for definitive treatment.   Final Clinical Impression(s) / ED Diagnoses Final diagnoses:  Closed  fracture of right foot, initial encounter  Dislocation of tarsometatarsal joint of right foot, initial encounter    Rx / DC Orders ED Discharge Orders          Ordered    oxyCODONE-acetaminophen (PERCOCET/ROXICET) 5-325 MG tablet  Every 6 hours PRN        02/28/22 0136    oxyCODONE-acetaminophen (PERCOCET) 5-325 MG tablet  Every 4 hours PRN        02/28/22 0137              Suleyma Wafer, Barbara Cower, MD 02/28/22 [phone]

## 2022-02-28 NOTE — Discharge Instructions (Addendum)
We have provided you with a boot and crutches. These are not definitive treatment, you need to follow up with orthopedic surgeon in the next week or so for consultation and appropriate treatment.   You can remove the boot for comfort, bathing, etc. Otherwise use crutches and stay nonweightbearing until you see the orthopedic surgeon. His contact information is listed in the paperwork.

## 2022-03-01 ENCOUNTER — Other Ambulatory Visit: Payer: Self-pay

## 2022-03-01 ENCOUNTER — Telehealth: Payer: Self-pay

## 2022-03-01 DIAGNOSIS — S92901A Unspecified fracture of right foot, initial encounter for closed fracture: Secondary | ICD-10-CM

## 2022-03-01 MED FILL — Oxycodone w/ Acetaminophen Tab 5-325 MG: ORAL | Qty: 6 | Status: AC

## 2022-03-01 NOTE — Telephone Encounter (Signed)
-----   Message from Oliver Barre, MD sent at 03/01/2022 10:17 AM EDT ----- Renee English  Below is the message I sent about this patient.  Please let her know the plan.   Thanks Loraine Leriche    ----- Message ----- From: Oliver Barre, MD Sent: 02/28/2022  11:36 AM EDT To: Toniann Fail May, RT; Baird Kay, LPN  Good morning  Patient was seen in the ED over the weekend.  Please refer her to Dr. Lajoyce Corners for definitive treatment of a right foot injury.   Thanks [employer]

## 2022-03-01 NOTE — Telephone Encounter (Signed)
Called back to patient to notify (she had returned Abby's call) - I called her back to notify; patient voiced understanding and will call Aldean Baker to scheduled with Dr Lajoyce Corners.

## 2022-03-01 NOTE — Telephone Encounter (Signed)
Per Dr.Cairns, patient needs to see Dr.Duda in the [place] office. Called patient to advise of need for referral to [facility]. No answer. Left generic message asking for a call back.

## 2022-03-02 ENCOUNTER — Ambulatory Visit: Payer: BC Managed Care – PPO | Admitting: Orthopedic Surgery

## 2022-03-02 ENCOUNTER — Other Ambulatory Visit: Payer: Self-pay

## 2022-03-02 ENCOUNTER — Encounter: Payer: Self-pay | Admitting: Orthopedic Surgery

## 2022-03-02 ENCOUNTER — Encounter (HOSPITAL_COMMUNITY): Payer: Self-pay | Admitting: Orthopedic Surgery

## 2022-03-02 DIAGNOSIS — S93324A Dislocation of tarsometatarsal joint of right foot, initial encounter: Secondary | ICD-10-CM | POA: Diagnosis not present

## 2022-03-02 NOTE — Progress Notes (Signed)
Office Visit Note   Patient: Renee English           Date of Birth: 1990-07-26           MRN: [id number] Visit Date: 03/02/2022              Requested by: No referring provider defined for this encounter. PCP: Patient, No Pcp Per  Chief Complaint  Patient presents with   Right Foot - Injury    Lisfranc dislocation      HPI: Patient is a 31 year old woman who is seen for initial evaluation for Lisfranc fracture dislocation right foot.  Patient states that she was using a diving board when her foot bent backwards.  Patient's father is with her today.  Assessment & Plan: Visit Diagnoses:  1. Lisfranc dislocation, right, initial encounter     Plan: With the unstable Lisfranc fracture dislocation involving the base of the first and second metatarsal.  I have recommended proceeding with fusion across the Lisfranc complex.  Risk and benefits were discussed including infection neurovascular injury persistent pain need for additional surgery.  Patient states she understands wished to proceed at this time.  Follow-Up Instructions: Return in about 1 week (around 03/09/2022).   Ortho Exam  Patient is alert, oriented, no adenopathy, well-dressed, normal affect, normal respiratory effort. Examination patient has a palpable dorsalis pedis pulse she has ecchymosis and bruising into the toes of her foot.  There is swelling there is no blisters.  She has a palpable dorsalis pedis pulse.  Review of the CT scan shows comminution through the medial cuneiform and fractures across the base of the second metatarsal Lisfranc complex.  Imaging: No results found. No images are attached to the encounter.  Labs: Lab Results  Component Value Date   REPTSTATUS 10/15/2018 FINAL 10/14/2018   CULT  10/14/2018    NO GROWTH Performed at [facility], [address] [place], [place] [address]    LABORGA NO GROWTH 01/10/2013     Lab Results  Component Value Date   ALBUMIN 4.6 08/20/2021    ALBUMIN 4.6 08/13/2021   ALBUMIN 4.7 11/05/2018    No results found for: "MG" No results found for: "VD25OH"  No results found for: "PREALBUMIN"    Latest Ref Rng & Units 08/20/2021    2:45 PM 08/13/2021    5:38 PM 11/05/2018   11:36 PM  CBC EXTENDED  WBC 4.0 - 10.5 K/uL 7.4  10.2  7.8   RBC 3.87 - 5.11 MIL/uL 4.48  4.48  4.91   Hemoglobin 12.0 - 15.0 g/dL 02.5  85.2  77.8   HCT 36.0 - 46.0 % 42.3  42.4  45.5   Platelets 150 - 400 K/uL 284  320  375   NEUT# 1.7 - 7.7 K/uL 5.1   5.0   Lymph# 0.7 - 4.0 K/uL 1.7   2.2      There is no height or weight on file to calculate BMI.  Orders:  No orders of the defined types were placed in this encounter.  No orders of the defined types were placed in this encounter.    Procedures: No procedures performed  Clinical Data: No additional findings.  ROS:  All other systems negative, except as noted in the HPI. Review of Systems  Objective: Vital Signs: There were no vitals taken for this visit.  Specialty Comments:  No specialty comments available.  PMFS History: Patient Active Problem List   Diagnosis Date Noted  Alcohol abuse 11/07/2018   MDD (major depressive disorder), single episode, severe , no psychosis (HCC) 11/07/2018   Suicidal behavior 11/06/2018   Suicide attempt (HCC) 11/06/2018   Contraceptive management 10/18/2013   Vaginal irritation 10/18/2013   BV (bacterial vaginosis) 01/10/2013   Postcoital bleeding 01/10/2013   Past Medical History:  Diagnosis Date   BV (bacterial vaginosis) 01/10/2013   Chlamydia    Contraceptive management 10/18/2013   Postcoital bleeding 01/10/2013   Renal stones 01/05/2010   UTI (urinary tract infection)    Vaginal irritation 10/18/2013    Family History  Problem Relation Age of Onset   Diabetes Maternal Grandmother    Hypertension Maternal Grandmother    Cancer Maternal Grandmother        breast   Hypertension Maternal Grandfather    Diabetes Maternal Grandfather     Cancer Maternal Aunt        breast    History reviewed. No pertinent surgical history. Social History   Occupational History   Not on file  Tobacco Use   Smoking status: Every Day    Packs/day: 0.25    Types: Cigarettes   Smokeless tobacco: Never  Vaping Use   Vaping Use: Never used  Substance and Sexual Activity   Alcohol use: Yes    Comment: normally does not drink that much, but has drank the past 3 days    Drug use: No    Comment: pt former cocaine user    Sexual activity: Not Currently    Birth control/protection: Inserts

## 2022-03-02 NOTE — Anesthesia Preprocedure Evaluation (Signed)
Anesthesia Evaluation  Patient identified by MRN, date of birth, ID band Patient awake    Reviewed: Allergy & Precautions, NPO status , Patient's Chart, lab work & pertinent test results  Airway Mallampati: II  TM Distance: >3 FB Neck ROM: Full    Dental no notable dental hx.    Pulmonary Current Smoker and Patient abstained from smoking.,    Pulmonary exam normal        Cardiovascular negative cardio ROS Normal cardiovascular exam     Neuro/Psych PSYCHIATRIC DISORDERS Anxiety Depression negative neurological ROS     GI/Hepatic negative GI ROS, Neg liver ROS,   Endo/Other  negative endocrine ROS  Renal/GU negative Renal ROS     Musculoskeletal   Abdominal   Peds  Hematology negative hematology ROS (+)   Anesthesia Other Findings Lisfrance Fracture Dislocation Right Foot  Reproductive/Obstetrics hcg negative                            Anesthesia Physical Anesthesia Plan  ASA: 2  Anesthesia Plan: General and Regional   Post-op Pain Management: Regional block*   Induction: Intravenous  PONV Risk Score and Plan: 2 and Ondansetron, Dexamethasone, Midazolam and Treatment may vary due to age or medical condition  Airway Management Planned: LMA  Additional Equipment:   Intra-op Plan:   Post-operative Plan: Extubation in OR  Informed Consent: I have reviewed the patients History and Physical, chart, labs and discussed the procedure including the risks, benefits and alternatives for the proposed anesthesia with the patient or authorized representative who has indicated his/her understanding and acceptance.     Dental advisory given  Plan Discussed with: CRNA  Anesthesia Plan Comments: (PAT note written 03/02/2022 by Shonna Chock, PA-C. )       Anesthesia Quick Evaluation

## 2022-03-02 NOTE — Progress Notes (Signed)
Spoke with pt for pre-op call. Pt denies cardiac history. Pt states she is not diabetic. Pt's liver enzymes were elevated back in February. I asked her if she has had them checked and she stated that her employee health doctor at work had rechecked them in June or July and they were almost back to normal. She states she was told it was due to too much saturated fat.   Shower instructions given to pt .   Pt states she has a nose ring that she can't take out and also has a permanent bracelet on.

## 2022-03-02 NOTE — Progress Notes (Signed)
Anesthesia Chart Review: SAME DAY WORK-UP   Case: [id number] Date/Time: 03/03/22 1100   Procedure: MIDFOOT FUSION RIGHT FOOT (Right: Foot)   Anesthesia type: Choice   Pre-op diagnosis: Lisfrance Fracture Dislocation Right Foot   Location: [facility] OR ROOM 04 / [facility] OR   Surgeons: Nadara Mustard, MD       DISCUSSION: Patient is a 31 year old female scheduled for the above procedure. ED visit 02/27/22 for right foot pain and swelling after jumping off of a diving board. Imaging showed a comminuted fracture involving the medial cuneiform and additional fractures involving the lateral cuneiform and possibly the base of the 1st metatarsal with associated disruption of the Lisfranc ligament. She was treated with cam walker/crutches/pain medication and referred to orthopedics. She was evaluated by Dr. Lajoyce Corners on 03/02/22 and the above procedure recommended.   Other history includes smoking (0.25 PPD), anxiety, nephrolithiasis. Previous cocaine use, but none since 2015. She was previously a heavy drinker (May 2020), but now only reported occasional alcohol use.   ED visit on 08/13/21 for abdominal pain. LFTs were elevated (AST 48, ALT 100). She reported that she had been on Zeta but stopped about 2 weeks prior after her LFTs were in the "500 range". CT showed a 4 mm right distal ureteral stone with mild right hydronephrosis. She was given Toradol and advised out-patient follow-up with [facility]. Returned to ED on 08/20/21 with worsening flank pain--had not passed the stone yet. Cr 0.77. AST and ALT further elevated at 117 and 180, respectively. UA + hematuria, rare bacteria. Urine pregnancy test was negative. Treated with pain medications and instructed to keep urology appointment.   Per PAT RN phone interview, she reported that she had follow-up LFTs per her employer in June/July 2023 with results "almost back to normal" and told that LFT "were elevated due to high saturated fats" and advised low fat diet.   She is  a same day work-up, so she will need labs per anesthesia guidelines on arrival for surgery. If unable to provide copies of most recent labs, then she will likely need a CMP with her day of surgery labs.     VS:  BP Readings from Last 3 Encounters:  02/28/22 117/79  11/26/21 139/84  08/20/21 106/74   Pulse Readings from Last 3 Encounters:  02/28/22 96  11/26/21 91  08/20/21 79     PROVIDERS: Patient, No Pcp Per   LABS: See DISCUSSION. Last results include: Lab Results  Component Value Date   WBC 7.4 08/20/2021   HGB 14.0 08/20/2021   HCT 42.3 08/20/2021   PLT 284 08/20/2021   GLUCOSE 86 08/20/2021   ALT 180 (H) 08/20/2021   AST 117 (H) 08/20/2021   NA 136 08/20/2021   K 3.9 08/20/2021   CL 101 08/20/2021   CREATININE 0.77 08/20/2021   BUN 10 08/20/2021   CO2 24 08/20/2021     IMAGES: CT right foot 02/27/22: IMPRESSION: - Comminuted fracture involving the medial cuneiform. Additional fractures involving the lateral cuneiform and possibly the base of the 1st metatarsal. - Associated disruption of the Lisfranc ligament with ligamentous avulsion, as described above. Orthopedic consultation is suggested.  CT Abd/pelvis 08/13/21: IMPRESSION: - 4 mm distal right ureteral calculus at the level of the sacral ureter. Associated mild right hydronephrosis. - Normal appendix.    EKG: N/A  CV: N/A  Past Medical History:  Diagnosis Date   Anxiety    BV (bacterial vaginosis) 01/10/2013   Chlamydia    Contraceptive  management 10/18/2013   COVID    January 2022 and February 2023 - states she was very sick both times, no hospitalization   Elevated liver enzymes 08/2021   History of kidney stones    Postcoital bleeding 01/10/2013   Seasonal allergies    UTI (urinary tract infection)    Vaginal irritation 10/18/2013    Past Surgical History:  Procedure Laterality Date   NO PAST SURGERIES      MEDICATIONS: No current facility-administered medications for this  encounter.    albuterol (VENTOLIN HFA) 108 (90 Base) MCG/ACT inhaler   b complex vitamins capsule   buPROPion (WELLBUTRIN XL) 150 MG 24 hr tablet   ergocalciferol (VITAMIN D2) 1.25 MG (50000 UT) capsule   fluticasone (FLONASE) 50 MCG/ACT nasal spray   ibuprofen (ADVIL) 600 MG tablet   levonorgestrel (MIRENA) 20 MCG/DAY IUD   montelukast (SINGULAIR) 10 MG tablet   oxyCODONE-acetaminophen (PERCOCET/ROXICET) 5-325 MG tablet   traZODone (DESYREL) 50 MG tablet   ciprofloxacin-dexamethasone (CIPRODEX) OTIC suspension   ondansetron (ZOFRAN-ODT) 4 MG disintegrating tablet   oxyCODONE-acetaminophen (PERCOCET) 5-325 MG tablet    Shonna Chock, PA-C Surgical Short Stay/Anesthesiology [facility] Phone [phone] [facility] Phone [phone] 03/02/2022 2:05 PM

## 2022-03-03 ENCOUNTER — Ambulatory Visit (HOSPITAL_COMMUNITY)
Admission: RE | Admit: 2022-03-03 | Discharge: 2022-03-03 | Disposition: A | Discharge status: Home / Self Care | Payer: BC Managed Care – PPO | Attending: Orthopedic Surgery | Admitting: Orthopedic Surgery

## 2022-03-03 ENCOUNTER — Encounter (HOSPITAL_COMMUNITY)
Admission: RE | Disposition: A | Discharge status: Home / Self Care | Payer: Self-pay | Source: Home / Self Care | Attending: Orthopedic Surgery

## 2022-03-03 ENCOUNTER — Other Ambulatory Visit: Payer: Self-pay

## 2022-03-03 ENCOUNTER — Ambulatory Visit (HOSPITAL_COMMUNITY): Payer: BC Managed Care – PPO | Admitting: Vascular Surgery

## 2022-03-03 ENCOUNTER — Encounter (HOSPITAL_COMMUNITY): Payer: Self-pay | Admitting: Orthopedic Surgery

## 2022-03-03 ENCOUNTER — Ambulatory Visit (HOSPITAL_COMMUNITY): Payer: BC Managed Care – PPO

## 2022-03-03 DIAGNOSIS — S92241A Displaced fracture of medial cuneiform of right foot, initial encounter for closed fracture: Secondary | ICD-10-CM | POA: Insufficient documentation

## 2022-03-03 DIAGNOSIS — G8918 Other acute postprocedural pain: Secondary | ICD-10-CM | POA: Diagnosis not present

## 2022-03-03 DIAGNOSIS — X501XXA Overexertion from prolonged static or awkward postures, initial encounter: Secondary | ICD-10-CM | POA: Diagnosis not present

## 2022-03-03 DIAGNOSIS — F17210 Nicotine dependence, cigarettes, uncomplicated: Secondary | ICD-10-CM | POA: Insufficient documentation

## 2022-03-03 DIAGNOSIS — Y9339 Activity, other involving climbing, rappelling and jumping off: Secondary | ICD-10-CM | POA: Insufficient documentation

## 2022-03-03 DIAGNOSIS — S93324A Dislocation of tarsometatarsal joint of right foot, initial encounter: Secondary | ICD-10-CM

## 2022-03-03 DIAGNOSIS — S92301A Fracture of unspecified metatarsal bone(s), right foot, initial encounter for closed fracture: Secondary | ICD-10-CM | POA: Diagnosis not present

## 2022-03-03 HISTORY — DX: Personal history of urinary calculi: Z87.442

## 2022-03-03 HISTORY — PX: FOOT ARTHRODESIS: SHX1655

## 2022-03-03 HISTORY — DX: Anxiety disorder, unspecified: F41.9

## 2022-03-03 HISTORY — DX: Family history of other specified conditions: Z84.89

## 2022-03-03 HISTORY — DX: Other seasonal allergic rhinitis: J30.2

## 2022-03-03 HISTORY — DX: COVID-19: U07.1

## 2022-03-03 LAB — CBC
HCT: 41.2 % (ref 36.0–46.0)
Hemoglobin: 14 g/dL (ref 12.0–15.0)
MCH: 31.3 pg (ref 26.0–34.0)
MCHC: 34 g/dL (ref 30.0–36.0)
MCV: 92.2 fL (ref 80.0–100.0)
Platelets: 288 10*3/uL (ref 150–400)
RBC: 4.47 MIL/uL (ref 3.87–5.11)
RDW: 12 % (ref 11.5–15.5)
WBC: 6.2 10*3/uL (ref 4.0–10.5)
nRBC: 0 % (ref 0.0–0.2)

## 2022-03-03 LAB — COMPREHENSIVE METABOLIC PANEL
ALT: 16 U/L (ref 0–44)
AST: 18 U/L (ref 15–41)
Albumin: 3.9 g/dL (ref 3.5–5.0)
Alkaline Phosphatase: 43 U/L (ref 38–126)
Anion gap: 6 (ref 5–15)
BUN: 9 mg/dL (ref 6–20)
CO2: 26 mmol/L (ref 22–32)
Calcium: 9.2 mg/dL (ref 8.9–10.3)
Chloride: 106 mmol/L (ref 98–111)
Creatinine, Ser: 0.82 mg/dL (ref 0.44–1.00)
GFR, Estimated: >60 mL/min (ref >60)
Glucose, Bld: 95 mg/dL (ref 70–99)
Potassium: 4.1 mmol/L (ref 3.5–5.1)
Sodium: 138 mmol/L (ref 135–145)
Total Bilirubin: 0.6 mg/dL (ref 0.3–1.2)
Total Protein: 7.2 g/dL (ref 6.5–8.1)

## 2022-03-03 LAB — POCT PREGNANCY, URINE: Preg Test, Ur: NEGATIVE

## 2022-03-03 SURGERY — FUSION, JOINT, FOOT
Anesthesia: Regional | Site: Foot | Laterality: Right

## 2022-03-03 MED ORDER — MIDAZOLAM HCL 2 MG/2ML IJ SOLN
2.0000 mg | Freq: Once | INTRAMUSCULAR | Status: AC
Start: 2022-03-03 — End: 2022-03-03

## 2022-03-03 MED ORDER — OXYCODONE HCL 5 MG PO TABS
5.0000 mg | ORAL_TABLET | Freq: Once | ORAL | Status: AC | PRN
  Administered 2022-03-03: 5 mg via ORAL

## 2022-03-03 MED ORDER — DIPHENHYDRAMINE HCL 50 MG/ML IJ SOLN
6.2500 mg | Freq: Once | INTRAMUSCULAR | Status: AC
  Administered 2022-03-03: 6.5 mg via INTRAVENOUS

## 2022-03-03 MED ORDER — FENTANYL CITRATE (PF) 100 MCG/2ML IJ SOLN
25.0000 ug | INTRAMUSCULAR | Status: DC | PRN
  Administered 2022-03-03: 50 ug via INTRAVENOUS

## 2022-03-03 MED ORDER — LACTATED RINGERS IV SOLN: INTRAVENOUS | Status: DC

## 2022-03-03 MED ORDER — OXYCODONE HCL 5 MG/5ML PO SOLN: 5.0000 mg | Freq: Once | ORAL | Status: AC | PRN

## 2022-03-03 MED ORDER — FENTANYL CITRATE (PF) 100 MCG/2ML IJ SOLN
INTRAMUSCULAR | Status: AC
  Filled 2022-03-03: qty 2

## 2022-03-03 MED ORDER — MIDAZOLAM HCL 2 MG/2ML IJ SOLN
INTRAMUSCULAR | Status: AC
  Administered 2022-03-03: 2 mg via INTRAVENOUS
  Filled 2022-03-03: qty 2

## 2022-03-03 MED ORDER — HYDROMORPHONE HCL 1 MG/ML IJ SOLN
INTRAMUSCULAR | Status: DC | PRN
  Administered 2022-03-03: .5 mg via INTRAVENOUS

## 2022-03-03 MED ORDER — OXYCODONE-ACETAMINOPHEN 5-325 MG PO TABS: 1.0000 | ORAL_TABLET | ORAL | 0 refills | Status: DC | PRN

## 2022-03-03 MED ORDER — DEXAMETHASONE SODIUM PHOSPHATE 10 MG/ML IJ SOLN
INTRAMUSCULAR | Status: DC | PRN
  Administered 2022-03-03: 10 mg via INTRAVENOUS

## 2022-03-03 MED ORDER — MIDAZOLAM HCL 2 MG/2ML IJ SOLN
INTRAMUSCULAR | Status: AC
  Filled 2022-03-03: qty 2

## 2022-03-03 MED ORDER — OXYCODONE HCL 5 MG PO TABS
ORAL_TABLET | ORAL | Status: AC
  Filled 2022-03-03: qty 1

## 2022-03-03 MED ORDER — ONDANSETRON HCL 4 MG/2ML IJ SOLN
INTRAMUSCULAR | Status: DC | PRN
  Administered 2022-03-03: 4 mg via INTRAVENOUS

## 2022-03-03 MED ORDER — HYDROMORPHONE HCL 1 MG/ML IJ SOLN
INTRAMUSCULAR | Status: AC
  Filled 2022-03-03: qty 0.5

## 2022-03-03 MED ORDER — FENTANYL CITRATE (PF) 250 MCG/5ML IJ SOLN
INTRAMUSCULAR | Status: DC | PRN
Start: 2022-03-03 — End: 2022-03-03
  Administered 2022-03-03 (×2): 50 ug via INTRAVENOUS

## 2022-03-03 MED ORDER — LIDOCAINE 2% (20 MG/ML) 5 ML SYRINGE
INTRAMUSCULAR | Status: DC | PRN
  Administered 2022-03-03: 40 mg via INTRAVENOUS

## 2022-03-03 MED ORDER — FENTANYL CITRATE (PF) 250 MCG/5ML IJ SOLN
INTRAMUSCULAR | Status: AC
  Filled 2022-03-03: qty 5

## 2022-03-03 MED ORDER — CEFAZOLIN SODIUM-DEXTROSE 2-4 GM/100ML-% IV SOLN
2.0000 g | INTRAVENOUS | Status: AC
  Administered 2022-03-03: 2 g via INTRAVENOUS
  Filled 2022-03-03: qty 100

## 2022-03-03 MED ORDER — DIPHENHYDRAMINE HCL 50 MG/ML IJ SOLN
INTRAMUSCULAR | Status: AC
  Filled 2022-03-03: qty 1

## 2022-03-03 MED ORDER — PROPOFOL 10 MG/ML IV BOLUS
INTRAVENOUS | Status: DC | PRN
  Administered 2022-03-03: 200 mg via INTRAVENOUS

## 2022-03-03 MED ORDER — FENTANYL CITRATE (PF) 100 MCG/2ML IJ SOLN
INTRAMUSCULAR | Status: AC
  Administered 2022-03-03: 100 ug via INTRAVENOUS
  Filled 2022-03-03: qty 2

## 2022-03-03 MED ORDER — MIDAZOLAM HCL 2 MG/2ML IJ SOLN
INTRAMUSCULAR | Status: DC | PRN
  Administered 2022-03-03: 1 mg via INTRAVENOUS

## 2022-03-03 MED ORDER — ACETAMINOPHEN 10 MG/ML IV SOLN
INTRAVENOUS | Status: AC
  Filled 2022-03-03: qty 100

## 2022-03-03 MED ORDER — 0.9 % SODIUM CHLORIDE (POUR BTL) OPTIME
TOPICAL | Status: DC | PRN
  Administered 2022-03-03: 1000 mL

## 2022-03-03 MED ORDER — FENTANYL CITRATE (PF) 100 MCG/2ML IJ SOLN: 100.0000 ug | Freq: Once | INTRAMUSCULAR | Status: AC

## 2022-03-03 MED ORDER — ACETAMINOPHEN 10 MG/ML IV SOLN
1000.0000 mg | Freq: Once | INTRAVENOUS | Status: DC | PRN
  Administered 2022-03-03: 1000 mg via INTRAVENOUS

## 2022-03-03 MED ORDER — ORAL CARE MOUTH RINSE
15.0000 mL | Freq: Once | OROMUCOSAL | Status: AC
Start: 2022-03-03 — End: 2022-03-03

## 2022-03-03 MED ORDER — AMISULPRIDE (ANTIEMETIC) 5 MG/2ML IV SOLN: 10.0000 mg | Freq: Once | INTRAVENOUS | Status: DC | PRN

## 2022-03-03 MED ORDER — PROMETHAZINE HCL 25 MG/ML IJ SOLN: 6.2500 mg | INTRAMUSCULAR | Status: DC | PRN

## 2022-03-03 MED ORDER — BUPIVACAINE-EPINEPHRINE (PF) 0.5% -1:200000 IJ SOLN
INTRAMUSCULAR | Status: DC | PRN
  Administered 2022-03-03: 30 mL via PERINEURAL

## 2022-03-03 MED ORDER — CHLORHEXIDINE GLUCONATE 0.12 % MT SOLN
15.0000 mL | Freq: Once | OROMUCOSAL | Status: AC
  Administered 2022-03-03: 15 mL via OROMUCOSAL
  Filled 2022-03-03: qty 15

## 2022-03-03 SURGICAL SUPPLY — 49 items
BAG COUNTER SPONGE SURGICOUNT (BAG) ×1 IMPLANT
BANDAGE ESMARK 6X9 LF (GAUZE/BANDAGES/DRESSINGS) IMPLANT
BIT DRILL 3.0 6IN (DRILL) IMPLANT
BLADE SAW SGTL 81X20 HD (BLADE) IMPLANT
BLADE SAW SGTL HD 18.5X60.5X1. (BLADE) ×1 IMPLANT
BLADE SURG 10 STRL SS (BLADE) IMPLANT
BNDG COHESIVE 4X5 TAN ST LF (GAUZE/BANDAGES/DRESSINGS) IMPLANT
BNDG COHESIVE 4X5 TAN STRL (GAUZE/BANDAGES/DRESSINGS) ×1 IMPLANT
BNDG ESMARK 6X9 LF (GAUZE/BANDAGES/DRESSINGS) ×1
BNDG GAUZE DERMACEA FLUFF 4 (GAUZE/BANDAGES/DRESSINGS) ×2 IMPLANT
COTTON STERILE ROLL (GAUZE/BANDAGES/DRESSINGS) ×1 IMPLANT
COVER MAYO STAND STRL (DRAPES) IMPLANT
COVER SURGICAL LIGHT HANDLE (MISCELLANEOUS) ×2 IMPLANT
DRAPE INCISE IOBAN 66X45 STRL (DRAPES) ×1 IMPLANT
DRAPE OEC MINIVIEW 54X84 (DRAPES) IMPLANT
DRAPE U-SHAPE 47X51 STRL (DRAPES) ×1 IMPLANT
DRILL 3.0 6IN (DRILL) ×1
DRSG ADAPTIC 3X8 NADH LF (GAUZE/BANDAGES/DRESSINGS) ×1 IMPLANT
DRSG EMULSION OIL 3X3 NADH (GAUZE/BANDAGES/DRESSINGS) IMPLANT
DRSG PAD ABDOMINAL 8X10 ST (GAUZE/BANDAGES/DRESSINGS) IMPLANT
DURAPREP 26ML APPLICATOR (WOUND CARE) ×1 IMPLANT
ELECT REM PT RETURN 9FT ADLT (ELECTROSURGICAL) ×1
ELECTRODE REM PT RTRN 9FT ADLT (ELECTROSURGICAL) ×1 IMPLANT
GAUZE SPONGE 4X4 12PLY STRL (GAUZE/BANDAGES/DRESSINGS) ×1 IMPLANT
GLOVE BIOGEL PI IND STRL 9 (GLOVE) ×1 IMPLANT
GLOVE BIOGEL PI INDICATOR 9 (GLOVE) ×1
GLOVE SURG ORTHO 9.0 STRL STRW (GLOVE) ×1 IMPLANT
GOWN STRL REUS W/ TWL XL LVL3 (GOWN DISPOSABLE) ×3 IMPLANT
GOWN STRL REUS W/TWL XL LVL3 (GOWN DISPOSABLE) ×3
GUIDEWIRE 1.6 6IN (WIRE) IMPLANT
KIT BASIN OR (CUSTOM PROCEDURE TRAY) ×1 IMPLANT
KIT TURNOVER KIT B (KITS) ×1 IMPLANT
MANIFOLD NEPTUNE II (INSTRUMENTS) ×1 IMPLANT
NS IRRIG 1000ML POUR BTL (IV SOLUTION) ×1 IMPLANT
PACK ORTHO EXTREMITY (CUSTOM PROCEDURE TRAY) ×1 IMPLANT
PAD ARMBOARD 7.5X6 YLW CONV (MISCELLANEOUS) ×2 IMPLANT
PAD CAST 4YDX4 CTTN HI CHSV (CAST SUPPLIES) ×1 IMPLANT
PADDING CAST COTTON 4X4 STRL (CAST SUPPLIES) ×1
SCREW CANN HDLS SHORT 4X30 (Screw) IMPLANT
SCREW CANN SHT HDLS 4X36 (Screw) IMPLANT
SCREW CANN SHT HDLS 4X40 (Screw) IMPLANT
SPONGE T-LAP 18X18 ~~LOC~~+RFID (SPONGE) ×1 IMPLANT
SUCTION FRAZIER HANDLE 10FR (MISCELLANEOUS) ×1
SUCTION TUBE FRAZIER 10FR DISP (MISCELLANEOUS) ×1 IMPLANT
SUT ETHILON 2 0 PSLX (SUTURE) ×3 IMPLANT
TOWEL GREEN STERILE (TOWEL DISPOSABLE) ×1 IMPLANT
TOWEL GREEN STERILE FF (TOWEL DISPOSABLE) ×1 IMPLANT
TUBE CONNECTING 12X1/4 (SUCTIONS) ×1 IMPLANT
WATER STERILE IRR 1000ML POUR (IV SOLUTION) ×1 IMPLANT

## 2022-03-03 NOTE — Transfer of Care (Signed)
Immediate Anesthesia Transfer of Care Note  Patient: Renee English  Procedure(s) Performed: MIDFOOT FUSION RIGHT FOOT (Right: Foot)  Patient Location: PACU  Anesthesia Type:General  Level of Consciousness: awake, drowsy and patient cooperative  Airway & Oxygen Therapy: Patient Spontanous Breathing and Patient connected to nasal cannula oxygen  Post-op Assessment: Report given to RN and Post -op Vital signs reviewed and stable  Post vital signs: Reviewed and stable  Last Vitals:  Vitals Value Taken Time  BP 120/75 03/03/22 1231  Temp    Pulse 94 03/03/22 1232  Resp 12 03/03/22 1232  SpO2 100 % 03/03/22 1232  Vitals shown include unvalidated device data.  Last Pain:  Vitals:   03/03/22 0933  TempSrc:   PainSc: 8       Patients Stated Pain Goal: 0 (03/03/22 0933)  Complications: No notable events documented.

## 2022-03-03 NOTE — Anesthesia Postprocedure Evaluation (Signed)
Anesthesia Post Note  Patient: Renee English  Procedure(s) Performed: MIDFOOT FUSION RIGHT FOOT (Right: Foot)     Patient location during evaluation: PACU Anesthesia Type: Regional and General Level of consciousness: awake Pain management: pain level controlled Vital Signs Assessment: post-procedure vital signs reviewed and stable Respiratory status: spontaneous breathing, nonlabored ventilation, respiratory function stable and patient connected to nasal cannula oxygen Cardiovascular status: blood pressure returned to baseline and stable Postop Assessment: no apparent nausea or vomiting Anesthetic complications: no   No notable events documented.  Last Vitals:  Vitals:   03/03/22 1300 03/03/22 1315  BP: 108/75 108/80  Pulse: 84 84  Resp: 15 15  Temp:  36.8 C  SpO2: 97% 95%    Last Pain:  Vitals:   03/03/22 1315  TempSrc:   PainSc: 2                  Devri Kreher P Enas Winchel

## 2022-03-03 NOTE — H&P (Signed)
Renee English is an 31 y.o. female.   Chief Complaint: Right foot pain HPI: Patient is a 31 year old woman who is seen for initial evaluation for Lisfranc fracture dislocation right foot.  Patient states that she was using a diving board when her foot bent backwards.  Patient had immediate onset of pain is unable to weight-bear secondary to pain.  Past Medical History:  Diagnosis Date   Anxiety    BV (bacterial vaginosis) 01/10/2013   Chlamydia    Contraceptive management 10/18/2013   COVID    January 2022 and February 2023 - states she was very sick both times, no hospitalization   Elevated liver enzymes 08/2021   History of kidney stones    Postcoital bleeding 01/10/2013   Seasonal allergies    UTI (urinary tract infection)    Vaginal irritation 10/18/2013    Past Surgical History:  Procedure Laterality Date   NO PAST SURGERIES      Family History  Problem Relation Age of Onset   Diabetes Maternal Grandmother    Hypertension Maternal Grandmother    Cancer Maternal Grandmother        breast   Hypertension Maternal Grandfather    Diabetes Maternal Grandfather    Cancer Maternal Aunt        breast   Social History:  reports that she has been smoking cigarettes. She has been smoking an average of .25 packs per day. She has never used smokeless tobacco. She reports current alcohol use. She reports that she does not use drugs.  Allergies:  Allergies  Allergen Reactions   Sulfa Antibiotics Anaphylaxis, Hives and Swelling   Sesame Oil Hives and Swelling   Shellfish Allergy Hives and Swelling    No medications prior to admission.    No results found for this or any previous visit (from the past 48 hour(s)). No results found.  Review of Systems  All other systems reviewed and are negative.   There were no vitals taken for this visit. Physical Exam  Patient is alert, oriented, no adenopathy, well-dressed, normal affect, normal respiratory effort. Examination  patient has a palpable dorsalis pedis pulse she has ecchymosis and bruising into the toes of her foot.  There is swelling there is no blisters.  She has a palpable dorsalis pedis pulse.   Review of the CT scan shows comminution through the medial cuneiform and fractures across the base of the second metatarsal Lisfranc complex. Assessment/Plan 1. Lisfranc dislocation, right, initial encounter       Plan: With the unstable Lisfranc fracture dislocation involving the base of the first and second metatarsal.  I have recommended proceeding with fusion across the Lisfranc complex.  Risk and benefits were discussed including infection neurovascular injury persistent pain need for additional surgery.  Patient states she understands wished to proceed at this time.  Nadara Mustard, MD 03/03/2022, 6:43 AM

## 2022-03-03 NOTE — Op Note (Signed)
03/03/2022  12:22 PM  PATIENT:  Renee English    PRE-OPERATIVE DIAGNOSIS:  Lisfrance Fracture Dislocation Right Foot  POST-OPERATIVE DIAGNOSIS:  Same  PROCEDURE: Open reduction internal fixation Lisfranc fracture dislocation. C arm fluoroscopy to verify alignment.  SURGEON:  Nadara Mustard, MD  PHYSICIAN ASSISTANT:None ANESTHESIA:   General  PREOPERATIVE INDICATIONS:  Renee English is a  31 y.o. female with a diagnosis of Lisfrance Fracture Dislocation Right Foot who failed conservative measures and elected for surgical management.    The risks benefits and alternatives were discussed with the patient preoperatively including but not limited to the risks of infection, bleeding, nerve injury, cardiopulmonary complications, the need for revision surgery, among others, and the patient was willing to proceed.  OPERATIVE IMPLANTS: 4.0 headless cannulated screws x3.  @ENCIMAGES @  OPERATIVE FINDINGS: C-arm fluoroscopy verified reduction of the internal fixation.  Patient had comminution of the medial cuneiform and further resection of the articular cartilage of the base of the first metatarsal medial cuneiform was not performed due to risk of further destabilizing the medial cuneiform fractures.  OPERATIVE PROCEDURE: Patient was brought the operating room and underwent general anesthetic.  After adequate levels anesthesia obtained patient's right lower extremity was prepped using DuraPrep draped into a sterile field a timeout was called.  A dorsal incision was made over the first webspace.  Blunt dissection was carried down between the interval of the anterior tibial tendon and the EHL tendon.  Dissection was carried down through the tendons and subperiosteal dissection was used to identify the fracture.  There was significant comminution of the base of the first metatarsal and the medial cuneiform.  It was elected not to do a fusion due to potential destabilizing of these multiple  fragments.  The fracture was reduced a K wire was inserted from the medial cuneiform to the middle cuneiform a K wire was inserted from the base of the first metatarsal to the medial cuneiform and a wire was inserted across the Lisfranc ligament from the medial cuneiform to the base of the second metatarsal.  See arthroscopy verified alignment.  4.0 headless cannulated screws were then used to stabilize the fracture with a 40 mm 30 mm and 36 mm screw.  The wound was irrigated normal saline and sterile proximally verified reduction.  The incision was closed using 2-0 nylon sterile dressing was applied patient was extubated taken the PACU in stable condition.   DISCHARGE PLANNING:  Antibiotic duration: Preoperative antibiotics  Weightbearing: Nonweightbearing on the right  Pain medication: Prescription for Percocet  Dressing care/ Wound VAC: Dry dressing  Ambulatory devices: Walker or crutches or kneeling scooter  Discharge to: Home.  Follow-up: In the office 1 week post operative.

## 2022-03-03 NOTE — Anesthesia Procedure Notes (Signed)
Anesthesia Regional Block: Popliteal block   Pre-Anesthetic Checklist: , timeout performed,  Correct Patient, Correct Site, Correct Laterality,  Correct Procedure, Correct Position, site marked,  Risks and benefits discussed,  Surgical consent,  Pre-op evaluation,  At surgeon's request and post-op pain management  Laterality: Right  Prep: chloraprep       Needles:  Injection technique: Single-shot  Needle Type: Echogenic Stimulator Needle     Needle Length: 10cm  Needle Gauge: 20     Additional Needles:   Procedures:,,,, ultrasound used (permanent image in chart),,    Narrative:  Start time: 03/03/2022 10:20 AM End time: 03/03/2022 10:30 AM Injection made incrementally with aspirations every 5 mL.  Performed by: Personally  Anesthesiologist: Leonides Grills, MD  Additional Notes: Functioning IV was confirmed and monitors were applied.  A timeout was performed. Sterile prep, hand hygiene and sterile gloves were used. A 20ga Bbraun echogenic stimulator needle was used. Negative aspiration and negative test dose prior to incremental administration of local anesthetic. The patient tolerated the procedure well.  Ultrasound guidance: relevent anatomy identified, needle position confirmed, local anesthetic spread visualized around nerve(s), vascular puncture avoided.  Image printed for medical record.

## 2022-03-03 NOTE — Anesthesia Procedure Notes (Signed)
Procedure Name: LMA Insertion Date/Time: 03/03/2022 11:43 AM  Performed by: Ayesha Rumpf, CRNAPre-anesthesia Checklist: Patient identified, Emergency Drugs available, Suction available and Patient being monitored Patient Re-evaluated:Patient Re-evaluated prior to induction Oxygen Delivery Method: Circle System Utilized Preoxygenation: Pre-oxygenation with 100% oxygen Induction Type: IV induction LMA: LMA inserted LMA Size: 4.0 Number of attempts: 1 Placement Confirmation: positive ETCO2 Tube secured with: Tape Dental Injury: Teeth and Oropharynx as per pre-operative assessment

## 2022-03-04 ENCOUNTER — Encounter (HOSPITAL_COMMUNITY): Payer: Self-pay | Admitting: Orthopedic Surgery

## 2022-03-09 ENCOUNTER — Ambulatory Visit: Payer: BC Managed Care – PPO | Admitting: Orthopedic Surgery

## 2022-03-15 ENCOUNTER — Telehealth: Payer: Self-pay | Admitting: Orthopedic Surgery

## 2022-03-15 ENCOUNTER — Encounter: Payer: Self-pay | Admitting: Orthopedic Surgery

## 2022-03-15 ENCOUNTER — Ambulatory Visit (INDEPENDENT_AMBULATORY_CARE_PROVIDER_SITE_OTHER): Payer: BC Managed Care – PPO | Admitting: Orthopedic Surgery

## 2022-03-15 DIAGNOSIS — S93324A Dislocation of tarsometatarsal joint of right foot, initial encounter: Secondary | ICD-10-CM

## 2022-03-15 NOTE — Progress Notes (Signed)
Office Visit Note   Patient: Renee English           Date of Birth: [birth year]           MRN: [id number] Visit Date: 03/15/2022              Requested by: Oliver Barre, MD [phone] [place],  [place] [address] PCP: Patient, No Pcp Per  Chief Complaint  Patient presents with   Right Foot - Routine Post Op    03/03/2022 right midfoot fusion due to lisfranc dislocation      HPI: Patient is a 31 year old woman who is 1 week status post open reduction internal fixation Lisfranc fracture dislocation right foot.  Patient has no complaints.  Assessment & Plan: Visit Diagnoses:  1. Lisfranc dislocation, right, initial encounter     Plan: Patient was given instructions and demonstrated Achilles stretching range of motion of the toes continue nonweightbearing Dial soap cleansing continue ice and elevation.  Three-view radiographs of the right foot at follow-up.  Follow-Up Instructions: Return in about 1 week (around 03/22/2022).   Ortho Exam  Patient is alert, oriented, no adenopathy, well-dressed, normal affect, normal respiratory effort. Examination the incision is well approximated there is no redness no cellulitis no drainage no signs of infection.  Patient does have decreased sensation of the first webspace.  Imaging: No results found. No images are attached to the encounter.  Labs: Lab Results  Component Value Date   REPTSTATUS 10/15/2018 FINAL 10/14/2018   CULT  10/14/2018    NO GROWTH Performed at [facility], [address], [place], [place] [address]    LABORGA NO GROWTH 01/10/2013     Lab Results  Component Value Date   ALBUMIN 3.9 03/03/2022   ALBUMIN 4.6 08/20/2021   ALBUMIN 4.6 08/13/2021    No results found for: "MG" No results found for: "VD25OH"  No results found for: "PREALBUMIN"    Latest Ref Rng & Units 03/03/2022    9:25 AM 08/20/2021    2:45 PM 08/13/2021    5:38 PM  CBC EXTENDED  WBC 4.0 - 10.5 K/uL 6.2  7.4  10.2   RBC 3.87 -  5.11 MIL/uL 4.47  4.48  4.48   Hemoglobin 12.0 - 15.0 g/dL 77.8  24.2  35.3   HCT 36.0 - 46.0 % 41.2  42.3  42.4   Platelets 150 - 400 K/uL 288  284  320   NEUT# 1.7 - 7.7 K/uL  5.1    Lymph# 0.7 - 4.0 K/uL  1.7       There is no height or weight on file to calculate BMI.  Orders:  No orders of the defined types were placed in this encounter.  No orders of the defined types were placed in this encounter.    Procedures: No procedures performed  Clinical Data: No additional findings.  ROS:  All other systems negative, except as noted in the HPI. Review of Systems  Objective: Vital Signs: There were no vitals taken for this visit.  Specialty Comments:  No specialty comments available.  PMFS History: Patient Active Problem List   Diagnosis Date Noted   Lisfranc dislocation, right, initial encounter    Alcohol abuse 11/07/2018   MDD (major depressive disorder), single episode, severe , no psychosis (HCC) 11/07/2018   Suicidal behavior 11/06/2018   Suicide attempt (HCC) 11/06/2018   Contraceptive management 10/18/2013   Vaginal irritation 10/18/2013   BV (bacterial vaginosis) 01/10/2013  Postcoital bleeding 01/10/2013   Past Medical History:  Diagnosis Date   Anxiety    BV (bacterial vaginosis) 01/10/2013   Chlamydia    Contraceptive management 10/18/2013   COVID    January 2022 and February 2023 - states she was very sick both times, no hospitalization   Elevated liver enzymes 08/2021   Family history of adverse reaction to anesthesia    Mother is hard to wake up and father has experienced post op nausea and vomiting   History of kidney stones    Postcoital bleeding 01/10/2013   Seasonal allergies    UTI (urinary tract infection)    Vaginal irritation 10/18/2013    Family History  Problem Relation Age of Onset   Diabetes Maternal Grandmother    Hypertension Maternal Grandmother    Cancer Maternal Grandmother        breast   Hypertension Maternal  Grandfather    Diabetes Maternal Grandfather    Cancer Maternal Aunt        breast    Past Surgical History:  Procedure Laterality Date   FOOT ARTHRODESIS Right 03/03/2022   Procedure: MIDFOOT FUSION RIGHT FOOT;  Surgeon: Nadara Mustard, MD;  Location: [facility] OR;  Service: Orthopedics;  Laterality: Right;   NO PAST SURGERIES     Social History   Occupational History   Not on file  Tobacco Use   Smoking status: Some Days    Packs/day: 0.25    Types: Cigarettes   Smokeless tobacco: Never  Vaping Use   Vaping Use: Former  Substance and Sexual Activity   Alcohol use: Yes    Comment: occasional   Drug use: No    Comment: pt former cocaine user - since 2015   Sexual activity: Not Currently    Birth control/protection: Inserts

## 2022-03-15 NOTE — Telephone Encounter (Signed)
Received $25.00 cash,medical records release form and disability paperwork from patient/forwarding to [employer] today °

## 2022-03-22 ENCOUNTER — Ambulatory Visit (INDEPENDENT_AMBULATORY_CARE_PROVIDER_SITE_OTHER): Payer: BC Managed Care – PPO

## 2022-03-22 ENCOUNTER — Encounter: Payer: Self-pay | Admitting: Orthopedic Surgery

## 2022-03-22 ENCOUNTER — Ambulatory Visit (INDEPENDENT_AMBULATORY_CARE_PROVIDER_SITE_OTHER): Payer: BC Managed Care – PPO | Admitting: Orthopedic Surgery

## 2022-03-22 DIAGNOSIS — S93324A Dislocation of tarsometatarsal joint of right foot, initial encounter: Secondary | ICD-10-CM | POA: Diagnosis not present

## 2022-03-22 NOTE — Progress Notes (Signed)
Office Visit Note   Patient: Renee English           Date of Birth: April 01, 1991           MRN: [id number] Visit Date: 03/22/2022              Requested by: No referring provider defined for this encounter. PCP: Patient, No Pcp Per  Chief Complaint  Patient presents with   Right Foot - Routine Post Op    03/03/2022 right midfoot fusion due to lisfranc dislocation      HPI: Patient is a 31 year old woman who presents 2 and half weeks status post internal fixation for Lisfranc fracture dislocation of the right foot.  Patient states her foot feels much better than it did before surgery.  Assessment & Plan: Visit Diagnoses:  1. Lisfranc dislocation, right, initial encounter     Plan: Sutures harvested today she will start Dial soap cleansing and scar massage with Shea butter daily.  She is given instructions on working on dorsiflexion of the ankle range of motion of her toes.  Three-view radiographs of the right foot at follow-up.  Follow-Up Instructions: Return in about 3 weeks (around 04/12/2022).   Ortho Exam  Patient is alert, oriented, no adenopathy, well-dressed, normal affect, normal respiratory effort. Examination the incision is well-healed there is swelling she has decreased range of motion of the toes and ankle.  Imaging: XR Foot Complete Right  Result Date: 03/22/2022 Three-view radiographs of the right foot shows stable internal fixation with restoration of the reduction of the Lisfranc complex and the arch of the foot.  No images are attached to the encounter.  Labs: Lab Results  Component Value Date   REPTSTATUS 10/15/2018 FINAL 10/14/2018   CULT  10/14/2018    NO GROWTH Performed at [facility], [place] [address], [place], [place] [address]    LABORGA NO GROWTH 01/10/2013     Lab Results  Component Value Date   ALBUMIN 3.9 03/03/2022   ALBUMIN 4.6 08/20/2021   ALBUMIN 4.6 08/13/2021    No results found for: "MG" No results found for:  "VD25OH"  No results found for: "PREALBUMIN"    Latest Ref Rng & Units 03/03/2022    9:25 AM 08/20/2021    2:45 PM 08/13/2021    5:38 PM  CBC EXTENDED  WBC 4.0 - 10.5 K/uL 6.2  7.4  10.2   RBC 3.87 - 5.11 MIL/uL 4.47  4.48  4.48   Hemoglobin 12.0 - 15.0 g/dL 14.0  14.0  14.3   HCT 36.0 - 46.0 % 41.2  42.3  42.4   Platelets 150 - 400 K/uL 288  284  320   NEUT# 1.7 - 7.7 K/uL  5.1    Lymph# 0.7 - 4.0 K/uL  1.7       There is no height or weight on file to calculate BMI.  Orders:  Orders Placed This Encounter  Procedures   XR Foot Complete Right   No orders of the defined types were placed in this encounter.    Procedures: No procedures performed  Clinical Data: No additional findings.  ROS:  All other systems negative, except as noted in the HPI. Review of Systems  Objective: Vital Signs: There were no vitals taken for this visit.  Specialty Comments:  No specialty comments available.  PMFS History: Patient Active Problem List   Diagnosis Date Noted   Lisfranc dislocation, right, initial encounter    Alcohol abuse 11/07/2018  MDD (major depressive disorder), single episode, severe , no psychosis (HCC) 11/07/2018   Suicidal behavior 11/06/2018   Suicide attempt (HCC) 11/06/2018   Contraceptive management 10/18/2013   Vaginal irritation 10/18/2013   BV (bacterial vaginosis) 01/10/2013   Postcoital bleeding 01/10/2013   Past Medical History:  Diagnosis Date   Anxiety    BV (bacterial vaginosis) 01/10/2013   Chlamydia    Contraceptive management 10/18/2013   COVID    January 2022 and February 2023 - states she was very sick both times, no hospitalization   Elevated liver enzymes 08/2021   Family history of adverse reaction to anesthesia    Mother is hard to wake up and father has experienced post op nausea and vomiting   History of kidney stones    Postcoital bleeding 01/10/2013   Seasonal allergies    UTI (urinary tract infection)    Vaginal  irritation 10/18/2013    Family History  Problem Relation Age of Onset   Diabetes Maternal Grandmother    Hypertension Maternal Grandmother    Cancer Maternal Grandmother        breast   Hypertension Maternal Grandfather    Diabetes Maternal Grandfather    Cancer Maternal Aunt        breast    Past Surgical History:  Procedure Laterality Date   FOOT ARTHRODESIS Right 03/03/2022   Procedure: MIDFOOT FUSION RIGHT FOOT;  Surgeon: Nadara Mustard, MD;  Location: [facility] OR;  Service: Orthopedics;  Laterality: Right;   NO PAST SURGERIES     Social History   Occupational History   Not on file  Tobacco Use   Smoking status: Some Days    Packs/day: 0.25    Types: Cigarettes   Smokeless tobacco: Never  Vaping Use   Vaping Use: Former  Substance and Sexual Activity   Alcohol use: Yes    Comment: occasional   Drug use: No    Comment: pt former cocaine user - since 2015   Sexual activity: Not Currently    Birth control/protection: Inserts

## 2022-03-31 ENCOUNTER — Encounter: Payer: Self-pay | Admitting: Emergency Medicine

## 2022-03-31 ENCOUNTER — Ambulatory Visit
Admission: EM | Admit: 2022-03-31 | Discharge: 2022-03-31 | Disposition: A | Discharge status: Home / Self Care | Payer: BC Managed Care – PPO | Attending: Family Medicine | Admitting: Family Medicine

## 2022-03-31 ENCOUNTER — Other Ambulatory Visit: Payer: Self-pay

## 2022-03-31 DIAGNOSIS — R051 Acute cough: Secondary | ICD-10-CM

## 2022-03-31 DIAGNOSIS — J069 Acute upper respiratory infection, unspecified: Secondary | ICD-10-CM | POA: Insufficient documentation

## 2022-03-31 DIAGNOSIS — R0981 Nasal congestion: Secondary | ICD-10-CM | POA: Diagnosis not present

## 2022-03-31 DIAGNOSIS — Z8616 Personal history of COVID-19: Secondary | ICD-10-CM | POA: Diagnosis not present

## 2022-03-31 DIAGNOSIS — Z1152 Encounter for screening for COVID-19: Secondary | ICD-10-CM | POA: Diagnosis not present

## 2022-03-31 DIAGNOSIS — F17210 Nicotine dependence, cigarettes, uncomplicated: Secondary | ICD-10-CM | POA: Diagnosis not present

## 2022-03-31 LAB — RESP PANEL BY RT-PCR (FLU A&B, COVID) ARPGX2
Influenza A by PCR: NEGATIVE
Influenza B by PCR: NEGATIVE
SARS Coronavirus 2 by RT PCR: NEGATIVE

## 2022-03-31 MED ORDER — PROMETHAZINE-DM 6.25-15 MG/5ML PO SYRP: 5.0000 mL | ORAL_SOLUTION | Freq: Four times a day (QID) | ORAL | 0 refills | Status: DC | PRN

## 2022-03-31 MED ORDER — FLUTICASONE PROPIONATE 50 MCG/ACT NA SUSP: 1.0000 | Freq: Two times a day (BID) | NASAL | 2 refills | Status: DC

## 2022-03-31 NOTE — ED Triage Notes (Signed)
Pt reports body aches, nasal congestion, sore throat, and left ear pain since Monday. Denies any known fevers.

## 2022-03-31 NOTE — ED Provider Notes (Signed)
[facility]    CSN: [id number] Arrival date & time: 03/31/22  1356      History   Chief Complaint Chief Complaint  Patient presents with   Generalized Body Aches    HPI Renee English is a 31 y.o. female.   Presenting today with 3-day history of body aches, chills, nasal congestion, left ear pain.  Denies known fevers, abdominal pain, nausea vomiting diarrhea, chest pain, shortness of breath.  So far trying Mucinex, her daily Singulair for seasonal allergies with minimal relief.  No known sick contacts that she is aware of.  No known history of chronic pulmonary disease.    Past Medical History:  Diagnosis Date   Anxiety    BV (bacterial vaginosis) 01/10/2013   Chlamydia    Contraceptive management 10/18/2013   COVID    January 2022 and February 2023 - states she was very sick both times, no hospitalization   Elevated liver enzymes 08/2021   Family history of adverse reaction to anesthesia    Mother is hard to wake up and father has experienced post op nausea and vomiting   History of kidney stones    Postcoital bleeding 01/10/2013   Seasonal allergies    UTI (urinary tract infection)    Vaginal irritation 10/18/2013    Patient Active Problem List   Diagnosis Date Noted   Lisfranc dislocation, right, initial encounter    Alcohol abuse 11/07/2018   MDD (major depressive disorder), single episode, severe , no psychosis ([place]) 11/07/2018   Suicidal behavior 11/06/2018   Suicide attempt ([place]) 11/06/2018   Contraceptive management 10/18/2013   Vaginal irritation 10/18/2013   BV (bacterial vaginosis) 01/10/2013   Postcoital bleeding 01/10/2013    Past Surgical History:  Procedure Laterality Date   FOOT ARTHRODESIS Right 03/03/2022   Procedure: MIDFOOT FUSION RIGHT FOOT;  Surgeon: Newt Minion, MD;  Location: [facility];  Service: Orthopedics;  Laterality: Right;   NO PAST SURGERIES      OB History   No obstetric history on file.      Home  Medications    Prior to Admission medications   Medication Sig Start Date End Date Taking? Authorizing Provider  fluticasone (FLONASE) 50 MCG/ACT nasal spray Place 1 spray into both nostrils 2 (two) times daily. 03/31/22  Yes Volney American, PA-C  oxyCODONE-acetaminophen (PERCOCET/ROXICET) 5-325 MG tablet Take 1 tablet by mouth every 4 (four) hours as needed. 03/03/22  Yes Newt Minion, MD  promethazine-dextromethorphan (PROMETHAZINE-DM) 6.25-15 MG/5ML syrup Take 5 mLs by mouth 4 (four) times daily as needed. 03/31/22  Yes Volney American, PA-C  traZODone (DESYREL) 50 MG tablet Take 75 mg by mouth at bedtime. 04/17/21  Yes [provider]  albuterol (VENTOLIN HFA) 108 (90 Base) MCG/ACT inhaler Inhale 2 puffs into the lungs every 6 (six) hours as needed for wheezing or shortness of breath. 11/26/21   Leath-Warren, Alda Lea, NP  b complex vitamins capsule Take 1 capsule by mouth every other day.    [provider]  buPROPion (WELLBUTRIN XL) 150 MG 24 hr tablet Take 150 mg by mouth daily. 02/17/21   [provider]  ciprofloxacin-dexamethasone (CIPRODEX) OTIC suspension Place 4 drops into the left ear 2 (two) times daily. Patient not taking: Reported on 03/02/2022 02/24/22   Mar Daring, PA-C  ergocalciferol (VITAMIN D2) 1.25 MG (50000 UT) capsule Take 50,000 Units by mouth once a week.    [provider]  fluticasone (FLONASE) 50 MCG/ACT nasal spray  Place 2 sprays into both nostrils daily. Patient taking differently: Place 2 sprays into both nostrils daily as needed for allergies or rhinitis. 11/26/21   Leath-Warren, Alda Lea, NP  ibuprofen (ADVIL) 600 MG tablet Take 1 tablet (600 mg total) by mouth every 6 (six) hours as needed. Patient taking differently: Take 800 mg by mouth every 6 (six) hours as needed for mild pain or moderate pain. 08/20/21   Jacqlyn Larsen, PA-C  levonorgestrel (MIRENA) 20 MCG/DAY IUD 1 each by Intrauterine route once.     [provider]  montelukast (SINGULAIR) 10 MG tablet Take 10 mg by mouth daily as needed (allergies). 04/17/21   [provider]  ondansetron (ZOFRAN-ODT) 4 MG disintegrating tablet 4mg  ODT q4 hours prn nausea/vomit Patient not taking: Reported on 03/02/2022 08/20/21   Jacqlyn Larsen, PA-C    Family History Family History  Problem Relation Age of Onset   Diabetes Maternal Grandmother    Hypertension Maternal Grandmother    Cancer Maternal Grandmother        breast   Hypertension Maternal Grandfather    Diabetes Maternal Grandfather    Cancer Maternal Aunt        breast    Social History Social History   Tobacco Use   Smoking status: Some Days    Packs/day: 0.25    Types: Cigarettes   Smokeless tobacco: Never  Vaping Use   Vaping Use: Former  Substance Use Topics   Alcohol use: Yes    Comment: occasional   Drug use: No    Comment: pt former cocaine user - since 2015     Allergies   Sulfa antibiotics, Sesame oil, and Shellfish allergy   Review of Systems Review of Systems Per HPI  Physical Exam Triage Vital Signs ED Triage Vitals [03/31/22 1406]  Enc Vitals Group     BP 126/84     Pulse Rate 96     Resp 18     Temp 98.1 F (36.7 C)     Temp Source Oral     SpO2 97 %     Weight      Height      Head Circumference      Peak Flow      Pain Score 7     Pain Loc      Pain Edu?      Excl. in [place]?    No data found.  Updated Vital Signs BP 126/84 (BP Location: Right Arm)   Pulse 96   Temp 98.1 F (36.7 C) (Oral)   Resp 18   SpO2 97%   Visual Acuity Right Eye Distance:   Left Eye Distance:   Bilateral Distance:    Right Eye Near:   Left Eye Near:    Bilateral Near:     Physical Exam Vitals and nursing note reviewed.  Constitutional:      Appearance: Normal appearance.  HENT:     Head: Atraumatic.     Right Ear: Tympanic membrane and external ear normal.     Left Ear: Tympanic membrane and external ear normal.     Nose:  Rhinorrhea present.     Mouth/Throat:     Mouth: Mucous membranes are moist.     Pharynx: Posterior oropharyngeal erythema present.  Eyes:     Extraocular Movements: Extraocular movements intact.     Conjunctiva/sclera: Conjunctivae normal.  Cardiovascular:     Rate and Rhythm: Normal rate and regular rhythm.     Heart  sounds: Normal heart sounds.  Pulmonary:     Effort: Pulmonary effort is normal.     Breath sounds: Normal breath sounds. No wheezing or rales.  Musculoskeletal:        General: Normal range of motion.     Cervical back: Normal range of motion and neck supple.  Skin:    General: Skin is warm and dry.  Neurological:     Mental Status: She is alert and oriented to person, place, and time.  Psychiatric:        Mood and Affect: Mood normal.        Thought Content: Thought content normal.      UC Treatments / Results  Labs (all labs ordered are listed, but only abnormal results are displayed) Labs Reviewed  RESP PANEL BY RT-PCR (FLU A&B, COVID) ARPGX2    EKG   Radiology No results found.  Procedures Procedures (including critical care time)  Medications Ordered in UC Medications - No data to display  Initial Impression / Assessment and Plan / UC Course  I have reviewed the triage vital signs and the nursing notes.  Pertinent labs & imaging results that were available during my care of the patient were reviewed by me and considered in my medical decision making (see chart for details).     Suspect viral upper respiratory infection, vitals and exam reassuring today, respiratory panel pending.  Treat with Phenergan DM, Flonase nasal spray, continue cold and congestion medications.  Return for any worsening symptoms.  Final Clinical Impressions(s) / UC Diagnoses   Final diagnoses:  Acute cough  Viral URI with cough   Discharge Instructions   None    ED Prescriptions     Medication Sig Dispense Auth. Provider   promethazine-dextromethorphan  (PROMETHAZINE-DM) 6.25-15 MG/5ML syrup Take 5 mLs by mouth 4 (four) times daily as needed. 100 mL Volney American, PA-C   fluticasone [facility]) 50 MCG/ACT nasal spray Place 1 spray into both nostrils 2 (two) times daily. 16 g Volney American, [place]      PDMP not reviewed this encounter.   Volney American, [place] 03/31/22 1443

## 2022-04-10 DIAGNOSIS — Z23 Encounter for immunization: Secondary | ICD-10-CM | POA: Diagnosis not present

## 2022-04-10 DIAGNOSIS — S81011A Laceration without foreign body, right knee, initial encounter: Secondary | ICD-10-CM | POA: Diagnosis not present

## 2022-04-10 DIAGNOSIS — S8991XA Unspecified injury of right lower leg, initial encounter: Secondary | ICD-10-CM | POA: Diagnosis not present

## 2022-04-10 DIAGNOSIS — Y9389 Activity, other specified: Secondary | ICD-10-CM | POA: Diagnosis not present

## 2022-04-10 DIAGNOSIS — X58XXXA Exposure to other specified factors, initial encounter: Secondary | ICD-10-CM | POA: Diagnosis not present

## 2022-04-10 DIAGNOSIS — F17290 Nicotine dependence, other tobacco product, uncomplicated: Secondary | ICD-10-CM | POA: Diagnosis not present

## 2022-04-10 DIAGNOSIS — Y929 Unspecified place or not applicable: Secondary | ICD-10-CM | POA: Diagnosis not present

## 2022-04-10 DIAGNOSIS — Z79899 Other long term (current) drug therapy: Secondary | ICD-10-CM | POA: Diagnosis not present

## 2022-04-15 ENCOUNTER — Ambulatory Visit (INDEPENDENT_AMBULATORY_CARE_PROVIDER_SITE_OTHER): Payer: BC Managed Care – PPO

## 2022-04-15 ENCOUNTER — Ambulatory Visit (INDEPENDENT_AMBULATORY_CARE_PROVIDER_SITE_OTHER): Payer: BC Managed Care – PPO | Admitting: Orthopedic Surgery

## 2022-04-15 DIAGNOSIS — M79671 Pain in right foot: Secondary | ICD-10-CM

## 2022-04-15 DIAGNOSIS — S93324A Dislocation of tarsometatarsal joint of right foot, initial encounter: Secondary | ICD-10-CM

## 2022-04-16 ENCOUNTER — Encounter: Payer: Self-pay | Admitting: Orthopedic Surgery

## 2022-04-16 NOTE — Progress Notes (Signed)
Office Visit Note   Patient: Renee English           Date of Birth: [birth year]           MRN: [id number] Visit Date: 04/15/2022              Requested by: No referring provider defined for this encounter. PCP: Patient, No Pcp Per  Chief Complaint  Patient presents with   Right Foot - Routine Post Op    03/03/22 right midfoot fusion    Right Knee - Pain    S/p fall laceration to knee eval at UC on 04/10/2022      HPI: Patient is a 31 year old woman who is status post midfoot fusion she is about 6 weeks out.  Patient states she recently had a fall went to urgent care and had a transverse laceration over the patella sutured at the emergency room on 04/10/2022.  Assessment & Plan: Visit Diagnoses:  1. Pain in right foot   2. Lisfranc dislocation, right, initial encounter     Plan: Patient is given a medical compression sock to wear and change daily.  Recommended washing with soap and water plan to follow-up in 1 week.  Discussed risk of infection with the closed traumatic wound.  We will follow this closely.  Follow-Up Instructions: Return in about 1 week (around 04/22/2022).   Ortho Exam  Patient is alert, oriented, no adenopathy, well-dressed, normal affect, normal respiratory effort. Examination patient has a traumatic wound that was closed in the emergency room right patella.  There is fibrinous necrotic tissue in the wound bed.  The wound is approximated.  The incision is transverse approximately 4 cm in length.  The foot is stable no pain with manipulation.  Imaging: XR Foot Complete Right  Result Date: 04/16/2022 Three-view radiographs of the right foot shows stable internal fixation for the midfoot fusion.  No complications or hardware failure  No images are attached to the encounter.  Labs: Lab Results  Component Value Date   REPTSTATUS 10/15/2018 FINAL 10/14/2018   CULT  10/14/2018    NO GROWTH Performed at [facility], [place] [address]  [place], [place] [address]    LABORGA NO GROWTH 01/10/2013     Lab Results  Component Value Date   ALBUMIN 3.9 03/03/2022   ALBUMIN 4.6 08/20/2021   ALBUMIN 4.6 08/13/2021    No results found for: "MG" No results found for: "VD25OH"  No results found for: "PREALBUMIN"    Latest Ref Rng & Units 03/03/2022    9:25 AM 08/20/2021    2:45 PM 08/13/2021    5:38 PM  CBC EXTENDED  WBC 4.0 - 10.5 K/uL 6.2  7.4  10.2   RBC 3.87 - 5.11 MIL/uL 4.47  4.48  4.48   Hemoglobin 12.0 - 15.0 g/dL 14.0  14.0  14.3   HCT 36.0 - 46.0 % 41.2  42.3  42.4   Platelets 150 - 400 K/uL 288  284  320   NEUT# 1.7 - 7.7 K/uL  5.1    Lymph# 0.7 - 4.0 K/uL  1.7       There is no height or weight on file to calculate BMI.  Orders:  Orders Placed This Encounter  Procedures   XR Foot Complete Right   No orders of the defined types were placed in this encounter.    Procedures: No procedures performed  Clinical Data: No additional findings.  ROS:  All other systems negative, except  as noted in the HPI. Review of Systems  Objective: Vital Signs: There were no vitals taken for this visit.  Specialty Comments:  No specialty comments available.  PMFS History: Patient Active Problem List   Diagnosis Date Noted   Lisfranc dislocation, right, initial encounter    Alcohol abuse 11/07/2018   MDD (major depressive disorder), single episode, severe , no psychosis ([facility]) 11/07/2018   Suicidal behavior 11/06/2018   Suicide attempt ([facility]) 11/06/2018   Contraceptive management 10/18/2013   Vaginal irritation 10/18/2013   BV (bacterial vaginosis) 01/10/2013   Postcoital bleeding 01/10/2013   Past Medical History:  Diagnosis Date   Anxiety    BV (bacterial vaginosis) 01/10/2013   Chlamydia    Contraceptive management 10/18/2013   COVID    January 2022 and February 2023 - states she was very sick both times, no hospitalization   Elevated liver enzymes 08/2021   Family history of adverse reaction to  anesthesia    Mother is hard to wake up and father has experienced post op nausea and vomiting   History of kidney stones    Postcoital bleeding 01/10/2013   Seasonal allergies    UTI (urinary tract infection)    Vaginal irritation 10/18/2013    Family History  Problem Relation Age of Onset   Diabetes Maternal Grandmother    Hypertension Maternal Grandmother    Cancer Maternal Grandmother        breast   Hypertension Maternal Grandfather    Diabetes Maternal Grandfather    Cancer Maternal Aunt        breast    Past Surgical History:  Procedure Laterality Date   FOOT ARTHRODESIS Right 03/03/2022   Procedure: MIDFOOT FUSION RIGHT FOOT;  Surgeon: Nadara Mustard, MD;  [facility];  Service: Orthopedics;  Laterality: Right;   NO PAST SURGERIES     Social History   Occupational History   Not on file  Tobacco Use   Smoking status: Some Days    Packs/day: 0.25    Types: Cigarettes   Smokeless tobacco: Never  Vaping Use   Vaping Use: Former  Substance and Sexual Activity   Alcohol use: Yes    Comment: occasional   Drug use: No    Comment: pt former cocaine user - since 2015   Sexual activity: Not Currently    Birth control/protection: Inserts

## 2022-04-22 ENCOUNTER — Ambulatory Visit (INDEPENDENT_AMBULATORY_CARE_PROVIDER_SITE_OTHER): Payer: BC Managed Care – PPO | Admitting: Orthopedic Surgery

## 2022-04-22 DIAGNOSIS — M79671 Pain in right foot: Secondary | ICD-10-CM

## 2022-04-22 DIAGNOSIS — S93324A Dislocation of tarsometatarsal joint of right foot, initial encounter: Secondary | ICD-10-CM

## 2022-04-26 ENCOUNTER — Encounter: Payer: Self-pay | Admitting: Orthopedic Surgery

## 2022-04-26 NOTE — Progress Notes (Signed)
Office Visit Note   Patient: Renee English           Date of Birth: 09-24-1990           MRN: [id number] Visit Date: 04/22/2022              Requested by: No referring provider defined for this encounter. PCP: Patient, No Pcp Per  Chief Complaint  Patient presents with   Right Foot - Routine Post Op    03/03/22 right midfoot fusion      HPI: Patient is a 31 year old woman who is 2 months status post right midfoot fusion.  Patient also sustained a laceration to her knee that was sutured in the emergency room.  Assessment & Plan: Visit Diagnoses:  1. Pain in right foot   2. Lisfranc dislocation, right, initial encounter     Plan: Sutures are harvested from the knee.  Patient will increase her activities as tolerated.  Three-view radiographs of the right foot at follow-up.  Follow-Up Instructions: No follow-ups on file.   Ortho Exam  Patient is alert, oriented, no adenopathy, well-dressed, normal affect, normal respiratory effort. Examination the knee laceration is healing we will harvest the sutures.  The right foot is showing good range of motion no redness or cellulitis.  Imaging: No results found. No images are attached to the encounter.  Labs: Lab Results  Component Value Date   REPTSTATUS 10/15/2018 FINAL 10/14/2018   CULT  10/14/2018    NO GROWTH Performed at [facility], [address] [place], [place] [address]    LABORGA NO GROWTH 01/10/2013     Lab Results  Component Value Date   ALBUMIN 3.9 03/03/2022   ALBUMIN 4.6 08/20/2021   ALBUMIN 4.6 08/13/2021    No results found for: "MG" No results found for: "VD25OH"  No results found for: "PREALBUMIN"    Latest Ref Rng & Units 03/03/2022    9:25 AM 08/20/2021    2:45 PM 08/13/2021    5:38 PM  CBC EXTENDED  WBC 4.0 - 10.5 K/uL 6.2  7.4  10.2   RBC 3.87 - 5.11 MIL/uL 4.47  4.48  4.48   Hemoglobin 12.0 - 15.0 g/dL 45.8  09.9  83.3   HCT 36.0 - 46.0 % 41.2  42.3  42.4   Platelets 150 -  400 K/uL 288  284  320   NEUT# 1.7 - 7.7 K/uL  5.1    Lymph# 0.7 - 4.0 K/uL  1.7       There is no height or weight on file to calculate BMI.  Orders:  No orders of the defined types were placed in this encounter.  No orders of the defined types were placed in this encounter.    Procedures: No procedures performed  Clinical Data: No additional findings.  ROS:  All other systems negative, except as noted in the HPI. Review of Systems  Objective: Vital Signs: There were no vitals taken for this visit.  Specialty Comments:  No specialty comments available.  PMFS History: Patient Active Problem List   Diagnosis Date Noted   Lisfranc dislocation, right, initial encounter    Alcohol abuse 11/07/2018   MDD (major depressive disorder), single episode, severe , no psychosis ([facility]) 11/07/2018   Suicidal behavior 11/06/2018   Suicide attempt ([facility]) 11/06/2018   Contraceptive management 10/18/2013   Vaginal irritation 10/18/2013   BV (bacterial vaginosis) 01/10/2013   Postcoital bleeding 01/10/2013   Past Medical History:  Diagnosis Date  Anxiety    BV (bacterial vaginosis) 01/10/2013   Chlamydia    Contraceptive management 10/18/2013   COVID    January 2022 and February 2023 - states she was very sick both times, no hospitalization   Elevated liver enzymes 08/2021   Family history of adverse reaction to anesthesia    Mother is hard to wake up and father has experienced post op nausea and vomiting   History of kidney stones    Postcoital bleeding 01/10/2013   Seasonal allergies    UTI (urinary tract infection)    Vaginal irritation 10/18/2013    Family History  Problem Relation Age of Onset   Diabetes Maternal Grandmother    Hypertension Maternal Grandmother    Cancer Maternal Grandmother        breast   Hypertension Maternal Grandfather    Diabetes Maternal Grandfather    Cancer Maternal Aunt        breast    Past Surgical History:  Procedure Laterality  Date   FOOT ARTHRODESIS Right 03/03/2022   Procedure: MIDFOOT FUSION RIGHT FOOT;  Surgeon: Newt Minion, MD;  Location: [place];  Service: Orthopedics;  Laterality: Right;   NO PAST SURGERIES     Social History   Occupational History   Not on file  Tobacco Use   Smoking status: Some Days    Packs/day: 0.25    Types: Cigarettes   Smokeless tobacco: Never  Vaping Use   Vaping Use: Former  Substance and Sexual Activity   Alcohol use: Yes    Comment: occasional   Drug use: No    Comment: pt former cocaine user - since 2015   Sexual activity: Not Currently    Birth control/protection: Inserts

## 2022-05-20 ENCOUNTER — Encounter: Payer: BC Managed Care – PPO | Admitting: Orthopedic Surgery

## 2022-05-31 ENCOUNTER — Ambulatory Visit (INDEPENDENT_AMBULATORY_CARE_PROVIDER_SITE_OTHER): Payer: BC Managed Care – PPO

## 2022-05-31 ENCOUNTER — Ambulatory Visit (INDEPENDENT_AMBULATORY_CARE_PROVIDER_SITE_OTHER): Payer: BC Managed Care – PPO | Admitting: Orthopedic Surgery

## 2022-05-31 DIAGNOSIS — M79671 Pain in right foot: Secondary | ICD-10-CM

## 2022-05-31 DIAGNOSIS — S93324A Dislocation of tarsometatarsal joint of right foot, initial encounter: Secondary | ICD-10-CM

## 2022-06-06 ENCOUNTER — Encounter: Payer: Self-pay | Admitting: Orthopedic Surgery

## 2022-06-06 NOTE — Progress Notes (Signed)
Office Visit Note   Patient: Renee English           Date of Birth: Aug 02, 1990           MRN: [id number] Visit Date: 05/31/2022              Requested by: No referring provider defined for this encounter. PCP: Patient, No Pcp Per  Chief Complaint  Patient presents with   Right Foot - Routine Post Op    03/03/2022 midfoot fusion right foot      HPI: Patient is a 31 year old woman who is 3 months status post right midfoot fusion.  She is currently wearing a knee-high compression sock.  She states she still has swelling and pain anteriorly over the ankle.  Assessment & Plan: Visit Diagnoses:  1. Pain in right foot     Plan: Continue with ankle range of motion and fascial strengthening.  Recommended scar massage.  Increase activities as tolerated without restrictions.  Follow-Up Instructions: No follow-ups on file.   Ortho Exam  Patient is alert, oriented, no adenopathy, well-dressed, normal affect, normal respiratory effort. Examination patient has good dorsiflexion of the ankle.  She is developing some thickening to the scar and recommended scar massage.  She will work on [employer] and continue with her compression sock.  Imaging: No results found. No images are attached to the encounter.  Labs: Lab Results  Component Value Date   REPTSTATUS 10/15/2018 FINAL 10/14/2018   CULT  10/14/2018    NO GROWTH Performed at [facility], [address], [place], [place] [address]    LABORGA NO GROWTH 01/10/2013     Lab Results  Component Value Date   ALBUMIN 3.9 03/03/2022   ALBUMIN 4.6 08/20/2021   ALBUMIN 4.6 08/13/2021    No results found for: "MG" No results found for: "VD25OH"  No results found for: "PREALBUMIN"    Latest Ref Rng & Units 03/03/2022    9:25 AM 08/20/2021    2:45 PM 08/13/2021    5:38 PM  CBC EXTENDED  WBC 4.0 - 10.5 K/uL 6.2  7.4  10.2   RBC 3.87 - 5.11 MIL/uL 4.47  4.48  4.48   Hemoglobin 12.0 - 15.0 g/dL 35.3  29.9   24.2   HCT 36.0 - 46.0 % 41.2  42.3  42.4   Platelets 150 - 400 K/uL 288  284  320   NEUT# 1.7 - 7.7 K/uL  5.1    Lymph# 0.7 - 4.0 K/uL  1.7       There is no height or weight on file to calculate BMI.  Orders:  Orders Placed This Encounter  Procedures   XR Foot Complete Right   No orders of the defined types were placed in this encounter.    Procedures: No procedures performed  Clinical Data: No additional findings.  ROS:  All other systems negative, except as noted in the HPI. Review of Systems  Objective: Vital Signs: There were no vitals taken for this visit.  Specialty Comments:  No specialty comments available.  PMFS History: Patient Active Problem List   Diagnosis Date Noted   Lisfranc dislocation, right, initial encounter    Alcohol abuse 11/07/2018   MDD (major depressive disorder), single episode, severe , no psychosis ([facility]) 11/07/2018   Suicidal behavior 11/06/2018   Suicide attempt ([facility]) 11/06/2018   Contraceptive management 10/18/2013   Vaginal irritation 10/18/2013   BV (bacterial vaginosis) 01/10/2013   Postcoital bleeding 01/10/2013  Past Medical History:  Diagnosis Date   Anxiety    BV (bacterial vaginosis) 01/10/2013   Chlamydia    Contraceptive management 10/18/2013   COVID    January 2022 and February 2023 - states she was very sick both times, no hospitalization   Elevated liver enzymes 08/2021   Family history of adverse reaction to anesthesia    Mother is hard to wake up and father has experienced post op nausea and vomiting   History of kidney stones    Postcoital bleeding 01/10/2013   Seasonal allergies    UTI (urinary tract infection)    Vaginal irritation 10/18/2013    Family History  Problem Relation Age of Onset   Diabetes Maternal Grandmother    Hypertension Maternal Grandmother    Cancer Maternal Grandmother        breast   Hypertension Maternal Grandfather    Diabetes Maternal Grandfather    Cancer Maternal Aunt         breast    Past Surgical History:  Procedure Laterality Date   FOOT ARTHRODESIS Right 03/03/2022   Procedure: MIDFOOT FUSION RIGHT FOOT;  Surgeon: Nadara Mustard, MD;  Location: [facility];  Service: Orthopedics;  Laterality: Right;   NO PAST SURGERIES     Social History   Occupational History   Not on file  Tobacco Use   Smoking status: Some Days    Packs/day: 0.25    Types: Cigarettes   Smokeless tobacco: Never  Vaping Use   Vaping Use: Former  Substance and Sexual Activity   Alcohol use: Yes    Comment: occasional   Drug use: No    Comment: pt former cocaine user - since 2015   Sexual activity: Not Currently    Birth control/protection: Inserts

## 2022-06-14 DIAGNOSIS — Z9189 Other specified personal risk factors, not elsewhere classified: Secondary | ICD-10-CM | POA: Diagnosis not present

## 2022-06-14 DIAGNOSIS — F419 Anxiety disorder, unspecified: Secondary | ICD-10-CM | POA: Diagnosis not present

## 2022-06-14 DIAGNOSIS — E782 Mixed hyperlipidemia: Secondary | ICD-10-CM | POA: Diagnosis not present

## 2022-06-14 DIAGNOSIS — E669 Obesity, unspecified: Secondary | ICD-10-CM | POA: Diagnosis not present

## 2022-07-12 DIAGNOSIS — Z6834 Body mass index (BMI) 34.0-34.9, adult: Secondary | ICD-10-CM | POA: Diagnosis not present

## 2022-07-12 DIAGNOSIS — J209 Acute bronchitis, unspecified: Secondary | ICD-10-CM | POA: Diagnosis not present

## 2022-07-12 DIAGNOSIS — I10 Essential (primary) hypertension: Secondary | ICD-10-CM | POA: Diagnosis not present

## 2022-07-12 DIAGNOSIS — E669 Obesity, unspecified: Secondary | ICD-10-CM | POA: Diagnosis not present

## 2022-07-26 DIAGNOSIS — E782 Mixed hyperlipidemia: Secondary | ICD-10-CM | POA: Diagnosis not present

## 2022-07-26 DIAGNOSIS — F419 Anxiety disorder, unspecified: Secondary | ICD-10-CM | POA: Diagnosis not present

## 2022-07-26 DIAGNOSIS — Z9189 Other specified personal risk factors, not elsewhere classified: Secondary | ICD-10-CM | POA: Diagnosis not present

## 2022-08-11 DIAGNOSIS — E782 Mixed hyperlipidemia: Secondary | ICD-10-CM | POA: Diagnosis not present

## 2022-08-11 DIAGNOSIS — F419 Anxiety disorder, unspecified: Secondary | ICD-10-CM | POA: Diagnosis not present

## 2022-08-11 DIAGNOSIS — Z9189 Other specified personal risk factors, not elsewhere classified: Secondary | ICD-10-CM | POA: Diagnosis not present

## 2022-09-02 ENCOUNTER — Encounter: Payer: Self-pay | Admitting: Radiology

## 2022-09-02 DIAGNOSIS — Z6835 Body mass index (BMI) 35.0-35.9, adult: Secondary | ICD-10-CM | POA: Diagnosis not present

## 2022-09-02 DIAGNOSIS — G43909 Migraine, unspecified, not intractable, without status migrainosus: Secondary | ICD-10-CM | POA: Diagnosis not present

## 2022-09-02 DIAGNOSIS — F419 Anxiety disorder, unspecified: Secondary | ICD-10-CM | POA: Diagnosis not present

## 2022-09-08 DIAGNOSIS — R030 Elevated blood-pressure reading, without diagnosis of hypertension: Secondary | ICD-10-CM | POA: Diagnosis not present

## 2022-09-08 DIAGNOSIS — N760 Acute vaginitis: Secondary | ICD-10-CM | POA: Diagnosis not present

## 2022-09-08 DIAGNOSIS — J020 Streptococcal pharyngitis: Secondary | ICD-10-CM | POA: Diagnosis not present

## 2022-09-23 ENCOUNTER — Telehealth: Payer: BC Managed Care – PPO | Admitting: Family Medicine

## 2022-09-23 DIAGNOSIS — J020 Streptococcal pharyngitis: Secondary | ICD-10-CM | POA: Diagnosis not present

## 2022-09-23 MED ORDER — PREDNISONE 10 MG (21) PO TBPK: ORAL_TABLET | ORAL | 0 refills | Status: DC

## 2022-09-23 MED ORDER — CEPHALEXIN 500 MG PO CAPS: 500.0000 mg | ORAL_CAPSULE | Freq: Two times a day (BID) | ORAL | 0 refills | Status: AC

## 2022-09-23 NOTE — Patient Instructions (Signed)
Renee English, thank you for joining Chanda Busing, PA-C for today's virtual visit.  While this provider is not your primary care provider (PCP), if your PCP is located in our provider database this encounter information will be shared with them immediately following your visit.   [facility] account gives you access to today's visit and all your visits, tests, and labs performed at [facility] " click here if you don't have a [facility] account or go to [email or web address]  Consent: (Patient) Renee English provided verbal consent for this virtual visit at the beginning of the encounter.  Current Medications:  Current Outpatient Medications:    cephALEXin (KEFLEX) 500 MG capsule, Take 1 capsule (500 mg total) by mouth 2 (two) times daily for 10 days., Disp: 20 capsule, Rfl: 0   predniSONE (STERAPRED UNI-PAK 21 TAB) 10 MG (21) TBPK tablet, Take following package directions., Disp: 21 tablet, Rfl: 0   albuterol (VENTOLIN HFA) 108 (90 Base) MCG/ACT inhaler, Inhale 2 puffs into the lungs every 6 (six) hours as needed for wheezing or shortness of breath., Disp: 8 g, Rfl: 0   b complex vitamins capsule, Take 1 capsule by mouth every other day., Disp: , Rfl:    buPROPion (WELLBUTRIN XL) 150 MG 24 hr tablet, Take 150 mg by mouth daily., Disp: , Rfl:    ciprofloxacin-dexamethasone (CIPRODEX) OTIC suspension, Place 4 drops into the left ear 2 (two) times daily. (Patient not taking: Reported on 03/02/2022), Disp: 7.5 mL, Rfl: 0   ergocalciferol (VITAMIN D2) 1.25 MG (50000 UT) capsule, Take 50,000 Units by mouth once a week., Disp: , Rfl:    fluticasone (FLONASE) 50 MCG/ACT nasal spray, Place 2 sprays into both nostrils daily. (Patient taking differently: Place 2 sprays into both nostrils daily as needed for allergies or rhinitis.), Disp: 16 g, Rfl: 0   fluticasone (FLONASE) 50 MCG/ACT nasal spray, Place 1 spray into both nostrils 2 (two) times daily., Disp: 16 g,  Rfl: 2   ibuprofen (ADVIL) 600 MG tablet, Take 1 tablet (600 mg total) by mouth every 6 (six) hours as needed. (Patient taking differently: Take 800 mg by mouth every 6 (six) hours as needed for mild pain or moderate pain.), Disp: 30 tablet, Rfl: 0   levonorgestrel (MIRENA) 20 MCG/DAY IUD, 1 each by Intrauterine route once., Disp: , Rfl:    montelukast (SINGULAIR) 10 MG tablet, Take 10 mg by mouth daily as needed (allergies)., Disp: , Rfl:    ondansetron (ZOFRAN-ODT) 4 MG disintegrating tablet, 4mg  ODT q4 hours prn nausea/vomit (Patient not taking: Reported on 03/02/2022), Disp: 10 tablet, Rfl: 0   oxyCODONE-acetaminophen (PERCOCET/ROXICET) 5-325 MG tablet, Take 1 tablet by mouth every 4 (four) hours as needed., Disp: 30 tablet, Rfl: 0   promethazine-dextromethorphan (PROMETHAZINE-DM) 6.25-15 MG/5ML syrup, Take 5 mLs by mouth 4 (four) times daily as needed., Disp: 100 mL, Rfl: 0   traZODone (DESYREL) 50 MG tablet, Take 75 mg by mouth at bedtime., Disp: , Rfl:    Medications ordered in this encounter:  Meds ordered this encounter  Medications   cephALEXin (KEFLEX) 500 MG capsule    Sig: Take 1 capsule (500 mg total) by mouth 2 (two) times daily for 10 days.    Dispense:  20 capsule    Refill:  0   predniSONE (STERAPRED UNI-PAK 21 TAB) 10 MG (21) TBPK tablet    Sig: Take following package directions.    Dispense:  21 tablet    Refill:  0  Please dispense one standard blister pack taper.     *If you need refills on other medications prior to your next appointment, please contact your pharmacy*  Follow-Up: Call back or seek an in-person evaluation if the symptoms worsen or if the condition fails to improve as anticipated.  [place] [phone]  Other Instructions    If you have been instructed to have an in-person evaluation today at a local Urgent Care facility, please use the link below. It will take you to a list of all of our available [facility],  including address, phone number and hours of operation. Please do not delay care.  [facility]  If you or a family member do not have a primary care provider, use the link below to schedule a visit and establish care. When you choose a [facility] primary care physician or advanced practice provider, you gain a long-term partner in health. Find a Primary Care Provider  Learn more about [facility]'s in-office and virtual care options: [facility] Strep Throat, Adult Strep throat is an infection of the throat. It is caused by germs (bacteria). Strep throat is common during the cold months of the year. It mostly affects children who are 39-79 years old. However, people of all ages can get it at any time of the year. This infection spreads from person to person through coughing, sneezing, or having close contact. What are the causes? This condition is caused by the Streptococcus pyogenes germ. What increases the risk? You care for young children. Children are more likely to get strep throat and may spread it to others. You go to crowded places. Germs can spread easily in such places. You kiss or touch someone who has strep throat. What are the signs or symptoms? Fever or chills. Redness, swelling, or pain in the tonsils or throat. Pain or trouble when swallowing. White or yellow spots on the tonsils or throat. Tender glands in the neck and under the jaw. Bad breath. Red rash all over the body. This is rare. How is this treated? Medicines that kill germs (antibiotics). Medicines that treat pain or fever. These include: Ibuprofen or acetaminophen. Aspirin, only for people who are over the age of 31. Cough drops. Throat sprays. Follow these instructions at home: Medicines  Take over-the-counter and prescription medicines only as told by your doctor. Take your antibiotic medicine as told by your doctor. Do not stop taking the antibiotic even if you start to feel  better. Eating and drinking  If you have trouble swallowing, eat soft foods until your throat feels better. Drink enough fluid to keep your pee (urine) pale yellow. To help with pain, you may have: Warm fluids, such as soup and tea. Cold fluids, such as frozen desserts or popsicles. General instructions Rinse your mouth (gargle) with a salt-water mixture 3-4 times a day or as needed. To make a salt-water mixture, dissolve -1 tsp (3-6 g) of salt in 1 cup (237 mL) of warm water. Rest as much as you can. Stay home from work or school until you have been taking antibiotics for 24 hours. Do not smoke or use any products that contain nicotine or tobacco. If you need help quitting, ask your doctor. Keep all follow-up visits. How is this prevented?  Do not share food, drinking cups, or personal items. They can cause the germs to spread. Wash your hands well with soap and water. Make sure that all  people in your house wash their hands well. Have family members tested if they have a fever or a sore throat. They may need an antibiotic if they have strep throat. Contact a doctor if: You have swelling in your neck that keeps getting bigger. You get a rash, cough, or earache. You cough up a thick fluid that is green, yellow-brown, or bloody. You have pain that does not get better with medicine. Your symptoms get worse instead of getting better. You have a fever. Get help right away if: You vomit. You have a very bad headache. Your neck hurts or feels stiff. You have chest pain or are short of breath. You have drooling, very bad throat pain, or changes in your voice. Your neck is swollen, or the skin gets red and tender. Your mouth is dry, or you are peeing less than normal. You keep feeling more tired or have trouble waking up. Your joints are red or painful. These symptoms may be an emergency. Do not wait to see if the symptoms will go away. Get help right away. Call your local emergency  services (911 in the [place] Summary Strep throat is an infection of the throat. It is caused by germs (bacteria). This infection can spread from person to person through coughing, sneezing, or having close contact. Take your medicines, including antibiotics, as told by your doctor. Do not stop taking the antibiotic even if you start to feel better. To prevent the spread of germs, wash your hands well with soap and water. Have others do the same. Do not share food, drinking cups, or personal items. Get help right away if you have a bad headache, chest pain, shortness of breath, a stiff or painful neck, or you vomit. This information is not intended to replace advice given to you by your health care provider. Make sure you discuss any questions you have with your health care provider. Document Revised: 10/14/2020 Document Reviewed: 10/14/2020 Elsevier Patient Education  [place].

## 2022-09-23 NOTE — Progress Notes (Signed)
Virtual Visit Consent   Renee English, you are scheduled for a virtual visit with a [facility] provider today. Just as with appointments in the office, your consent must be obtained to participate. Your consent will be active for this visit and any virtual visit you may have with one of our providers in the next 365 days. If you have a MyChart account, a copy of this consent can be sent to you electronically.  As this is a virtual visit, video technology does not allow for your provider to perform a traditional examination. This may limit your provider's ability to fully assess your condition. If your provider identifies any concerns that need to be evaluated in person or the need to arrange testing (such as labs, EKG, etc.), we will make arrangements to do so. Although advances in technology are sophisticated, we cannot ensure that it will always work on either your end or our end. If the connection with a video visit is poor, the visit may have to be switched to a telephone visit. With either a video or telephone visit, we are not always able to ensure that we have a secure connection.  By engaging in this virtual visit, you consent to the provision of healthcare and authorize for your insurance to be billed (if applicable) for the services provided during this visit. Depending on your insurance coverage, you may receive a charge related to this service.  I need to obtain your verbal consent now. Are you willing to proceed with your visit today? Renee English has provided verbal consent on 09/23/2022 for a virtual visit (video or telephone). Chanda Busing, [place]  Date: 09/23/2022 7:11 PM  Virtual Visit via Video Note   I, Chanda Busing, connected with  Renee English  ([id number], [birth year]) on 09/23/22 at  6:45 PM EDT by a video-enabled telemedicine application and verified that I am speaking with the correct person using two identifiers.  Location: Patient: Virtual Visit Location Patient:  Home Provider: Virtual Visit Location Provider: Home Office   I discussed the limitations of evaluation and management by telemedicine and the availability of in person appointments. The patient expressed understanding and agreed to proceed.    History of Present Illness: Renee English is a 32 y.o. who identifies as a female who was assigned female at birth, and is being seen today for complaint of "My throat is extremely raw" -Pt states she has white patches on both sides of throat  -Pt states she had strep throat on March 6th and was placed on antibiotics  -Pt states she was on Amoxicillin antibiotics for 10 days  -Pt denies fever, chills but reports pain with swallowing and difficulty swallowing  -Pt states tonsil are very swallowing, state right side is more swollen than the left  -Pt states has a history of Strep throat at least twice a year  -Pt states is seeing ENT in April   HPI: HPI  Problems:  Patient Active Problem List   Diagnosis Date Noted   Lisfranc dislocation, right, initial encounter    Alcohol abuse 11/07/2018   MDD (major depressive disorder), single episode, severe , no psychosis ([place]) 11/07/2018   Suicidal behavior 11/06/2018   Suicide attempt ([place]) 11/06/2018   Contraceptive management 10/18/2013   Vaginal irritation 10/18/2013   BV (bacterial vaginosis) 01/10/2013   Postcoital bleeding 01/10/2013    Allergies:  Allergies  Allergen Reactions   Sulfa Antibiotics Anaphylaxis, Hives and Swelling   Sesame Oil Hives and Swelling  Shellfish Allergy Hives and Swelling   Medications:  Current Outpatient Medications:    albuterol (VENTOLIN HFA) 108 (90 Base) MCG/ACT inhaler, Inhale 2 puffs into the lungs every 6 (six) hours as needed for wheezing or shortness of breath., Disp: 8 g, Rfl: 0   b complex vitamins capsule, Take 1 capsule by mouth every other day., Disp: , Rfl:    buPROPion (WELLBUTRIN XL) 150 MG 24 hr tablet, Take 150 mg by mouth daily., Disp: ,  Rfl:    ciprofloxacin-dexamethasone (CIPRODEX) OTIC suspension, Place 4 drops into the left ear 2 (two) times daily. (Patient not taking: Reported on 03/02/2022), Disp: 7.5 mL, Rfl: 0   ergocalciferol (VITAMIN D2) 1.25 MG (50000 UT) capsule, Take 50,000 Units by mouth once a week., Disp: , Rfl:    fluticasone (FLONASE) 50 MCG/ACT nasal spray, Place 2 sprays into both nostrils daily. (Patient taking differently: Place 2 sprays into both nostrils daily as needed for allergies or rhinitis.), Disp: 16 g, Rfl: 0   fluticasone (FLONASE) 50 MCG/ACT nasal spray, Place 1 spray into both nostrils 2 (two) times daily., Disp: 16 g, Rfl: 2   ibuprofen (ADVIL) 600 MG tablet, Take 1 tablet (600 mg total) by mouth every 6 (six) hours as needed. (Patient taking differently: Take 800 mg by mouth every 6 (six) hours as needed for mild pain or moderate pain.), Disp: 30 tablet, Rfl: 0   levonorgestrel (MIRENA) 20 MCG/DAY IUD, 1 each by Intrauterine route once., Disp: , Rfl:    montelukast (SINGULAIR) 10 MG tablet, Take 10 mg by mouth daily as needed (allergies)., Disp: , Rfl:    ondansetron (ZOFRAN-ODT) 4 MG disintegrating tablet, 4mg  ODT q4 hours prn nausea/vomit (Patient not taking: Reported on 03/02/2022), Disp: 10 tablet, Rfl: 0   oxyCODONE-acetaminophen (PERCOCET/ROXICET) 5-325 MG tablet, Take 1 tablet by mouth every 4 (four) hours as needed., Disp: 30 tablet, Rfl: 0   promethazine-dextromethorphan (PROMETHAZINE-DM) 6.25-15 MG/5ML syrup, Take 5 mLs by mouth 4 (four) times daily as needed., Disp: 100 mL, Rfl: 0   traZODone (DESYREL) 50 MG tablet, Take 75 mg by mouth at bedtime., Disp: , Rfl:   Observations/Objective: Patient is well-developed, well-nourished in no acute distress.  Resting comfortably at home.  Head is normocephalic, atraumatic.  No labored breathing.  Right sided tonsillar swelling with erythema and exudate.  Speech is clear and coherent with logical content.  Patient is alert and oriented at  baseline.   Assessment and Plan: 1. Strep throat -Pt noted with right sided tonsillar swelling and exudate -Start Keflex and Prednisone taper -Advised Pt if no improvement in 1-2 days to head to in person urgent care or emergency room for further evaluation.   Follow Up Instructions: I discussed the assessment and treatment plan with the patient. The patient was provided an opportunity to ask questions and all were answered. The patient agreed with the plan and demonstrated an understanding of the instructions.  A copy of instructions were sent to the patient via MyChart unless otherwise noted below.     The patient was advised to call back or seek an in-person evaluation if the symptoms worsen or if the condition fails to improve as anticipated.  Time:  I spent 15 minutes with the patient via telehealth technology discussing the above problems/concerns.    Chanda Busing, PA-C

## 2022-09-28 DIAGNOSIS — F419 Anxiety disorder, unspecified: Secondary | ICD-10-CM | POA: Diagnosis not present

## 2022-09-28 DIAGNOSIS — G43909 Migraine, unspecified, not intractable, without status migrainosus: Secondary | ICD-10-CM | POA: Diagnosis not present

## 2022-09-28 DIAGNOSIS — Z9189 Other specified personal risk factors, not elsewhere classified: Secondary | ICD-10-CM | POA: Diagnosis not present

## 2022-10-14 DIAGNOSIS — J309 Allergic rhinitis, unspecified: Secondary | ICD-10-CM | POA: Diagnosis not present

## 2022-10-14 DIAGNOSIS — Z8709 Personal history of other diseases of the respiratory system: Secondary | ICD-10-CM | POA: Diagnosis not present

## 2022-10-26 DIAGNOSIS — F419 Anxiety disorder, unspecified: Secondary | ICD-10-CM | POA: Diagnosis not present

## 2022-10-26 DIAGNOSIS — G43909 Migraine, unspecified, not intractable, without status migrainosus: Secondary | ICD-10-CM | POA: Diagnosis not present

## 2022-10-28 DIAGNOSIS — J3501 Chronic tonsillitis: Secondary | ICD-10-CM | POA: Diagnosis not present

## 2022-11-26 ENCOUNTER — Ambulatory Visit: Payer: Self-pay | Admitting: Allergy & Immunology

## 2022-12-03 DIAGNOSIS — J3501 Chronic tonsillitis: Secondary | ICD-10-CM | POA: Diagnosis not present

## 2022-12-03 HISTORY — PX: TONSILLECTOMY: SUR1361

## 2022-12-30 DIAGNOSIS — J3501 Chronic tonsillitis: Secondary | ICD-10-CM | POA: Diagnosis not present

## 2023-01-04 DIAGNOSIS — G43909 Migraine, unspecified, not intractable, without status migrainosus: Secondary | ICD-10-CM | POA: Diagnosis not present

## 2023-01-04 DIAGNOSIS — E669 Obesity, unspecified: Secondary | ICD-10-CM | POA: Diagnosis not present

## 2023-01-04 DIAGNOSIS — F419 Anxiety disorder, unspecified: Secondary | ICD-10-CM | POA: Diagnosis not present

## 2023-01-21 ENCOUNTER — Ambulatory Visit: Payer: BC Managed Care – PPO | Admitting: Allergy & Immunology

## 2023-01-21 ENCOUNTER — Other Ambulatory Visit: Payer: Self-pay

## 2023-01-21 ENCOUNTER — Encounter: Payer: Self-pay | Admitting: Allergy & Immunology

## 2023-01-21 VITALS — BP 122/82 | HR 105 | Temp 98.8°F | Resp 16 | Ht 62.6 in | Wt 172.4 lb

## 2023-01-21 DIAGNOSIS — J4530 Mild persistent asthma, uncomplicated: Secondary | ICD-10-CM | POA: Diagnosis not present

## 2023-01-21 DIAGNOSIS — J302 Other seasonal allergic rhinitis: Secondary | ICD-10-CM | POA: Diagnosis not present

## 2023-01-21 DIAGNOSIS — J3089 Other allergic rhinitis: Secondary | ICD-10-CM | POA: Diagnosis not present

## 2023-01-21 DIAGNOSIS — J310 Chronic rhinitis: Secondary | ICD-10-CM

## 2023-01-21 DIAGNOSIS — L500 Allergic urticaria: Secondary | ICD-10-CM | POA: Diagnosis not present

## 2023-01-21 DIAGNOSIS — T7800XD Anaphylactic reaction due to unspecified food, subsequent encounter: Secondary | ICD-10-CM

## 2023-01-21 MED ORDER — ARNUITY ELLIPTA 200 MCG/ACT IN AEPB: 1.0000 | INHALATION_SPRAY | Freq: Every day | RESPIRATORY_TRACT | 5 refills | Status: DC

## 2023-01-21 NOTE — Patient Instructions (Addendum)
1. Mild persistent asthma, uncomplicated - Lung testing looked great today. - I do want to start a daily controller medication to see if this can help with decreasing the use of your albuterol. - Daily controller medication(s): Arnuity one puff once daily - Prior to physical activity: albuterol 2 puffs 10-15 minutes before physical activity. - Rescue medications: albuterol 4 puffs every 4-6 hours as needed - Changes during respiratory infections or worsening symptoms: Increase Arnuity to 1 puff twice daily for TWO WEEKS. - Asthma control goals:  * Full participation in all desired activities (may need albuterol before activity) * Albuterol use two time or less a week on average (not counting use with activity) * Cough interfering with sleep two time or less a month * Oral steroids no more than once a year * No hospitalizations  2. Chronic rhinitis - Testing today showed: grasses, ragweed, weeds, trees, indoor molds, outdoor molds, dust mites, cat, dog, cockroach, and mixed feather, horse, tobacco - Copy of test results provided.  - Avoidance measures provided. - Stop taking:  - Continue with: Singulair (montelukast) 10mg  daily - Start taking: Ryaltris (olopatadine/mometasone) two sprays per nostril 1-2 times daily as needed - You can use an extra dose of the antihistamine, if needed, for breakthrough symptoms.  - Consider nasal saline rinses 1-2 times daily to remove allergens from the nasal cavities as well as help with mucous clearance (this is especially helpful to do before the nasal sprays are given) - Consider allergy shots as a means of long-term control. - Allergy shots "re-train" and "reset" the immune system to ignore environmental allergens and decrease the resulting immune response to those allergens (sneezing, itchy watery eyes, runny nose, nasal congestion, etc).    - Allergy shots improve symptoms in 75-85% of patients.  - We can discuss more at the next  appointment if the medications are not working for you.  3. 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  - EpiPen training reviewed. - Emergency Action Plan provided.   4. Return in about 2 months (around 03/24/2023). You can have the follow up appointment with Dr. Dellis Anes or a Nurse Practicioner (our Nurse Practitioners are excellent and always have Physician oversight!).    Please inform us of any Emergency Department visits, hospitalizations, or changes in symptoms. Call us before going to the ED for breathing or allergy symptoms since we might be able to fit you in for a sick visit. Feel free to contact us anytime with any questions, problems, or concerns.  It was a pleasure to meet you today!  Websites that have reliable patient information: 1. American Academy of Asthma, Allergy, and Immunology: www.aaaai.org 2. Food Allergy Research and Education (FARE): foodallergy.org 3. Mothers of Asthmatics: http://www.asthmacommunitynetwork.org 4. American College of Allergy, Asthma, and Immunology: www.acaai.org   COVID-19 Vaccine Information can be found at: [email or web address] For questions related to vaccine distribution or appointments, please email [email or web address] or call [phone].   We realize that you might be concerned about having an allergic reaction to the COVID19 vaccines. To help with that concern, WE ARE OFFERING THE COVID19 VACCINES IN OUR OFFICE! Ask the front desk for dates!     "Like" [place] on Facebook and Instagram for our latest updates!      A healthy democracy works best when [employer] participate! Make sure you are registered to vote! If you have moved or changed any of your contact information, you will need to get this updated  before voting!  In some cases, you MAY be able to register to vote online:  [email or web address]      Airborne Adult Perc - 01/21/23 1534     Time Antigen Placed 1515    Allergen Manufacturer Waynette Buttery    Location Back    Number of Test 55    1. Control-Buffer 50% Glycerol Negative    2. Control-Histamine 2+    3. Bahia Negative    4. [place] Negative    5. Johnson Negative    6. Kentucky Blue 3+    7. Meadow Fescue 3+    8. Perennial Rye 2+    9. Timothy 3+    10. Ragweed Mix 3+    11. Cocklebur 2+    12. Plantain,  English Negative    13. Baccharis 3+    14. Dog Fennel 3+    15. Guernsey Thistle 2+    16. Lamb's Quarters 3+    17. Sheep Sorrell 3+    18. Rough Pigweed Negative    19. Marsh Elder, Rough 3+    20. Mugwort, Common 2+    21. Box, Elder 3+    22. Cedar, red 2+    23. Sweet Gum 3+    24. Pecan Pollen 3+    25. Pine Mix 3+    26. Walnut, Black Pollen 3+    27. Red Mulberry 2+    28. Ash Mix 2+    29. Birch Mix Negative    30. Beech American Negative    31. Cottonwood, [place] 2+    32. Hickory, White 3+    33. Maple Mix 3+    34. Oak, [place] Mix 2+    35. Sycamore Eastern 2+    36. Alternaria Alternata 2+    37. Cladosporium Herbarum 2+    38. Aspergillus Mix 2+    39. Penicillium Mix 2+    40. Bipolaris Sorokiniana (Helminthosporium) 2+    41. Drechslera Spicifera (Curvularia) 2+    42. Mucor Plumbeus 3+    43. Fusarium Moniliforme Negative    44. Aureobasidium Pullulans (pullulara) 3+    45. Rhizopus Oryzae Negative    46. Botrytis Cinera 2+    47. Epicoccum Nigrum 3+    48. Phoma Betae 3+    49. Dust Mite Mix 3+    50. Cat Hair 10,000 BAU/ml 3+    51.  Dog Epithelia 2+    52. Mixed Feathers 2+    53. Horse Epithelia 2+    54. Cockroach, German 2+    55. Tobacco Leaf 3+             Food Adult Perc - 01/21/23 1500     Time Antigen Placed 1515    Allergen Manufacturer Waynette Buttery    Location Back    Number of allergen test 6    8. Shellfish Mix --   6 x 8   23. Shrimp --   2 x 4    24. Crab Negative    25. Lobster --   4 x 7   26. Oyster Negative    Comments SCALLOP: 3 x 5             Reducing Pollen Exposure  The American Academy of Allergy, Asthma and Immunology suggests the following steps to reduce your exposure to pollen during allergy seasons.    Do not hang sheets or clothing out to dry; pollen may collect on these items. Do not mow lawns  or spend time around freshly cut grass; mowing stirs up pollen. Keep windows closed at night.  Keep car windows closed while driving. Minimize morning activities outdoors, a time when pollen counts are usually at their highest. Stay indoors as much as possible when pollen counts or humidity is high and on windy days when pollen tends to remain in the air longer. Use air conditioning when possible.  Many air conditioners have filters that trap the pollen spores. Use a HEPA room air filter to remove pollen form the indoor air you breathe.  Control of Mold Allergen   Mold and fungi can grow on a variety of surfaces provided certain temperature and moisture conditions exist.  Outdoor molds grow on plants, decaying vegetation and soil.  The major outdoor mold, Alternaria and Cladosporium, are found in very high numbers during hot and dry conditions.  Generally, a late Summer - Fall peak is seen for common outdoor fungal spores.  Rain will temporarily lower outdoor mold spore count, but counts rise rapidly when the rainy period ends.  The most important indoor molds are Aspergillus and Penicillium.  Dark, humid and poorly ventilated basements are ideal sites for mold growth.  The next most common sites of mold growth are the bathroom and the kitchen.  Outdoor (Seasonal) Mold Control   Use air conditioning and keep windows closed Avoid exposure to decaying vegetation. Avoid leaf raking. Avoid grain handling. Consider wearing a face mask if working in moldy areas.    Indoor (Perennial) Mold Control    Maintain humidity  below 50%. Clean washable surfaces with 5% bleach solution. Remove sources e.g. contaminated carpets.    Control of Dog or Cat Allergen  Avoidance is the best way to manage a dog or cat allergy. If you have a dog or cat and are allergic to dog or cats, consider removing the dog or cat from the home. If you have a dog or cat but don't want to find it a new home, or if your family wants a pet even though someone in the household is allergic, here are some strategies that may help keep symptoms at bay:  Keep the pet out of your bedroom and restrict it to only a few rooms. Be advised that keeping the dog or cat in only one room will not limit the allergens to that room. Don't pet, hug or kiss the dog or cat; if you do, wash your hands with soap and water. High-efficiency particulate air (HEPA) cleaners run continuously in a bedroom or living room can reduce allergen levels over time. Regular use of a high-efficiency vacuum cleaner or a central vacuum can reduce allergen levels. Giving your dog or cat a bath at least once a week can reduce airborne allergen.  Control of Dust Mite Allergen    Dust mites play a major role in allergic asthma and rhinitis.  They occur in environments with high humidity wherever human skin is found.  Dust mites absorb humidity from the atmosphere (ie, they do not drink) and feed on organic matter (including shed human and animal skin).  Dust mites are a microscopic type of insect that you cannot see with the naked eye.  High levels of dust mites have been detected from mattresses, pillows, carpets, upholstered furniture, bed covers, clothes, soft toys and any woven material.  The principal allergen of the dust mite is found in its feces.  A gram of dust may contain 1,000 mites and 250,000 fecal particles.  Mite antigen is  easily measured in the air during house cleaning activities.  Dust mites do not bite and do not cause harm to humans, other than by triggering  allergies/asthma.    Ways to decrease your exposure to dust mites in your home:  Encase mattresses, box springs and pillows with a mite-impermeable barrier or cover   Wash sheets, blankets and drapes weekly in hot water (130 F) with detergent and dry them in a dryer on the hot setting.  Have the room cleaned frequently with a vacuum cleaner and a damp dust-mop.  For carpeting or rugs, vacuuming with a vacuum cleaner equipped with a high-efficiency particulate air (HEPA) filter.  The dust mite allergic individual should not be in a room which is being cleaned and should wait 1 hour after cleaning before going into the room. Do not sleep on upholstered furniture (eg, couches).   If possible removing carpeting, upholstered furniture and drapery from the home is ideal.  Horizontal blinds should be eliminated in the rooms where the person spends the most time (bedroom, study, television room).  Washable vinyl, roller-type shades are optimal. Remove all non-washable stuffed toys from the bedroom.  Wash stuffed toys weekly like sheets and blankets above.   Reduce indoor humidity to less than 50%.  Inexpensive humidity monitors can be purchased at most hardware stores.  Do not use a humidifier as can make the problem worse and are not recommended.  Control of Dog or Cat Allergen  Avoidance is the best way to manage a dog or cat allergy. If you have a dog or cat and are allergic to dog or cats, consider removing the dog or cat from the home. If you have a dog or cat but don't want to find it a new home, or if your family wants a pet even though someone in the household is allergic, here are some strategies that may help keep symptoms at bay:  Keep the pet out of your bedroom and restrict it to only a few rooms. Be advised that keeping the dog or cat in only one room will not limit the allergens to that room. Don't pet, hug or kiss the dog or cat; if you do, wash your hands with soap and  water. High-efficiency particulate air (HEPA) cleaners run continuously in a bedroom or living room can reduce allergen levels over time. Regular use of a high-efficiency vacuum cleaner or a central vacuum can reduce allergen levels. Giving your dog or cat a bath at least once a week can reduce airborne allergen.  Allergy Shots  Allergies are the result of a chain reaction that starts in the immune system. Your immune system controls how your body defends itself. For instance, if you have an allergy to pollen, your immune system identifies pollen as an invader or allergen. Your immune system overreacts by producing antibodies called Immunoglobulin E (IgE). These antibodies travel to cells that release chemicals, causing an allergic reaction.  The concept behind allergy immunotherapy, whether it is received in the form of shots or tablets, is that the immune system can be desensitized to specific allergens that trigger allergy symptoms. Although it requires time and patience, the payback can be long-term relief. Allergy injections contain a dilute solution of those substances that you are allergic to based upon your skin testing and allergy history.   How Do Allergy Shots Work?  Allergy shots work much like a vaccine. Your body responds to injected amounts of a particular allergen given in increasing doses, eventually  developing a resistance and tolerance to it. Allergy shots can lead to decreased, minimal or no allergy symptoms.  There generally are two phases: build-up and maintenance. Build-up often ranges from three to six months and involves receiving injections with increasing amounts of the allergens. The shots are typically given once or twice a week, though more rapid build-up schedules are sometimes used.  The maintenance phase begins when the most effective dose is reached. This dose is different for each person, depending on how allergic you are and your response to the build-up  injections. Once the maintenance dose is reached, there are longer periods between injections, typically two to four weeks.  Occasionally doctors give cortisone-type shots that can temporarily reduce allergy symptoms. These types of shots are different and should not be confused with allergy immunotherapy shots.  Who Can Be Treated with Allergy Shots?  Allergy shots may be a good treatment approach for people with allergic rhinitis (hay fever), allergic asthma, conjunctivitis (eye allergy) or stinging insect allergy.   Before deciding to begin allergy shots, you should consider:   The length of allergy season and the severity of your symptoms  Whether medications and/or changes to your environment can control your symptoms  Your desire to avoid long-term medication use  Time: allergy immunotherapy requires a major time commitment  Cost: may vary depending on your insurance coverage  Allergy shots for children age 7 and older are effective and often well tolerated. They might prevent the onset of new allergen sensitivities or the progression to asthma.  Allergy shots are not started on patients who are pregnant but can be continued on patients who become pregnant while receiving them. In some patients with other medical conditions or who take certain common medications, allergy shots may be of risk. It is important to mention other medications you talk to your allergist.   What are the two types of build-ups offered:   RUSH or Rapid Desensitization -- one day of injections lasting from 8:30-4:30pm, injections every 1 hour.  Approximately half of the build-up process is completed in that one day.  The following week, normal build-up is resumed, and this entails ~16 visits either weekly or twice weekly, until reaching your "maintenance dose" which is continued weekly until eventually getting spaced out to every month for a duration of 3 to 5 years. The regular build-up appointments are nurse  visits where the injections are administered, followed by required monitoring for 30 minutes.    Traditional build-up -- weekly visits for 6 -12 months until reaching "maintenance dose", then continue weekly until eventually spacing out to every 4 weeks as above. At these appointments, the injections are administered, followed by required monitoring for 30 minutes.     Either way is acceptable, and both are equally effective. With the rush protocol, the advantage is that less time is spent here for injections overall AND you would also reach maintenance dosing faster (which is when the clinical benefit starts to become more apparent). Not everyone is a candidate for rapid desensitization.   IF we proceed with the RUSH protocol, there are premedications which must be taken the day before and the day after the rush only (this includes antihistamines, steroids, and Singulair).  After the rush day, no prednisone or Singulair is required, and we just recommend antihistamines taken on your injection day.  What Is An Estimate of the Costs?  If you are interested in starting allergy injections, please check with your insurance company about your coverage  for both allergy vial sets and allergy injections.  Please do so prior to making the appointment to start injections.  The following are CPT codes to give to your insurance company. These are the amounts we BILL to the insurance company, but the amount YOU WILL PAY and WE RECEIVE IS SUBSTANTIALLY LESS and depends on the contracts we have with different insurance companies.   Amount Billed to Insurance One allergy vial set  CPT 95165   $ 1200     Two allergy vial set  CPT 95165   $ 2400     Three allergy vial set  CPT 95165   $ 3600     One injection   CPT 95115   $ 35  Two injections   CPT 95117   $ 40 RUSH (Rapid Desensitization) CPT 95180 x 8 hours $500/hour  Regarding the allergy injections, your co-pay may or may not apply with each injection, so  please confirm this with your insurance company. When you start allergy injections, 1 or 2 sets of vials are made based on your allergies.  Not all patients can be on one set of vials. A set of vials lasts 6 months to a year depending on how quickly you can proceed with your build-up of your allergy injections. Vials are personalized for each patient depending on their specific allergens.  How often are allergy injection given during the build-up period?   Injections are given at least weekly during the build-up period until your maintenance dose is achieved. Per the doctor's discretion, you may have the option of getting allergy injections two times per week during the build-up period. However, there must be at least 48 hours between injections. The build-up period is usually completed within 6-12 months depending on your ability to schedule injections and for adjustments for reactions. When maintenance dose is reached, your injection schedule is gradually changed to every two weeks and later to every three weeks. Injections will then continue every 4 weeks. Usually, injections are continued for a total of 3-5 years.   When Will I Feel Better?  Some may experience decreased allergy symptoms during the build-up phase. For others, it may take as long as 12 months on the maintenance dose. If there is no improvement after a year of maintenance, your allergist will discuss other treatment options with you.  If you aren't responding to allergy shots, it may be because there is not enough dose of the allergen in your vaccine or there are missing allergens that were not identified during your allergy testing. Other reasons could be that there are high levels of the allergen in your environment or major exposure to non-allergic triggers like tobacco smoke.  What Is the Length of Treatment?  Once the maintenance dose is reached, allergy shots are generally continued for three to five years. The decision to stop  should be discussed with your allergist at that time. Some people may experience a permanent reduction of allergy symptoms. Others may relapse and a longer course of allergy shots can be considered.  What Are the Possible Reactions?  The two types of adverse reactions that can occur with allergy shots are local and systemic. Common local reactions include very mild redness and swelling at the injection site, which can happen immediately or several hours after. Report a delayed reaction from your last injection. These include arm swelling or runny nose, watery eyes or cough that occurs within 12-24 hours after injection. A systemic reaction, which is less  common, affects the entire body or a particular body system. They are usually mild and typically respond quickly to medications. Signs include increased allergy symptoms such as sneezing, a stuffy nose or hives.   Rarely, a serious systemic reaction called anaphylaxis can develop. Symptoms include swelling in the throat, wheezing, a feeling of tightness in the chest, nausea or dizziness. Most serious systemic reactions develop within 30 minutes of allergy shots. This is why it is strongly recommended you wait in your doctor's office for 30 minutes after your injections. Your allergist is trained to watch for reactions, and his or her staff is trained and equipped with the proper medications to identify and treat them.   Report to the nurse immediately if you experience any of the following symptoms: swelling, itching or redness of the skin, hives, watery eyes/nose, breathing difficulty, excessive sneezing, coughing, stomach pain, diarrhea, or light headedness. These symptoms may occur within 15-20 minutes after injection and may require medication.   Who Should Administer Allergy Shots?  The preferred location for receiving shots is your prescribing allergist's office. Injections can sometimes be given at another facility where the physician and staff  are trained to recognize and treat reactions, and have received instructions by your prescribing allergist.  What if I am late for an injection?   Injection dose will be adjusted depending upon how many days or weeks you are late for your injection.   What if I am sick?   Please report any illness to the nurse before receiving injections. She may adjust your dose or postpone injections depending on your symptoms. If you have fever, flu, sinus infection or chest congestion it is best to postpone allergy injections until you are better. Never get an allergy injection if your asthma is causing you problems. If your symptoms persist, seek out medical care to get your health problem under control.  What If I am or Become Pregnant:  Women that become pregnant should schedule an appointment with [facility] before receiving any further allergy injections.

## 2023-01-21 NOTE — Progress Notes (Signed)
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 Mild persistent asthma, uncomplicated - Lung testing looked great today. - I do want to start a daily controller medication to see if this can help with decreasing the use of your albuterol. - Daily controller medication(s): Arnuity one puff once daily - Prior to physical activity: albuterol 2 puffs 10-15 minutes before physical activity. - Rescue medications: albuterol 4 puffs every 4-6 hours as needed - Changes during respiratory infections or worsening symptoms: Increase Arnuity to 1 puff twice daily for TWO WEEKS. - Asthma control goals:  * Full participation in all desired activities (may need albuterol before activity) * Albuterol use two time or less a week on average (not counting use with activity) * Cough interfering with sleep two time or less a month * Oral steroids no more than once a year * No hospitalizations  2. Chronic rhinitis - Testing today showed: grasses, ragweed, weeds, trees, indoor molds, outdoor molds, dust mites, cat, dog, cockroach, and mixed feather, horse, tobacco - Copy of test results provided.  - Avoidance measures provided. - Continue with: Singulair (montelukast) 10mg  daily - Start taking: Ryaltris (olopatadine/mometasone) two sprays per nostril 1-2 times daily as needed - You can use an extra dose of the antihistamine, if needed, for breakthrough symptoms.  - Consider nasal saline rinses 1-2 times daily to remove allergens from the nasal cavities as well as help with mucous clearance (this is especially helpful to do before the  nasal sprays are given) - Consider allergy shots as a means of long-term control. - Allergy shots "re-train" and "reset" the immune system to ignore environmental allergens and decrease the resulting immune response to those allergens (sneezing, itchy watery eyes, runny nose, nasal congestion, etc).    - Allergy shots improve symptoms in 75-85% of patients.  - We can discuss more at the next appointment if the medications are not working for you.  3. 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  - EpiPen training reviewed. - Emergency Action Plan provided.   4. Return in about 2 months (around 03/24/2023). You can have the follow up appointment with Dr. Dellis Anes or a Nurse Practicioner (our Nurse Practitioners are excellent and always have Physician oversight!).     This note in its entirety was forwarded to the Provider who requested this consultation.  Subjective:   Renee English is a 32 y.o. 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    Allergic Rhinitis     Says she can't take antihistamines due to her being moody and no effectiveness.    Sinus Problem    Stays stopped up.     Renee English has a history of the following: Patient Active Problem List   Diagnosis Date Noted   Lisfranc dislocation, right, initial encounter    Alcohol abuse 11/07/2018   MDD (major depressive disorder), single episode, severe , no psychosis (HCC) 11/07/2018   Suicidal behavior 11/06/2018   Suicide attempt (  HCC) 11/06/2018   Contraceptive management 10/18/2013   Vaginal irritation 10/18/2013   BV (bacterial vaginosis) 01/10/2013   Postcoital bleeding 01/10/2013    History obtained from: chart review and patient.  Renee English was referred by Patient, No Pcp Per.     Renee English is a 32 y.o. female  presenting for an evaluation of asthma and allergies .   Asthma/Respiratory Symptom History: She has albuterol to use as needed. She esitmtest that she gets a refill every couple of months. She has had asthma since age of 69, but it has never been too severe.  She has never been hospitalized for it.  She has not been on systemic prednisone for her asthma alone, although she has been on prednisone for her sinus infections.   Allergic Rhinitis Symptom History: She has had sinus issues for years. This has been since she was a kid. She has never been tested. She has tried using Singulair for this, which she takes daily. She takes nasal spray. She has tried Nurse, adult. These are not cutting the trick and they make her irritable. These are year round. Worst time of the year is every day. She can tell when she is around animals and smoke. Every change of the seaosn, she gets the sinus issues.   She does get some URIs. She always needs antibiotics nad prednisone. She does get left sided ear infections. She estimates that she gets antibiotics around 5 times or more per year. This is normal for her. She has a clinic at work and she goes there to get it.   She had her tonsils taken out earlier this year. She had this done due to recurreent tonisillitis and Strep throat.   Food Allergy Symptom History: She has swelling with shellfish. She can eat tuna nad slamon and other items.   Otherwise, there is no history of other atopic diseases, including drug allergies, stinging insect allergies, or contact dermatitis. There is no significant infectious history. Vaccinations are up to date.    Past Medical History: Patient Active Problem List   Diagnosis Date Noted   Lisfranc dislocation, right, initial encounter    Alcohol abuse 11/07/2018   MDD (major depressive disorder), single episode, severe , no psychosis (HCC) 11/07/2018   Suicidal behavior 11/06/2018   Suicide attempt (HCC) 11/06/2018    Contraceptive management 10/18/2013   Vaginal irritation 10/18/2013   BV (bacterial vaginosis) 01/10/2013   Postcoital bleeding 01/10/2013    Medication List:  Allergies as of 01/21/2023       Reactions   Sulfa Antibiotics Anaphylaxis, Hives, Swelling   Sesame Oil Hives, Swelling   Shellfish Allergy Hives, Swelling   Rosuvastatin Other (See Comments)   Cramps        Medication List        Accurate as of January 21, 2023 11:59 PM. If you have any questions, ask your nurse or doctor.          STOP taking these medications    ciprofloxacin-dexamethasone OTIC suspension Commonly known as: Ciprodex Stopped by: Alfonse Spruce   ibuprofen 600 MG tablet Commonly known as: ADVIL Stopped by: Alfonse Spruce   ondansetron 4 MG disintegrating tablet Commonly known as: ZOFRAN-ODT Stopped by: Alfonse Spruce   oxyCODONE-acetaminophen 5-325 MG tablet Commonly known as: PERCOCET/ROXICET Stopped by: Alfonse Spruce   predniSONE 10 MG (21) Tbpk tablet Commonly known as: STERAPRED UNI-PAK 21 TAB Stopped by: Alfonse Spruce   promethazine-dextromethorphan 6.25-15  MG/5ML syrup Commonly known as: PROMETHAZINE-DM Stopped by: Alfonse Spruce       TAKE these medications    albuterol 108 (90 Base) MCG/ACT inhaler Commonly known as: VENTOLIN HFA Inhale 2 puffs into the lungs every 6 (six) hours as needed for wheezing or shortness of breath.   Arnuity Ellipta 200 MCG/ACT Aepb Generic drug: Fluticasone Furoate Inhale 1 puff into the lungs daily. Started by: Alfonse Spruce   b complex vitamins capsule Take 1 capsule by mouth every other day.   buPROPion 150 MG 24 hr tablet Commonly known as: WELLBUTRIN XL Take 150 mg by mouth daily.   diclofenac 50 MG EC tablet Commonly known as: VOLTAREN Take 50 mg by mouth as needed.   ergocalciferol 1.25 MG (50000 UT) capsule Commonly known as: VITAMIN D2 Take 50,000 Units by mouth once a  week.   fluticasone 50 MCG/ACT nasal spray Commonly known as: FLONASE Place 1 spray into both nostrils 2 (two) times daily.   levonorgestrel 20 MCG/DAY Iud Commonly known as: MIRENA 1 each by Intrauterine route once.   montelukast 10 MG tablet Commonly known as: SINGULAIR Take 10 mg by mouth daily as needed (allergies).   traZODone 50 MG tablet Commonly known as: DESYREL Take 75 mg by mouth at bedtime.        Birth History: non-contributory  Developmental History: non-contributory  Past Surgical History: Past Surgical History:  Procedure Laterality Date   FOOT ARTHRODESIS Right 03/03/2022   Procedure: MIDFOOT FUSION RIGHT FOOT;  Surgeon: Nadara Mustard, MD;  Location: [facility];  Service: Orthopedics;  Laterality: Right;   NO PAST SURGERIES     TONSILLECTOMY N/A 12/03/2022     Family History: Family History  Problem Relation Age of Onset   Asthma Mother    Allergic rhinitis Mother    Asthma Brother    Allergic rhinitis Brother    Allergic rhinitis Maternal Aunt    Cancer Maternal Aunt        breast   Diabetes Maternal Grandmother    Hypertension Maternal Grandmother    Cancer Maternal Grandmother        breast   Hypertension Maternal Grandfather    Diabetes Maternal Grandfather      Social History: Cassandra lives at home with her family.  She lives in a house that is 32 years old.  There is carpeting throughout the home.  She has electric heating and central cooling.  There are 2 dogs inside of the home.  There are no dust mite covers on the bedding.  There is no tobacco exposure in the house, but she is exposed in the car.  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  There is no fume, chemical, or dust exposure.  She does not have a HEPA filter in her home.  She does not live near an interstate or industrial area. She works at the [employer]. There is a Publishing rights manager there in their health department.    Review of systems  otherwise negative other than that mentioned in the HPI.    Objective:   Blood pressure 122/82, pulse (!) 105, temperature 98.8 F (37.1 C), temperature source Temporal, resp. rate 16, height 5' 2.6" (1.59 m), weight 172 lb 6.4 oz (78.2 kg), SpO2 98%. Body mass index is 30.93 kg/m.     Physical Exam Vitals reviewed.  Constitutional:      Appearance: She is well-developed.     Comments: Very pleasant.  HENT:     Head: Normocephalic and atraumatic.     Right Ear: Tympanic membrane, ear canal and external ear normal. No drainage, swelling or tenderness. Tympanic membrane is not injected, scarred, erythematous, retracted or bulging.     Left Ear: Tympanic membrane, ear canal and external ear normal. No drainage, swelling or tenderness. Tympanic membrane is not injected, scarred, erythematous, retracted or bulging.     Nose: No nasal deformity, septal deviation, mucosal edema or rhinorrhea.     Right Turbinates: Enlarged, swollen and pale.     Left Turbinates: Enlarged, swollen and pale.     Right Sinus: No maxillary sinus tenderness or frontal sinus tenderness.     Left Sinus: No maxillary sinus tenderness or frontal sinus tenderness.     Comments: No nasal polyps.    Mouth/Throat:     Mouth: Mucous membranes are not pale and not dry.     Pharynx: Uvula midline.  Eyes:     General:        Right eye: No discharge.        Left eye: No discharge.     Conjunctiva/sclera: Conjunctivae normal.     Right eye: Right conjunctiva is not injected. No chemosis.    Left eye: Left conjunctiva is not injected. No chemosis.    Pupils: Pupils are equal, round, and reactive to light.  Cardiovascular:     Rate and Rhythm: Normal rate and regular rhythm.     Heart sounds: Normal heart sounds.  Pulmonary:     Effort: Pulmonary effort is normal. No tachypnea, accessory muscle usage or respiratory distress.     Breath sounds: Normal breath sounds. No wheezing, rhonchi or rales.     Comments:  Moving air well in all lung fields.  No increased work of breathing. Chest:     Chest wall: No tenderness.  Abdominal:     Tenderness: There is no abdominal tenderness. There is no guarding or rebound.  Lymphadenopathy:     Head:     Right side of head: No submandibular, tonsillar or occipital adenopathy.     Left side of head: No submandibular, tonsillar or occipital adenopathy.     Cervical: No cervical adenopathy.  Skin:    Coloration: Skin is not pale.     Findings: No abrasion, erythema, petechiae or rash. Rash is not papular, urticarial or vesicular.  Neurological:     Mental Status: She is alert.  Psychiatric:        Behavior: Behavior is cooperative.      Diagnostic studies:    Spirometry: results normal (FEV1: 3.12/105%, FVC: 4.02/114%, FEV1/FVC: 78%).    Spirometry consistent with normal pattern.   Allergy Studies:     Airborne Adult Perc - 01/21/23 1534     Time Antigen Placed 1515    Allergen Manufacturer Waynette Buttery    Location Back    Number of Test 55    1. Control-Buffer 50% Glycerol Negative    2. Control-Histamine 2+    3. Bahia Negative    4. [place] Negative    5. Johnson Negative    6. [place] Blue 3+    7. Meadow Fescue 3+    8. Perennial Rye 2+    9. Timothy 3+    10. Ragweed Mix 3+    11. Cocklebur 2+    12. Plantain,  English Negative    13. Baccharis 3+    14. Dog Fennel 3+    15. Guernsey Thistle 2+  16. Lamb's Quarters 3+    17. Sheep Sorrell 3+    18. Rough Pigweed Negative    19. Marsh Elder, Rough 3+    20. Mugwort, Common 2+    21. Box, Elder 3+    22. Cedar, red 2+    23. Sweet Gum 3+    24. Pecan Pollen 3+    25. Pine Mix 3+    26. Walnut, Black Pollen 3+    27. Red Mulberry 2+    28. Ash Mix 2+    29. Birch Mix Negative    30. Beech American Negative    31. Cottonwood, [place] 2+    32. Hickory, White 3+    33. Maple Mix 3+    34. Oak, [place] Mix 2+    35. Sycamore Eastern 2+    36. Alternaria Alternata 2+    37.  Cladosporium Herbarum 2+    38. Aspergillus Mix 2+    39. Penicillium Mix 2+    40. Bipolaris Sorokiniana (Helminthosporium) 2+    41. Drechslera Spicifera (Curvularia) 2+    42. Mucor Plumbeus 3+    43. Fusarium Moniliforme Negative    44. Aureobasidium Pullulans (pullulara) 3+    45. Rhizopus Oryzae Negative    46. Botrytis Cinera 2+    47. Epicoccum Nigrum 3+    48. Phoma Betae 3+    49. Dust Mite Mix 3+    50. Cat Hair 10,000 BAU/ml 3+    51.  Dog Epithelia 2+    52. Mixed Feathers 2+    53. Horse Epithelia 2+    54. Cockroach, German 2+    55. Tobacco Leaf 3+             Food Adult Perc - 01/21/23 1500     Time Antigen Placed 1515    Allergen Manufacturer Waynette Buttery    Location Back    Number of allergen test 6    8. Shellfish Mix --   6 x 8   23. Shrimp --   2 x 4   24. Crab Negative    25. Lobster --   4 x 7   26. Oyster Negative    Comments SCALLOP: 3 x 5             Allergy testing results were read and interpreted by myself, documented by clinical staff.           Malachi Bonds, MD [facility]     /

## 2023-01-24 ENCOUNTER — Encounter: Payer: Self-pay | Admitting: Allergy & Immunology

## 2023-01-24 DIAGNOSIS — T7800XA Anaphylactic reaction due to unspecified food, initial encounter: Secondary | ICD-10-CM | POA: Insufficient documentation

## 2023-01-24 DIAGNOSIS — J4530 Mild persistent asthma, uncomplicated: Secondary | ICD-10-CM | POA: Insufficient documentation

## 2023-01-24 DIAGNOSIS — J302 Other seasonal allergic rhinitis: Secondary | ICD-10-CM | POA: Insufficient documentation

## 2023-01-24 MED ORDER — EPINEPHRINE 0.3 MG/0.3ML IJ SOAJ: 0.3000 mg | Freq: Once | INTRAMUSCULAR | 2 refills | Status: AC

## 2023-01-27 ENCOUNTER — Telehealth: Payer: Self-pay | Admitting: Allergy & Immunology

## 2023-01-27 NOTE — Telephone Encounter (Signed)
Left a message for patient to call the office in regards to her concerns with having Ryaltris sent to her pharmacy verse mail ordered pharmacy.

## 2023-01-27 NOTE — Telephone Encounter (Signed)
Patient has concerns about getting nasal spray sent to her pharmacy at her job instead of sending it to her home. Pharmacy name is [facility]. Address is [address] [place], [address] Medication is (Ryaltris). Please advice [phone].

## 2023-01-28 ENCOUNTER — Telehealth: Payer: Self-pay

## 2023-01-28 ENCOUNTER — Telehealth: Payer: Self-pay | Admitting: *Deleted

## 2023-01-28 DIAGNOSIS — J302 Other seasonal allergic rhinitis: Secondary | ICD-10-CM

## 2023-01-28 NOTE — Telephone Encounter (Signed)
-----   Message from Alfonse Spruce sent at 01/24/2023  9:31 AM EDT ----- Can someone call and see if she wanted to start shots? I did not put a script in. She can get them at her workplace with the health clinic there.

## 2023-01-28 NOTE — Telephone Encounter (Signed)
Patient called in to say that she would like to have her medication sent to [facility] in [place] on [address] [place]

## 2023-01-28 NOTE — Telephone Encounter (Signed)
Called and left a voicemail asking for patient to return call to discuss.  °

## 2023-01-28 NOTE — Telephone Encounter (Signed)
Patient called [facility]

## 2023-01-31 MED ORDER — ARNUITY ELLIPTA 200 MCG/ACT IN AEPB: 1.0000 | INHALATION_SPRAY | Freq: Every day | RESPIRATORY_TRACT | 5 refills | Status: DC

## 2023-01-31 MED ORDER — ALBUTEROL SULFATE HFA 108 (90 BASE) MCG/ACT IN AERS: 2.0000 | INHALATION_SPRAY | Freq: Four times a day (QID) | RESPIRATORY_TRACT | 1 refills | Status: DC | PRN

## 2023-01-31 MED ORDER — MONTELUKAST SODIUM 10 MG PO TABS: 10.0000 mg | ORAL_TABLET | Freq: Every day | ORAL | 5 refills | Status: DC | PRN

## 2023-01-31 MED ORDER — RYALTRIS 665-25 MCG/ACT NA SUSP: 2.0000 | Freq: Two times a day (BID) | NASAL | 5 refills | Status: DC | PRN

## 2023-01-31 NOTE — Telephone Encounter (Signed)
Spoke with patient she stated she does want to start allergy injections and will come by the [place] office to sign consent and make first appointment.

## 2023-01-31 NOTE — Telephone Encounter (Signed)
Spoke with patient, informed her that medications have been sent to the requested pharmacy except the Ryaltris as that goes to a mail ordered pharmacy. Patient has been made aware.

## 2023-01-31 NOTE — Telephone Encounter (Signed)
Ok we can send the meds there. I am fine with that.   Malachi Bonds, MD [facility]

## 2023-02-02 NOTE — Telephone Encounter (Signed)
Spoke with patient, informed her that medications have been sent to the requested pharmacy except the Ryaltris as that goes to a mail ordered pharmacy. Patient has been made aware.

## 2023-02-03 NOTE — Addendum Note (Signed)
Addended by: Alfonse Spruce on: 02/03/2023 07:21 PM   Modules accepted: Orders

## 2023-02-07 NOTE — Progress Notes (Signed)
Aeroallergen Immunotherapy   Ordering Provider: Dr. Malachi Bonds   Patient Details  Name: Renee English  MRN: [id number]  Date of Birth: Mar 17, 1991   Order 2 of 2   Vial Label: RW/Molds/DM/CR   0.3 ml (Volume)  1:20 Concentration -- Ragweed Mix  0.2 ml (Volume)  1:20 Concentration -- Alternaria alternata  0.2 ml (Volume)  1:20 Concentration -- Cladosporium herbarum  0.2 ml (Volume)  1:10 Concentration -- Aspergillus mix  0.2 ml (Volume)  1:10 Concentration -- Penicillium mix  0.2 ml (Volume)  1:20 Concentration -- Bipolaris sorokiniana  0.2 ml (Volume)  1:20 Concentration -- Drechslera spicifera  0.2 ml (Volume)  1:10 Concentration -- Mucor plumbeus  0.2 ml (Volume)  1:40 Concentration -- Aureobasidium pullulans  0.2 ml (Volume)  1:40 Concentration -- Epicoccum nigrum  0.2 ml (Volume)  1:40 Concentration -- Phoma betae  0.3 ml (Volume)  1:20 Concentration -- Cockroach, German  0.5 ml (Volume)   AU Concentration -- Mite Mix (DF 5,000 & DP 5,000)    3.1  ml Extract Subtotal  1.9  ml Diluent  5.0  ml Maintenance Total   Schedule:  A   Blue Vial (1:100,000): Schedule A (10 doses)  Yellow Vial (1:10,000): Schedule A (10 doses)  Green Vial (1:1,000): Schedule A (10 doses)  Red Vial (1:100): Schedule A (14 doses)   Special Instructions: After completion of the first Red Vial, please space to every two weeks. After completion of the second Red Vial, please space to every 4 weeks. Ok to up dose new vials at 0.56mL --> 0.3 mL --> 0.5 mL. Ok to come twice weekly, if desired, as long as there is 48 hours between injections.

## 2023-02-07 NOTE — Progress Notes (Signed)
Aeroallergen Immunotherapy  Ordering Provider: Dr. Malachi Bonds  Patient Details Name: Renee English MRN: [id number] Date of Birth: 09-01-1990  Order 1 of 2  Vial Label: G/W/T/C/D  0.3 ml (Volume)  BAU Concentration -- 7 Grass Mix* 100,000 ([address] [place], [place], [place], [place] Rye, RedTop, Sweet Vernal, Timothy) 0.2 ml (Volume)  1:20 Concentration -- Cocklebur 0.2 ml (Volume)  1:20 Concentration -- Guernsey Thistle 0.2 ml (Volume)  1:40 Concentration -- Baccharis 0.2 ml (Volume)  1:80 Concentration -- Dogfennel 0.5 ml (Volume)  1:20 Concentration -- Weed Mix* 0.5 ml (Volume)  1:20 Concentration -- Eastern 10 Tree Mix (also Sweet Gum) 0.2 ml (Volume)  1:20 Concentration -- Box Elder 0.2 ml (Volume)  1:10 Concentration -- Cedar, red 0.2 ml (Volume)  1:10 Concentration -- Pecan Pollen 0.2 ml (Volume)  1:10 Concentration -- Pine Mix 0.2 ml (Volume)  1:20 Concentration -- Red Mulberry 0.2 ml (Volume)  1:20 Concentration -- Sweet Gum 0.2 ml (Volume)  1:20 Concentration -- Walnut, Black Pollen 0.5 ml (Volume)  1:10 Concentration -- Cat Hair 0.5 ml (Volume)  1:10 Concentration -- Dog Epithelia   4.5  ml Extract Subtotal 0.5  ml Diluent 5.0  ml Maintenance Total  Schedule:  A  Blue Vial (1:100,000): Schedule A (10 doses) Yellow Vial (1:10,000): Schedule A (10 doses) Green Vial (1:1,000): Schedule A (10 doses) Red Vial (1:100): Schedule A (10 doses)  Special Instructions: After completion of the first Red Vial, please space to every two weeks. After completion of the second Red Vial, please space to every 4 weeks. Ok to up dose new vials at 0.7mL --> 0.3 mL --> 0.5 mL. Ok to come twice weekly, if desired, as long as there is 48 hours between injections.

## 2023-02-07 NOTE — Progress Notes (Signed)
VIALS NOT MADE UNTIL APPT SCHED. 

## 2023-02-14 ENCOUNTER — Telehealth: Payer: Self-pay | Admitting: Allergy & Immunology

## 2023-02-14 NOTE — Telephone Encounter (Signed)
Patient attempted to pick up the arnuity from pharmacy but was told a prior authorization would be needed.   Best contact number: [phone]

## 2023-02-15 ENCOUNTER — Other Ambulatory Visit: Payer: Self-pay

## 2023-02-15 ENCOUNTER — Telehealth: Payer: Self-pay

## 2023-02-15 NOTE — Telephone Encounter (Signed)
PA request has been Submitted. New Encounter created for follow up. For additional info see Pharmacy Prior Auth telephone encounter from 08/13.

## 2023-02-15 NOTE — Telephone Encounter (Signed)
*  Asthma/Allergy  Pharmacy Patient Advocate Encounter   Received notification from Pt Calls Messages that prior authorization for Arnuity Ellipta 200MCG/ACT aerosol powder  is required/requested.   Insurance verification completed.   The patient is insured through [facility] .   Per test claim: PA required; PA submitted to [employer] via CoverMyMeds Key/confirmation #/EOC [id number] Status is pending

## 2023-02-16 ENCOUNTER — Other Ambulatory Visit (HOSPITAL_COMMUNITY): Payer: Self-pay

## 2023-02-16 NOTE — Telephone Encounter (Signed)
PA has now been APPROVED test claim shows $0.00 co-pay at this time.

## 2023-02-17 NOTE — Telephone Encounter (Addendum)
Called patient - DOB/Pharmacy verified - advised of below notation.  Patient advised she can p/u Arnuity Ellipta @ [facility].  Patient verbalized understanding to all, no further questions.

## 2023-02-28 NOTE — Telephone Encounter (Signed)
Patient called and stated that the pharmacy does not have the approval and that it is not a zero dollar copay. Do you happen to have the approval documentation to send to the pharmacy? The one scanned into the media shows that the PA was cancelled. I have placed samples up front for the patient for now so that she does not go without the inhaler. Albin Felling has informed patient of samples so that she can come pick them up.

## 2023-03-02 ENCOUNTER — Other Ambulatory Visit (HOSPITAL_COMMUNITY): Payer: Self-pay

## 2023-03-02 NOTE — Telephone Encounter (Signed)
I understand but in the previous message you stated that PA was Approved. If the paperwork says it was cancelled because it shows that it is covered then it is technically not approved like you stated. I'll reach out to the pharmacy.

## 2023-03-02 NOTE — Telephone Encounter (Signed)
As the cancellation papers state, a PA is not needed as this is a covered medication with a last paid claim from 07/22. I am not sure how the other pharmacy is trying to process the medication but we are showing a 30 day supply for $0.00 when processing through [facility] networks.

## 2023-03-03 NOTE — Telephone Encounter (Signed)
PA has been submitted through CoverMyMeds for Arnuity and is currently pending approval/denial.  

## 2023-03-08 ENCOUNTER — Other Ambulatory Visit (HOSPITAL_COMMUNITY): Payer: Self-pay

## 2023-03-08 NOTE — Addendum Note (Signed)
Addended by: Rolland Bimler D on: 03/08/2023 11:43 AM   Modules accepted: Orders

## 2023-03-08 NOTE — Telephone Encounter (Signed)
Called and spoke with patient pharmacy, [facility] in [place] [phone]) and the technician stated that her Arnuity has been filled and ready for pick-up since 02-22-2023 with a $0.00 co-pay

## 2023-03-09 NOTE — Telephone Encounter (Signed)
Called and informed the patient. Patient verbalized understanding and will call back if she needs anything further.

## 2023-03-25 ENCOUNTER — Ambulatory Visit: Payer: BC Managed Care – PPO | Admitting: Family Medicine

## 2023-04-01 ENCOUNTER — Ambulatory Visit (INDEPENDENT_AMBULATORY_CARE_PROVIDER_SITE_OTHER): Payer: BC Managed Care – PPO | Admitting: Allergy & Immunology

## 2023-04-01 ENCOUNTER — Encounter: Payer: Self-pay | Admitting: Allergy & Immunology

## 2023-04-01 ENCOUNTER — Other Ambulatory Visit: Payer: Self-pay

## 2023-04-01 ENCOUNTER — Ambulatory Visit: Payer: BC Managed Care – PPO | Admitting: Family Medicine

## 2023-04-01 VITALS — BP 124/86 | HR 92 | Temp 98.4°F | Resp 18

## 2023-04-01 DIAGNOSIS — J3089 Other allergic rhinitis: Secondary | ICD-10-CM | POA: Diagnosis not present

## 2023-04-01 DIAGNOSIS — L500 Allergic urticaria: Secondary | ICD-10-CM

## 2023-04-01 DIAGNOSIS — J302 Other seasonal allergic rhinitis: Secondary | ICD-10-CM | POA: Diagnosis not present

## 2023-04-01 DIAGNOSIS — J4530 Mild persistent asthma, uncomplicated: Secondary | ICD-10-CM | POA: Diagnosis not present

## 2023-04-01 DIAGNOSIS — T7800XD Anaphylactic reaction due to unspecified food, subsequent encounter: Secondary | ICD-10-CM

## 2023-04-01 MED ORDER — RYALTRIS 665-25 MCG/ACT NA SUSP: 2.0000 | Freq: Two times a day (BID) | NASAL | 5 refills | Status: DC | PRN

## 2023-04-01 MED ORDER — MONTELUKAST SODIUM 10 MG PO TABS: 10.0000 mg | ORAL_TABLET | Freq: Every day | ORAL | 3 refills | Status: DC | PRN

## 2023-04-01 NOTE — Progress Notes (Deleted)
[address] [place] [address] Dept: [phone]  FOLLOW UP NOTE  Patient ID: Renee English, female    DOB: 1990/09/06  Age: 32 y.o. MRN: [id number] Date of Office Visit: 04/01/2023  Assessment  Chief Complaint: No chief complaint on file.  HPI [name] is a 32 year old female who presents to the clinic for follow-up visit.  She was last seen in this clinic on 01/21/2023 as a new patient by Dr. Dellis Anes for evaluation of asthma, allergic rhinitis, and food allergy to shellfish.  Her last environmental allergy skin testing was on 01/21/2023 was positive to grass pollen, weed pollen, ragweed pollen, tree pollen, mold, dust mite, cat, dog, cockroach, feather, horse, and tobacco.   Drug Allergies:  Allergies  Allergen Reactions   Sulfa Antibiotics Anaphylaxis, Hives and Swelling   Sesame Oil Hives and Swelling   Shellfish Allergy Hives and Swelling   Rosuvastatin Other (See Comments)    Cramps    Physical Exam: There were no vitals taken for this visit.   Physical Exam  Diagnostics:    Assessment and Plan: No diagnosis found.  No orders of the defined types were placed in this encounter.   There are no Patient Instructions on file for this visit.  No follow-ups on file.    Thank you for the opportunity to care for this patient.  Please do not hesitate to contact me with questions.  Thermon Leyland, FNP [facility]

## 2023-04-01 NOTE — Patient Instructions (Addendum)
1. Mild persistent asthma, uncomplicated - Lung testing looked great today. - I am glad that the Arnuity worked out so well.  - Daily controller medication(s): Arnuity one puff once daily - Prior to physical activity: albuterol 2 puffs 10-15 minutes before physical activity. - Rescue medications: albuterol 4 puffs every 4-6 hours as needed - Changes during respiratory infections or worsening symptoms: Increase Arnuity to 1 puff twice daily for TWO WEEKS. - Asthma control goals:  * Full participation in all desired activities (may need albuterol before activity) * Albuterol use two time or less a week on average (not counting use with activity) * Cough interfering with sleep two time or less a month * Oral steroids no more than once a year * No hospitalizations  2. Perennial and seasonal allergic rhinitis - Previous testing showed: grasses, ragweed, weeds, trees, indoor molds, outdoor molds, dust mites, cat, dog, cockroach, and mixed feather, horse, tobacco - Continue with: Singulair (montelukast) 10mg  daily - Continue with: Ryaltris (olopatadine/mometasone) two sprays per nostril 1-2 times daily as needed - You can use an extra dose of the antihistamine, if needed, for breakthrough symptoms.  - Consider nasal saline rinses 1-2 times daily to remove allergens from the nasal cavities as well as help with mucous clearance (this is especially helpful to do before the nasal sprays are given) - Allergy shot consent signed. - Make an appointment to start your shots. - Form filled out for your APNP at your workplace to give the injections.   3. 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  - EpiPen training reviewed. - Emergency Action Plan provided.   4. Return in about 6 months (around 09/29/2023). You can have the follow up appointment with Dr. Dellis Anes or a Nurse Practicioner (our Nurse Practitioners are excellent and always have Physician  oversight!).    Please inform us of any Emergency Department visits, hospitalizations, or changes in symptoms. Call us before going to the ED for breathing or allergy symptoms since we might be able to fit you in for a sick visit. Feel free to contact us anytime with any questions, problems, or concerns.  It was a pleasure to see you again and meet your mother today!  Websites that have reliable patient information: 1. American Academy of Asthma, Allergy, and Immunology: www.aaaai.org 2. Food Allergy Research and Education (FARE): foodallergy.org 3. Mothers of Asthmatics: http://www.asthmacommunitynetwork.org 4. American College of Allergy, Asthma, and Immunology: www.acaai.org   COVID-19 Vaccine Information can be found at: [email or web address] For questions related to vaccine distribution or appointments, please email [email or web address] or call [phone].     "Like" [place] on Facebook and Instagram for our latest updates!      A healthy democracy works best when [employer] participate! Make sure you are registered to vote! If you have moved or changed any of your contact information, you will need to get this updated before voting! Scan the QR codes below to learn more!

## 2023-04-01 NOTE — Progress Notes (Signed)
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 Mild persistent asthma, uncomplicated - Lung testing looked great today. - I am glad that the Arnuity worked out so well.  - Daily controller medication(s): Arnuity one puff once daily - Prior to physical activity: albuterol 2 puffs 10-15 minutes before physical activity. - Rescue medications: albuterol 4 puffs every 4-6 hours as needed - Changes during respiratory infections or worsening symptoms: Increase Arnuity to 1 puff twice daily for TWO WEEKS. - Asthma control goals:  * Full participation in all desired activities (may need albuterol before activity) * Albuterol use two time or less a week on average (not counting use with activity) * Cough interfering with sleep two time or less a month * Oral steroids no more than once a year * No hospitalizations  2. Perennial and seasonal allergic rhinitis - Previous testing showed: grasses, ragweed, weeds, trees, indoor molds, outdoor molds, dust mites, cat, dog, cockroach, and mixed feather, horse, tobacco - Continue with: Singulair (montelukast) 10mg  daily - Continue with: Ryaltris (olopatadine/mometasone) two sprays per nostril 1-2 times daily as needed - You can use an extra dose of the antihistamine, if needed, for breakthrough symptoms.  - Consider nasal saline rinses 1-2 times daily to remove allergens from the nasal cavities as well as help with mucous clearance (this is especially helpful to do before the nasal sprays are given) - Allergy shot consent signed. - Make an appointment to start your shots. - Form filled out for your APNP at your workplace to  give the injections.   3. 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  - EpiPen training reviewed. - Emergency Action Plan provided.   4. Return in about 6 months (around 09/29/2023). You can have the follow up appointment with Dr. Dellis Anes or a Nurse Practicioner (our Nurse Practitioners are excellent and always have Physician oversight!).   Subjective:   Renee English is a 32 y.o. female presenting today for follow up of  Chief Complaint  Patient presents with   Follow-up    Interested in AIT    Renee English has a history of the following: Patient Active Problem List   Diagnosis Date Noted   Mild persistent asthma, uncomplicated 01/24/2023   Anaphylactic shock due to adverse food reaction 01/24/2023   Seasonal and perennial allergic rhinitis 01/24/2023   Lisfranc dislocation, right, initial encounter    Alcohol abuse 11/07/2018   MDD (major depressive disorder), single episode, severe , no psychosis (HCC) 11/07/2018   Suicidal behavior 11/06/2018   Suicide attempt (HCC) 11/06/2018   Contraceptive management 10/18/2013   Vaginal irritation 10/18/2013   BV (bacterial vaginosis) 01/10/2013   Postcoital bleeding 01/10/2013    History obtained from: chart review and patient.  Discussed the use of AI scribe software for clinical note transcription with the patient, who gave verbal consent to proceed.  Renee English is a 32 y.o. female presenting for a follow up visit.  She was last seen in July 2024.  At that time, we started her on Arnuity 200 mcg 1 puff once daily as well as albuterol as needed.  For her rhinitis, she had multiple indoor and outdoor allergens.  We continue with Singulair and started Ryaltris 1 to 2 sprays per nostril up to twice daily.  She had testing that was positive to shrimp, lobster, and scallops.  We recommended avoidance of shellfish.  We gave her an emergency action plan and taught her how to use an EpiPen.  Since  last visit, she has done well.  Asthma/Respiratory Symptom History: They have recently started Arnuity, which has led to a decrease in coughing and no need for a rescue inhaler. However, they experienced some difficulty with insurance coverage for this medication, initially costing fifty dollars, but now covered fully.  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 The patient also had a recent bout of COVID-19 in August, from which they and their family have fully recovered. They deny any recent sinus or ear infections.   Allergic Rhinitis Symptom History: The patient reports improvement in symptoms with current medications, including Singulair and a nasal spray. They also occasionally use an antihistamine for acute symptoms, which reportedly causes them to feel unwell.  Despite the improvement in symptoms with medications, the patient expresses a desire to start allergy shots. They have a potential arrangement to receive these shots through a nurse practitioner at their workplace, the [employer].  Otherwise, there have been no changes to her past medical history, surgical history, family history, or social history.    Review of systems otherwise negative other than that mentioned in the HPI.    Objective:   Blood pressure 124/86, pulse 92, temperature 98.4 F (36.9 C), resp. rate 18, SpO2 95%. There is no height or weight on file to calculate BMI.    Physical Exam Vitals reviewed.  Constitutional:      Appearance: She is well-developed.     Comments: Very pleasant.  HENT:     Head: Normocephalic and atraumatic.     Right Ear: Tympanic membrane, ear canal and external ear normal. No drainage, swelling or tenderness. Tympanic membrane is not injected, scarred, erythematous, retracted or bulging.     Left Ear: Tympanic membrane, ear canal and external ear normal. No drainage, swelling or tenderness. Tympanic membrane is not injected, scarred, erythematous, retracted or bulging.      Nose: No nasal deformity, septal deviation, mucosal edema or rhinorrhea.     Right Turbinates: Enlarged, swollen and pale.     Left Turbinates: Enlarged, swollen and pale.     Right Sinus: No maxillary sinus tenderness or frontal sinus tenderness.     Left Sinus: No maxillary sinus tenderness or frontal sinus tenderness.     Comments: No nasal polyps.  Much less rhinorrhea today.    Mouth/Throat:     Lips: Pink.     Mouth: Mucous membranes are moist. Mucous membranes are not pale and not dry.     Pharynx: Uvula midline.     Tonsils: No tonsillar exudate or tonsillar abscesses. 1+ on the right. 1+ on the left.     Comments: Cobblestoning less prominent. Eyes:     General:        Right eye: No discharge.        Left eye: No discharge.     Conjunctiva/sclera: Conjunctivae normal.     Right eye: Right conjunctiva is not injected. No chemosis.    Left eye: Left conjunctiva is not injected. No chemosis.    Pupils: Pupils are equal, round, and reactive to light.  Cardiovascular:     Rate and Rhythm: Normal rate and regular rhythm.  Heart sounds: Normal heart sounds.  Pulmonary:     Effort: Pulmonary effort is normal. No tachypnea, accessory muscle usage or respiratory distress.     Breath sounds: Normal breath sounds. No wheezing, rhonchi or rales.     Comments: Moving air well in all lung fields.  No increased work of breathing. Chest:     Chest wall: No tenderness.  Lymphadenopathy:     Head:     Right side of head: No submandibular, tonsillar or occipital adenopathy.     Left side of head: No submandibular, tonsillar or occipital adenopathy.     Cervical: No cervical adenopathy.  Skin:    Coloration: Skin is not pale.     Findings: No abrasion, erythema, petechiae or rash. Rash is not papular, urticarial or vesicular.  Neurological:     Mental Status: She is alert.  Psychiatric:        Behavior: Behavior is cooperative.      Diagnostic studies:    Spirometry: results  normal (FEV1: 2.91/98%, FVC: 3.43/97%, FEV1/FVC: 85%).    Spirometry consistent with normal pattern.   Allergy Studies: none        Malachi Bonds, MD  [facility]

## 2023-04-22 ENCOUNTER — Ambulatory Visit: Payer: BC Managed Care – PPO

## 2023-04-25 DIAGNOSIS — J3081 Allergic rhinitis due to animal (cat) (dog) hair and dander: Secondary | ICD-10-CM | POA: Diagnosis not present

## 2023-04-25 NOTE — Progress Notes (Signed)
VIALS MADE 04-24-24

## 2023-04-26 DIAGNOSIS — J302 Other seasonal allergic rhinitis: Secondary | ICD-10-CM | POA: Diagnosis not present

## 2023-05-02 ENCOUNTER — Ambulatory Visit (INDEPENDENT_AMBULATORY_CARE_PROVIDER_SITE_OTHER): Payer: BC Managed Care – PPO

## 2023-05-02 DIAGNOSIS — J309 Allergic rhinitis, unspecified: Secondary | ICD-10-CM

## 2023-05-02 NOTE — Progress Notes (Signed)
Immunotherapy   Patient Details  Name: Renee English MRN: [id number] Date of Birth: 30-Apr-1991  05/02/2023  Renee English started injections for  grasses, ragweed's, cockroaches, dogs, cats, trees, weeds, molds, and dust mites. Following schedule: A  Frequency:2 times per week Epi-Pen:Epi-Pen Available  Consent signed previously and patient instructions given. Patient sat in the lobby for thirty minutes without an issue.    [name] 05/02/2023, 8:37 AM

## 2023-05-03 DIAGNOSIS — F419 Anxiety disorder, unspecified: Secondary | ICD-10-CM | POA: Diagnosis not present

## 2023-05-03 DIAGNOSIS — G43909 Migraine, unspecified, not intractable, without status migrainosus: Secondary | ICD-10-CM | POA: Diagnosis not present

## 2023-05-04 ENCOUNTER — Encounter: Payer: Self-pay | Admitting: Family Medicine

## 2023-05-04 ENCOUNTER — Telehealth: Payer: Self-pay

## 2023-05-04 ENCOUNTER — Other Ambulatory Visit: Payer: Self-pay

## 2023-05-04 ENCOUNTER — Ambulatory Visit: Payer: BC Managed Care – PPO | Admitting: Family Medicine

## 2023-05-04 DIAGNOSIS — T7800XD Anaphylactic reaction due to unspecified food, subsequent encounter: Secondary | ICD-10-CM | POA: Diagnosis not present

## 2023-05-04 DIAGNOSIS — J45909 Unspecified asthma, uncomplicated: Secondary | ICD-10-CM

## 2023-05-04 DIAGNOSIS — J4530 Mild persistent asthma, uncomplicated: Secondary | ICD-10-CM

## 2023-05-04 DIAGNOSIS — L500 Allergic urticaria: Secondary | ICD-10-CM

## 2023-05-04 DIAGNOSIS — J302 Other seasonal allergic rhinitis: Secondary | ICD-10-CM

## 2023-05-04 MED ORDER — CARBINOXAMINE MALEATE 4 MG/5ML PO SOLN: ORAL | 0 refills | Status: DC

## 2023-05-04 MED ORDER — CARBINOXAMINE MALEATE 4 MG PO TABS: ORAL_TABLET | ORAL | 2 refills | Status: DC

## 2023-05-04 NOTE — Patient Instructions (Signed)
Asthma Continue Arnuity 200-1 puff once a day to prevent cough or wheeze Continue montelukast 10 mg once a day to prevent cough or wheeze Continue albuterol 2 puffs once every 4 hours as needed for cough or wheeze You may use albuterol 2 puffs 5 to 15 minutes before activity to decrease cough  Allergic rhinitis Continue allergen avoidance measures directed toward pollens, pets, cockroach, dust mite, and mold as listed below Begin carbinoxamine once or twice a day.  Try taking this medication at nighttime initially and increase to twice a day if needed to control symptoms of allergic rhinitis, hives, and itch Continue Ryaltris 2 sprays in each nostril up to twice a day as needed for nasal symptoms Consider saline nasal rinses as needed for nasal symptoms. Use this before any medicated nasal sprays for best result Continue allergen immunotherapy and have an epinephrine autoinjector set per protocol.  We will repeat your last dose at next week's visit  Urticaria Begin carbinoxamine once or twice a day to control hives If your symptoms re-occur, begin a journal of events that occurred for up to 6 hours before your symptoms began including foods and beverages consumed, soaps or perfumes you had contact with, and medications.   Food allergy Continue to avoid shellfish.  In case of an allergic reaction, take Benadryl 50 mg  every 4 hours, and if life-threatening symptoms occur, inject with EpiPen 0.3 mg.  Call the clinic if this treatment plan is not working well for you.  Follow up in 2 months or sooner if needed.  Reducing Pollen Exposure The American Academy of Allergy, Asthma and Immunology suggests the following steps to reduce your exposure to pollen during allergy seasons. Do not hang sheets or clothing out to dry; pollen may collect on these items. Do not mow lawns or spend time around freshly cut grass; mowing stirs up pollen. Keep windows closed at night.  Keep car windows closed while  driving. Minimize morning activities outdoors, a time when pollen counts are usually at their highest. Stay indoors as much as possible when pollen counts or humidity is high and on windy days when pollen tends to remain in the air longer. Use air conditioning when possible.  Many air conditioners have filters that trap the pollen spores. Use a HEPA room air filter to remove pollen form the indoor air you breathe.  Control of Mold Allergen Mold and fungi can grow on a variety of surfaces provided certain temperature and moisture conditions exist.  Outdoor molds grow on plants, decaying vegetation and soil.  The major outdoor mold, Alternaria and Cladosporium, are found in very high numbers during hot and dry conditions.  Generally, a late Summer - Fall peak is seen for common outdoor fungal spores.  Rain will temporarily lower outdoor mold spore count, but counts rise rapidly when the rainy period ends.  The most important indoor molds are Aspergillus and Penicillium.  Dark, humid and poorly ventilated basements are ideal sites for mold growth.  The next most common sites of mold growth are the bathroom and the kitchen.  Outdoor [employer] Use air conditioning and keep windows closed Avoid exposure to decaying vegetation. Avoid leaf raking. Avoid grain handling. Consider wearing a face mask if working in moldy areas.  Indoor Mold Control Maintain humidity below 50%. Clean washable surfaces with 5% bleach solution. Remove sources e.g. Contaminated carpets.   Control of Dust Mite Allergen Dust mites play a major role in allergic asthma and rhinitis. They occur in  environments with high humidity wherever human skin is found. Dust mites absorb humidity from the atmosphere (ie, they do not drink) and feed on organic matter (including shed human and animal skin). Dust mites are a microscopic type of insect that you cannot see with the naked eye. High levels of dust mites have been detected from  mattresses, pillows, carpets, upholstered furniture, bed covers, clothes, soft toys and any woven material. The principal allergen of the dust mite is found in its feces. A gram of dust may contain 1,000 mites and 250,000 fecal particles. Mite antigen is easily measured in the air during house cleaning activities. Dust mites do not bite and do not cause harm to humans, other than by triggering allergies/asthma.  Ways to decrease your exposure to dust mites in your home:  1. Encase mattresses, box springs and pillows with a mite-impermeable barrier or cover  2. Wash sheets, blankets and drapes weekly in hot water (130 F) with detergent and dry them in a dryer on the hot setting.  3. Have the room cleaned frequently with a vacuum cleaner and a damp dust-mop. For carpeting or rugs, vacuuming with a vacuum cleaner equipped with a high-efficiency particulate air (HEPA) filter. The dust mite allergic individual should not be in a room which is being cleaned and should wait 1 hour after cleaning before going into the room.  4. Do not sleep on upholstered furniture (eg, couches).  5. If possible removing carpeting, upholstered furniture and drapery from the home is ideal. Horizontal blinds should be eliminated in the rooms where the person spends the most time (bedroom, study, television room). Washable vinyl, roller-type shades are optimal.  6. Remove all non-washable stuffed toys from the bedroom. Wash stuffed toys weekly like sheets and blankets above.  7. Reduce indoor humidity to less than 50%. Inexpensive humidity monitors can be purchased at most hardware stores. Do not use a humidifier as can make the problem worse and are not recommended.  Control of Dog or Cat Allergen Avoidance is the best way to manage a dog or cat allergy. If you have a dog or cat and are allergic to dog or cats, consider removing the dog or cat from the home. If you have a dog or cat but don't want to find it a new home,  or if your family wants a pet even though someone in the household is allergic, here are some strategies that may help keep symptoms at bay:  Keep the pet out of your bedroom and restrict it to only a few rooms. Be advised that keeping the dog or cat in only one room will not limit the allergens to that room. Don't pet, hug or kiss the dog or cat; if you do, wash your hands with soap and water. High-efficiency particulate air (HEPA) cleaners run continuously in a bedroom or living room can reduce allergen levels over time. Regular use of a high-efficiency vacuum cleaner or a central vacuum can reduce allergen levels. Giving your dog or cat a bath at least once a week can reduce airborne allergen.  Control of Cockroach Allergen Cockroach allergen has been identified as an important cause of acute attacks of asthma, especially in urban settings.  There are fifty-five species of cockroach that exist in the [place], however only three, the [place], [place] species produce allergen that can affect patients with Asthma.  Allergens can be obtained from fecal particles, egg casings and secretions from cockroaches.    Remove food  sources. Reduce access to water. Seal access and entry points. Spray runways with 0.5-1% Diazinon or Chlorpyrifos Blow boric acid power under stoves and refrigerator. Place bait stations (hydramethylnon) at feeding sites.

## 2023-05-04 NOTE — Telephone Encounter (Signed)
I called patient and left a detailed message informing okay to come in to get allergy injection next Monday per Thurston Hole.

## 2023-05-04 NOTE — Progress Notes (Signed)
RE: Renee English MRN: [id number] DOB: Nov 23, 1990 Date of Telemedicine Visit: 05/04/2023  Referring provider: No ref. provider found Primary care provider: Patient, No Pcp Per  Chief Complaint: Immunotherapy (Skin felt tight and swollen around the whole body. )   Telemedicine Follow Up Visit via Telephone: I connected with Renee English for a follow up on 05/04/23 by telephone and verified that I am speaking with the correct person using two identifiers.   I discussed the limitations, risks, security and privacy concerns of performing an evaluation and management service by telephone and the availability of in person appointments. I also discussed with the patient that there may be a patient responsible charge related to this service. The patient expressed understanding and agreed to proceed.  Patient is at home  Provider is at the office.  Visit start time: 330 Visit end time: 54 Insurance consent/check in by: [place] [facility] consent and medical assistant/nurse: Byrd Hesselbach  History of Present Illness: She is a 32 y.o. female, who is being followed for asthma, allergic rhinitis, urticaria, and allergy to shellfish. Her previous allergy office visit was on 04/01/2023 with Dr. Dellis Anes.  In the interim, she received her first allergen immunotherapy directed toward grass pollen, weed pollen, tree pollen, cat, dog, ragweed, mold, dust mite, and cockroach 0.05 mL from the 07-998 vial on Monday, 05/02/2023.  She reports that later on Monday evening she began to experience hives on her arms and legs that were raised, red, and itchy.  She reports that she took Allegra at that time with moderate relief of symptoms.  She reports that the hives remained until yesterday at which point they began to resolve.  She does report that yesterday she began to feel bloated and noticed some swelling in her face, hands, and elbows.  She denies cardiopulmonary or gastrointestinal symptoms with this bloated and swelling  feeling.  She is not currently taking an antihistamine on a daily basis.  She reports that she has tried Careers adviser, Claritin, and Zyrtec which all resulted in side effects including headache, drowsiness, and meanness.  She continues Ryaltris and is not currently using a nasal saline rise. She does report some episodes of urticaria occurring especially if she has spent a lot of time outside.  Asthma is reported as well-controlled with no shortness of breath, cough, or wheeze with activity or rest.  She continues Arnuity daily and has not needed to use albuterol since her last visit to this clinic.  She continues montelukast 10 mg once a day with no adverse reaction.  She continues to avoid shellfish with no accidental ingestion or EpiPen use since her last visit to this clinic.  EpiPen's are up-to-date.  Her current medications are listed in the chart.  Assessment and Plan: Renee English is a 32 y.o. female with: Patient Instructions  Asthma Continue Arnuity 200-1 puff once a day to prevent cough or wheeze Continue montelukast 10 mg once a day to prevent cough or wheeze Continue albuterol 2 puffs once every 4 hours as needed for cough or wheeze You may use albuterol 2 puffs 5 to 15 minutes before activity to decrease cough  Allergic rhinitis Continue allergen avoidance measures directed toward pollens, pets, cockroach, dust mite, and mold as listed below Begin carbinoxamine once or twice a day.  Try taking this medication at nighttime initially and increase to twice a day if needed to control symptoms of allergic rhinitis, hives, and itch Continue Ryaltris 2 sprays in each nostril up to twice a day as  needed for nasal symptoms Consider saline nasal rinses as needed for nasal symptoms. Use this before any medicated nasal sprays for best result Continue allergen immunotherapy and have an epinephrine autoinjector set per protocol.  We will repeat your last dose at next week's visit  Urticaria Begin  carbinoxamine once or twice a day to control hives If your symptoms re-occur, begin a journal of events that occurred for up to 6 hours before your symptoms began including foods and beverages consumed, soaps or perfumes you had contact with, and medications.   Food allergy Continue to avoid shellfish.  In case of an allergic reaction, take Benadryl 50 mg  every 4 hours, and if life-threatening symptoms occur, inject with EpiPen 0.3 mg.  Call the clinic if this treatment plan is not working well for you.  Follow up in 2 months or sooner if needed.    Return in about 2 months (around 07/04/2023), or if symptoms worsen or fail to improve.  Meds ordered this encounter  Medications   DISCONTD: Carbinoxamine Maleate 4 MG/5ML SOLN    Sig: Take one 4 mg tablet once or twice a day    Dispense:  118 mL    Refill:  0   Carbinoxamine Maleate 4 MG TABS    Sig: Take one tablet once or twice a day to control hives or itch    Dispense:  60 tablet    Refill:  2     Medication List:  Current Outpatient Medications  Medication Sig Dispense Refill   albuterol (VENTOLIN HFA) 108 (90 Base) MCG/ACT inhaler Inhale 2 puffs into the lungs every 6 (six) hours as needed for wheezing or shortness of breath. 8 g 1   b complex vitamins capsule Take 1 capsule by mouth every other day.     buPROPion (WELLBUTRIN XL) 150 MG 24 hr tablet Take 150 mg by mouth daily.     Carbinoxamine Maleate 4 MG TABS Take one tablet once or twice a day to control hives or itch 60 tablet 2   diclofenac (VOLTAREN) 50 MG EC tablet Take 50 mg by mouth as needed.     EPINEPHrine 0.3 mg/0.3 mL IJ SOAJ injection SMARTSIG:0.3 Milligram(s) IM Once     ergocalciferol (VITAMIN D2) 1.25 MG (50000 UT) capsule Take 50,000 Units by mouth once a week.     Fluticasone Furoate (ARNUITY ELLIPTA) 200 MCG/ACT AEPB Inhale 1 puff into the lungs daily. 30 each 5   levonorgestrel (MIRENA) 20 MCG/DAY IUD 1 each by Intrauterine route once.     montelukast  (SINGULAIR) 10 MG tablet Take 1 tablet (10 mg total) by mouth daily as needed (allergies). 90 tablet 3   Olopatadine-Mometasone (RYALTRIS) 665-25 MCG/ACT SUSP Place 2 sprays into the nose 2 (two) times daily as needed. 29 g 5   topiramate (TOPAMAX) 25 MG tablet Take 25 mg by mouth daily.     traZODone (DESYREL) 50 MG tablet Take 75 mg by mouth at bedtime.     SUMAtriptan (IMITREX) 50 MG tablet  (Patient not taking: Reported on 05/04/2023)     No current facility-administered medications for this visit.   Allergies: Allergies  Allergen Reactions   Sulfa Antibiotics Anaphylaxis, Hives and Swelling   Sesame Oil Hives and Swelling   Shellfish Allergy Hives and Swelling   Rosuvastatin Other (See Comments)    Cramps   I reviewed her past medical history, social history, family history, and environmental history and no significant changes have been reported from previous visit  on 04/01/2023.   Objective: Physical Exam Not obtained as encounter was done via telephone.   Previous notes and tests were reviewed.  I discussed the assessment and treatment plan with the patient. The patient was provided an opportunity to ask questions and all were answered. The patient agreed with the plan and demonstrated an understanding of the instructions.   The patient was advised to call back or seek an in-person evaluation if the symptoms worsen or if the condition fails to improve as anticipated.  I provided 21 minutes of non-face-to-face time during this encounter.  It was my pleasure to participate in Zena Marrazzo's care today. Please feel free to contact me with any questions or concerns.   Sincerely,  Thermon Leyland, FNP

## 2023-05-09 ENCOUNTER — Ambulatory Visit (INDEPENDENT_AMBULATORY_CARE_PROVIDER_SITE_OTHER): Payer: BC Managed Care – PPO

## 2023-05-09 DIAGNOSIS — J309 Allergic rhinitis, unspecified: Secondary | ICD-10-CM | POA: Diagnosis not present

## 2023-05-13 ENCOUNTER — Ambulatory Visit (INDEPENDENT_AMBULATORY_CARE_PROVIDER_SITE_OTHER): Payer: BC Managed Care – PPO

## 2023-05-13 DIAGNOSIS — J309 Allergic rhinitis, unspecified: Secondary | ICD-10-CM

## 2023-05-17 ENCOUNTER — Telehealth: Payer: Self-pay

## 2023-05-17 NOTE — Telephone Encounter (Signed)
Patient called requesting that her vials be transferred from the [place] office to the [place] office. She lives in [place] but she works in [place] and it would be easier to get to the [place] office for her allergy injections. Cree can you please bring patients vials to [facility] on Thursday.

## 2023-05-18 NOTE — Telephone Encounter (Signed)
Vials have been removed from [place] tray to transported to [facility] [facility].

## 2023-05-20 ENCOUNTER — Ambulatory Visit (INDEPENDENT_AMBULATORY_CARE_PROVIDER_SITE_OTHER): Payer: BC Managed Care – PPO | Admitting: *Deleted

## 2023-05-20 DIAGNOSIS — J309 Allergic rhinitis, unspecified: Secondary | ICD-10-CM | POA: Diagnosis not present

## 2023-05-25 ENCOUNTER — Ambulatory Visit (INDEPENDENT_AMBULATORY_CARE_PROVIDER_SITE_OTHER): Payer: BC Managed Care – PPO | Admitting: *Deleted

## 2023-05-25 DIAGNOSIS — J309 Allergic rhinitis, unspecified: Secondary | ICD-10-CM

## 2023-05-27 ENCOUNTER — Ambulatory Visit (INDEPENDENT_AMBULATORY_CARE_PROVIDER_SITE_OTHER): Payer: BC Managed Care – PPO

## 2023-05-27 DIAGNOSIS — J309 Allergic rhinitis, unspecified: Secondary | ICD-10-CM

## 2023-05-31 ENCOUNTER — Ambulatory Visit (INDEPENDENT_AMBULATORY_CARE_PROVIDER_SITE_OTHER): Payer: BC Managed Care – PPO

## 2023-05-31 DIAGNOSIS — F419 Anxiety disorder, unspecified: Secondary | ICD-10-CM | POA: Diagnosis not present

## 2023-05-31 DIAGNOSIS — J309 Allergic rhinitis, unspecified: Secondary | ICD-10-CM

## 2023-05-31 DIAGNOSIS — G43909 Migraine, unspecified, not intractable, without status migrainosus: Secondary | ICD-10-CM | POA: Diagnosis not present

## 2023-06-06 ENCOUNTER — Ambulatory Visit (INDEPENDENT_AMBULATORY_CARE_PROVIDER_SITE_OTHER): Payer: Self-pay | Admitting: *Deleted

## 2023-06-06 DIAGNOSIS — J309 Allergic rhinitis, unspecified: Secondary | ICD-10-CM

## 2023-06-09 ENCOUNTER — Ambulatory Visit (INDEPENDENT_AMBULATORY_CARE_PROVIDER_SITE_OTHER): Payer: BC Managed Care – PPO | Admitting: *Deleted

## 2023-06-09 DIAGNOSIS — J309 Allergic rhinitis, unspecified: Secondary | ICD-10-CM | POA: Diagnosis not present

## 2023-06-17 ENCOUNTER — Ambulatory Visit (INDEPENDENT_AMBULATORY_CARE_PROVIDER_SITE_OTHER): Payer: Self-pay | Admitting: *Deleted

## 2023-06-17 DIAGNOSIS — J309 Allergic rhinitis, unspecified: Secondary | ICD-10-CM | POA: Diagnosis not present

## 2023-06-30 ENCOUNTER — Telehealth: Payer: BC Managed Care – PPO | Admitting: Family Medicine

## 2023-06-30 DIAGNOSIS — J069 Acute upper respiratory infection, unspecified: Secondary | ICD-10-CM

## 2023-06-30 MED ORDER — PROMETHAZINE-DM 6.25-15 MG/5ML PO SYRP: 5.0000 mL | ORAL_SOLUTION | Freq: Four times a day (QID) | ORAL | 0 refills | Status: DC | PRN

## 2023-06-30 MED ORDER — FLUTICASONE PROPIONATE 50 MCG/ACT NA SUSP: 2.0000 | Freq: Every day | NASAL | 0 refills | Status: DC

## 2023-06-30 NOTE — Progress Notes (Signed)

## 2023-07-07 ENCOUNTER — Ambulatory Visit (INDEPENDENT_AMBULATORY_CARE_PROVIDER_SITE_OTHER): Payer: BC Managed Care – PPO | Admitting: *Deleted

## 2023-07-07 DIAGNOSIS — J309 Allergic rhinitis, unspecified: Secondary | ICD-10-CM

## 2023-07-11 ENCOUNTER — Ambulatory Visit (INDEPENDENT_AMBULATORY_CARE_PROVIDER_SITE_OTHER): Payer: Self-pay | Admitting: *Deleted

## 2023-07-11 DIAGNOSIS — J309 Allergic rhinitis, unspecified: Secondary | ICD-10-CM | POA: Diagnosis not present

## 2023-07-13 DIAGNOSIS — Z8639 Personal history of other endocrine, nutritional and metabolic disease: Secondary | ICD-10-CM | POA: Diagnosis not present

## 2023-07-13 DIAGNOSIS — A630 Anogenital (venereal) warts: Secondary | ICD-10-CM | POA: Diagnosis not present

## 2023-07-13 DIAGNOSIS — Z01419 Encounter for gynecological examination (general) (routine) without abnormal findings: Secondary | ICD-10-CM | POA: Diagnosis not present

## 2023-07-13 DIAGNOSIS — Z30431 Encounter for routine checking of intrauterine contraceptive device: Secondary | ICD-10-CM | POA: Diagnosis not present

## 2023-07-13 DIAGNOSIS — Z1329 Encounter for screening for other suspected endocrine disorder: Secondary | ICD-10-CM | POA: Diagnosis not present

## 2023-07-13 DIAGNOSIS — Z1151 Encounter for screening for human papillomavirus (HPV): Secondary | ICD-10-CM | POA: Diagnosis not present

## 2023-07-13 DIAGNOSIS — Z113 Encounter for screening for infections with a predominantly sexual mode of transmission: Secondary | ICD-10-CM | POA: Diagnosis not present

## 2023-07-13 DIAGNOSIS — R635 Abnormal weight gain: Secondary | ICD-10-CM | POA: Diagnosis not present

## 2023-07-26 DIAGNOSIS — Z30433 Encounter for removal and reinsertion of intrauterine contraceptive device: Secondary | ICD-10-CM | POA: Diagnosis not present

## 2023-08-01 ENCOUNTER — Ambulatory Visit (INDEPENDENT_AMBULATORY_CARE_PROVIDER_SITE_OTHER): Payer: Self-pay | Admitting: *Deleted

## 2023-08-01 DIAGNOSIS — J309 Allergic rhinitis, unspecified: Secondary | ICD-10-CM | POA: Diagnosis not present

## 2023-08-11 ENCOUNTER — Ambulatory Visit (INDEPENDENT_AMBULATORY_CARE_PROVIDER_SITE_OTHER): Payer: BC Managed Care – PPO

## 2023-08-11 DIAGNOSIS — J309 Allergic rhinitis, unspecified: Secondary | ICD-10-CM | POA: Diagnosis not present

## 2023-08-15 ENCOUNTER — Encounter: Payer: Self-pay | Admitting: Urgent Care

## 2023-08-15 ENCOUNTER — Ambulatory Visit (HOSPITAL_BASED_OUTPATIENT_CLINIC_OR_DEPARTMENT_OTHER)
Admission: RE | Admit: 2023-08-15 | Discharge: 2023-08-15 | Disposition: A | Discharge status: Home / Self Care | Payer: BC Managed Care – PPO | Source: Ambulatory Visit | Attending: Urgent Care | Admitting: Urgent Care

## 2023-08-15 ENCOUNTER — Ambulatory Visit
Admission: EM | Admit: 2023-08-15 | Discharge: 2023-08-15 | Disposition: A | Discharge status: Home / Self Care | Payer: BC Managed Care – PPO | Attending: Family Medicine | Admitting: Family Medicine

## 2023-08-15 DIAGNOSIS — S92355A Nondisplaced fracture of fifth metatarsal bone, left foot, initial encounter for closed fracture: Secondary | ICD-10-CM | POA: Diagnosis not present

## 2023-08-15 DIAGNOSIS — M79672 Pain in left foot: Secondary | ICD-10-CM

## 2023-08-15 DIAGNOSIS — S93602A Unspecified sprain of left foot, initial encounter: Secondary | ICD-10-CM

## 2023-08-15 MED ORDER — HYDROCODONE-ACETAMINOPHEN 5-325 MG PO TABS: 1.0000 | ORAL_TABLET | Freq: Four times a day (QID) | ORAL | 0 refills | Status: AC | PRN

## 2023-08-15 MED ORDER — NAPROXEN 500 MG PO TABS: 500.0000 mg | ORAL_TABLET | Freq: Two times a day (BID) | ORAL | 0 refills | Status: DC

## 2023-08-15 NOTE — ED Provider Notes (Signed)
 [facility]  Note:  This document was prepared using Conservation officer, historic buildings and may include unintentional dictation errors.  MRN: [id number] DOB: Nov 18, 1990  Subjective:   Renee English is a 33 y.o. female presenting for 2-day history of acute onset severe left foot pain with swelling.  Patient rolled her ankle laterally and has previously suffered a right foot fracture that feels the same way now in her left foot.  She does see a foot specialist.  Has not gotten any kind of relief from ibuprofen .  No current facility-administered medications for this encounter.  Current Outpatient Medications:    albuterol  (VENTOLIN  HFA) 108 (90 Base) MCG/ACT inhaler, Inhale 2 puffs into the lungs every 6 (six) hours as needed for wheezing or shortness of breath., Disp: 8 g, Rfl: 1   b complex vitamins capsule, Take 1 capsule by mouth every other day., Disp: , Rfl:    buPROPion (WELLBUTRIN XL) 150 MG 24 hr tablet, Take 150 mg by mouth daily., Disp: , Rfl:    Carbinoxamine  Maleate 4 MG TABS, Take one tablet once or twice a day to control hives or itch, Disp: 60 tablet, Rfl: 2   diclofenac  (VOLTAREN ) 50 MG EC tablet, Take 50 mg by mouth as needed., Disp: , Rfl:    EPINEPHrine  0.3 mg/0.3 mL IJ SOAJ injection, SMARTSIG:0.3 Milligram(s) IM Once, Disp: , Rfl:    ergocalciferol (VITAMIN D2) 1.25 MG (50000 UT) capsule, Take 50,000 Units by mouth once a week., Disp: , Rfl:    fluticasone  (FLONASE ) 50 MCG/ACT nasal spray, Place 2 sprays into both nostrils daily., Disp: 16 g, Rfl: 0   Fluticasone  Furoate (ARNUITY ELLIPTA ) 200 MCG/ACT AEPB, Inhale 1 puff into the lungs daily., Disp: 30 each, Rfl: 5   levonorgestrel  (MIRENA) 20 MCG/DAY IUD, 1 each by Intrauterine route once., Disp: , Rfl:    montelukast  (SINGULAIR ) 10 MG tablet, Take 1 tablet (10 mg total) by mouth daily as needed (allergies)., Disp: 90 tablet, Rfl: 3   Olopatadine-Mometasone (RYALTRIS ) 665-25 MCG/ACT SUSP, Place 2  sprays into the nose 2 (two) times daily as needed., Disp: 29 g, Rfl: 5   promethazine -dextromethorphan (PROMETHAZINE -DM) 6.25-15 MG/5ML syrup, Take 5 mLs by mouth 4 (four) times daily as needed for cough., Disp: 118 mL, Rfl: 0   SUMAtriptan (IMITREX) 50 MG tablet, , Disp: , Rfl:    topiramate (TOPAMAX) 25 MG tablet, Take 25 mg by mouth daily., Disp: , Rfl:    traZODone (DESYREL) 50 MG tablet, Take 75 mg by mouth at bedtime., Disp: , Rfl:    Allergies  Allergen Reactions   Sulfa Antibiotics Anaphylaxis, Hives and Swelling   Sesame Oil Hives and Swelling   Shellfish Allergy Hives and Swelling   Rosuvastatin Other (See Comments)    Cramps    Past Medical History:  Diagnosis Date   Anxiety    BV (bacterial vaginosis) 01/10/2013   Chlamydia    Contraceptive management 10/18/2013   COVID    January 2022 and February 2023 - states she was very sick both times, no hospitalization   Elevated liver enzymes 08/2021   Family history of adverse reaction to anesthesia    Mother is hard to wake up and father has experienced post op nausea and vomiting   History of kidney stones    Postcoital bleeding 01/10/2013   Seasonal allergies    UTI (urinary tract infection)    Vaginal irritation 10/18/2013     Past Surgical History:  Procedure Laterality  Date   FOOT ARTHRODESIS Right 03/03/2022   Procedure: MIDFOOT FUSION RIGHT FOOT;  Surgeon: Timothy Ford, MD;  Location: [facility] OR;  Service: Orthopedics;  Laterality: Right;   NO PAST SURGERIES     TONSILLECTOMY N/A 12/03/2022    Family History  Problem Relation Age of Onset   Asthma Mother    Allergic rhinitis Mother    Asthma Brother    Allergic rhinitis Brother    Allergic rhinitis Maternal Aunt    Cancer Maternal Aunt        breast   Diabetes Maternal Grandmother    Hypertension Maternal Grandmother    Cancer Maternal Grandmother        breast   Hypertension Maternal Grandfather    Diabetes Maternal Grandfather     Social History    Tobacco Use   Smoking status: Former    Current packs/day: 0.00    Types: Cigarettes    Quit date: 2020    Years since quitting: 5.1    Passive exposure: Current   Smokeless tobacco: Never  Vaping Use   Vaping status: Former  Substance Use Topics   Alcohol use: Yes    Comment: occasional   Drug use: No    Comment: pt former cocaine user - since 2015    ROS   Objective:   Vitals: BP (!) 142/84 (BP Location: Left Arm)   Pulse 84   Temp 98 F (36.7 C) (Oral)   Resp 16   SpO2 98%   Physical Exam Constitutional:      General: She is not in acute distress.    Appearance: Normal appearance. She is well-developed. She is not ill-appearing, toxic-appearing or diaphoretic.  HENT:     Head: Normocephalic and atraumatic.     Nose: Nose normal.     Mouth/Throat:     Mouth: Mucous membranes are moist.  Eyes:     General: No scleral icterus.       Right eye: No discharge.        Left eye: No discharge.     Extraocular Movements: Extraocular movements intact.  Cardiovascular:     Rate and Rhythm: Normal rate.  Pulmonary:     Effort: Pulmonary effort is normal.  Musculoskeletal:     Left ankle: No swelling, deformity, ecchymosis or lacerations. No tenderness. Normal range of motion. Anterior drawer test negative. Normal pulse.     Left Achilles Tendon: No tenderness or defects. Thompson's test negative.     Left foot: Decreased range of motion. Normal capillary refill. Swelling, tenderness, bony tenderness and crepitus present. No deformity or laceration.       Legs:  Skin:    General: Skin is warm and dry.  Neurological:     General: No focal deficit present.     Mental Status: She is alert and oriented to person, place, and time.  Psychiatric:        Mood and Affect: Mood normal.        Behavior: Behavior normal.    Patient placed into a short leg splint with the ankle flexed at 90 degrees.  Provided with crutches.   Assessment and Plan :   I have reviewed  the PDMP during this encounter.  1. Left foot pain   2. Foot sprain, left, initial encounter    High suspicion for fracture of the left fifth metatarsal.  Patient splinted as above.  Will pursue an outpatient x-ray given that I do not have a radiology technologist onsite.  Follow-up with her foot specialist as soon as possible.  Naproxen  for regular pain control, hydrocodone  for breakthrough pain.  Counseled patient on potential for adverse effects with medications prescribed/recommended today, ER and return-to-clinic precautions discussed, patient verbalized understanding.    Adolph Hoop, [place] 08/15/23 [phone]

## 2023-08-15 NOTE — ED Triage Notes (Signed)
 Pt reports foot pain and swelling in left foot x 2 days after she twisted. Ibuprofen  gives no relief.

## 2023-08-15 NOTE — Discharge Instructions (Addendum)
 Please schedule naproxen  twice daily with food for your severe pain.  If you still have pain despite taking naproxen  regularly, this is breakthrough pain.  You can use hydrocodone , a narcotic pain medicine, once every 4-6 hours for this.  Once your pain is better controlled, switch back to just naproxen .   Wear the splint at all times until I discussed your x-ray results with you.  Use crutches to move around.  Follow-up with Dr. Duda as soon as possible.

## 2023-08-16 ENCOUNTER — Other Ambulatory Visit: Payer: Self-pay

## 2023-08-17 ENCOUNTER — Ambulatory Visit: Payer: BC Managed Care – PPO | Admitting: Family

## 2023-08-17 ENCOUNTER — Encounter: Payer: Self-pay | Admitting: Family

## 2023-08-17 DIAGNOSIS — S92351A Displaced fracture of fifth metatarsal bone, right foot, initial encounter for closed fracture: Secondary | ICD-10-CM

## 2023-08-17 DIAGNOSIS — S92352A Displaced fracture of fifth metatarsal bone, left foot, initial encounter for closed fracture: Secondary | ICD-10-CM | POA: Diagnosis not present

## 2023-08-17 NOTE — Progress Notes (Signed)
Office Visit Note   Patient: Renee English           Date of Birth: 12-Feb-1991           MRN: [id number] Visit Date: 08/17/2023              Requested by: No referring provider defined for this encounter. PCP: Patient, No Pcp Per  No chief complaint on file.     HPI: The patient is a 33 year old woman who is seen for initial evaluation of left foot pain after twisting her foot when standing from a seated position she felt and heard a pop and had immediate onset of pain.  She was seen at urgent care for this injury, date of injury was February 8.  Radiographs in urgent care showed fracture to the base of the fifth metatarsal on the left without displacement.  Assessment & Plan: Visit Diagnoses: No diagnosis found.  Plan: Placed in a cam boot she will continue nonweightbearing.  She has a kneeling scooter she will follow-up in the office in 2 weeks with repeat radiographs of the foot  Follow-Up Instructions: No follow-ups on file.   Ortho Exam  Patient is alert, oriented, no adenopathy, well-dressed, normal affect, normal respiratory effort. On examination left foot there is moderate edema no erythema no ecchymosis she does have tenderness along the base of the fifth metatarsal.  Palpable dorsalis pedis pulse.  Imaging: No results found. No images are attached to the encounter.  Labs: Lab Results  Component Value Date   REPTSTATUS 10/15/2018 FINAL 10/14/2018   CULT  10/14/2018    NO GROWTH Performed at [facility] Lab, [address] [place], [place] [address]    LABORGA NO GROWTH 01/10/2013     Lab Results  Component Value Date   ALBUMIN 3.9 03/03/2022   ALBUMIN 4.6 08/20/2021   ALBUMIN 4.6 08/13/2021    No results found for: "MG" No results found for: "VD25OH"  No results found for: "PREALBUMIN"    Latest Ref Rng & Units 03/03/2022    9:25 AM 08/20/2021    2:45 PM 08/13/2021    5:38 PM  CBC EXTENDED  WBC 4.0 - 10.5 K/uL 6.2  7.4  10.2   RBC 3.87  - 5.11 MIL/uL 4.47  4.48  4.48   Hemoglobin 12.0 - 15.0 g/dL 19.1  47.8  29.5   HCT 36.0 - 46.0 % 41.2  42.3  42.4   Platelets 150 - 400 K/uL 288  284  320   NEUT# 1.7 - 7.7 K/uL  5.1    Lymph# 0.7 - 4.0 K/uL  1.7       There is no height or weight on file to calculate BMI.  Orders:  No orders of the defined types were placed in this encounter.  No orders of the defined types were placed in this encounter.    Procedures: No procedures performed  Clinical Data: No additional findings.  ROS:  All other systems negative, except as noted in the HPI. Review of Systems  Objective: Vital Signs: There were no vitals taken for this visit.  Specialty Comments:  No specialty comments available.  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   Seasonal and perennial allergic rhinitis 01/24/2023   Lisfranc dislocation, right, initial encounter    Alcohol abuse 11/07/2018  MDD (major depressive disorder), single episode, severe , no psychosis (HCC) 11/07/2018   Suicidal behavior 11/06/2018   Suicide attempt (HCC) 11/06/2018   Contraceptive management 10/18/2013   Vaginal irritation 10/18/2013   BV (bacterial vaginosis) 01/10/2013   Postcoital bleeding 01/10/2013   Past Medical History:  Diagnosis Date   Anxiety    BV (bacterial vaginosis) 01/10/2013   Chlamydia    Contraceptive management 10/18/2013   COVID    January 2022 and February 2023 - states she was very sick both times, no hospitalization   Elevated liver enzymes 08/2021   Family history of adverse reaction to anesthesia    Mother is hard to wake up and father has experienced post op nausea and vomiting   History of kidney stones    Postcoital bleeding 01/10/2013   Seasonal allergies    UTI (urinary tract infection)    Vaginal irritation  10/18/2013    Family History  Problem Relation Age of Onset   Asthma Mother    Allergic rhinitis Mother    Asthma Brother    Allergic rhinitis Brother    Allergic rhinitis Maternal Aunt    Cancer Maternal Aunt        breast   Diabetes Maternal Grandmother    Hypertension Maternal Grandmother    Cancer Maternal Grandmother        breast   Hypertension Maternal Grandfather    Diabetes Maternal Grandfather     Past Surgical History:  Procedure Laterality Date   FOOT ARTHRODESIS Right 03/03/2022   Procedure: MIDFOOT FUSION RIGHT FOOT;  Surgeon: Nadara Mustard, MD;  Location: [facility] OR;  Service: Orthopedics;  Laterality: Right;   NO PAST SURGERIES     TONSILLECTOMY N/A 12/03/2022   Social History   Occupational History   Not on file  Tobacco Use   Smoking status: Former    Current packs/day: 0.00    Types: Cigarettes    Quit date: 2020    Years since quitting: 5.1    Passive exposure: Current   Smokeless tobacco: Never  Vaping Use   Vaping status: Former  Substance and Sexual Activity   Alcohol use: Yes    Comment: occasional   Drug use: No    Comment: pt former cocaine user - since 2015   Sexual activity: Yes    Birth control/protection: Implant

## 2023-08-18 ENCOUNTER — Other Ambulatory Visit: Payer: Self-pay

## 2023-08-23 DIAGNOSIS — Z30431 Encounter for routine checking of intrauterine contraceptive device: Secondary | ICD-10-CM | POA: Diagnosis not present

## 2023-08-23 DIAGNOSIS — N9089 Other specified noninflammatory disorders of vulva and perineum: Secondary | ICD-10-CM | POA: Diagnosis not present

## 2023-08-23 DIAGNOSIS — L280 Lichen simplex chronicus: Secondary | ICD-10-CM | POA: Diagnosis not present

## 2023-09-02 ENCOUNTER — Other Ambulatory Visit (INDEPENDENT_AMBULATORY_CARE_PROVIDER_SITE_OTHER): Payer: Self-pay

## 2023-09-02 ENCOUNTER — Ambulatory Visit: Payer: BC Managed Care – PPO | Admitting: Family

## 2023-09-02 ENCOUNTER — Encounter: Payer: Self-pay | Admitting: Family

## 2023-09-02 DIAGNOSIS — M79672 Pain in left foot: Secondary | ICD-10-CM

## 2023-09-02 NOTE — Progress Notes (Signed)
 Office Visit Note   Patient: Renee English           Date of Birth: 11-14-1990           MRN: [id number] Visit Date: 09/02/2023              Requested by: No referring provider defined for this encounter. PCP: Patient, No Pcp Per  Chief Complaint  Patient presents with   Left Foot - Follow-up      HPI: The patient is a 33 year old woman who presents in follow-up for left wrist injury.  She has been nonweightbearing on a knee scooter and wearing a cam boot she has no concerns and feels things are going well with her foot  Assessment & Plan: Visit Diagnoses:  1. Pain in left foot     Plan: Radiographs reassuring.  She will advance to full weightbearing in the cam boot follow-up in about 3 weeks repeat radiographs at that time hope to advance her to regular shoewear  Follow-Up Instructions: Return in about 3 weeks (around 09/23/2023).   Ortho Exam  Patient is alert, oriented, no adenopathy, well-dressed, normal affect, normal respiratory effort. On examination left foot there is no edema no erythema no ecchymosis minimal tenderness to palpation to the base of the fifth metatarsal  Imaging: XR Foot Complete Left Result Date: 09/02/2023 Radiographs of the left foot show nondisplaced fracture of the base of the fifth metatarsal there is interval callus formation seen.  No widening  No images are attached to the encounter.  Labs: Lab Results  Component Value Date   REPTSTATUS 10/15/2018 FINAL 10/14/2018   CULT  10/14/2018    NO GROWTH Performed at [facility], [address], [place], [place] [address]    LABORGA NO GROWTH 01/10/2013     Lab Results  Component Value Date   ALBUMIN 3.9 03/03/2022   ALBUMIN 4.6 08/20/2021   ALBUMIN 4.6 08/13/2021    No results found for: "MG" No results found for: "VD25OH"  No results found for: "PREALBUMIN"    Latest Ref Rng & Units 03/03/2022    9:25 AM 08/20/2021    2:45 PM 08/13/2021    5:38 PM  CBC EXTENDED  WBC  4.0 - 10.5 K/uL 6.2  7.4  10.2   RBC 3.87 - 5.11 MIL/uL 4.47  4.48  4.48   Hemoglobin 12.0 - 15.0 g/dL 41.3  24.4  01.0   HCT 36.0 - 46.0 % 41.2  42.3  42.4   Platelets 150 - 400 K/uL 288  284  320   NEUT# 1.7 - 7.7 K/uL  5.1    Lymph# 0.7 - 4.0 K/uL  1.7       There is no height or weight on file to calculate BMI.  Orders:  Orders Placed This Encounter  Procedures   XR Foot Complete Left   No orders of the defined types were placed in this encounter.    Procedures: No procedures performed  Clinical Data: No additional findings.  ROS:  All other systems negative, except as noted in the HPI. Review of Systems  Objective: Vital Signs: There were no vitals taken for this visit.  Specialty Comments:  No specialty comments available.  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   Seasonal and perennial allergic rhinitis 01/24/2023   Lisfranc dislocation,  right, initial encounter    Alcohol abuse 11/07/2018   MDD (major depressive disorder), single episode, severe , no psychosis (HCC) 11/07/2018   Suicidal behavior 11/06/2018   Suicide attempt (HCC) 11/06/2018   Contraceptive management 10/18/2013   Vaginal irritation 10/18/2013   BV (bacterial vaginosis) 01/10/2013   Postcoital bleeding 01/10/2013   Past Medical History:  Diagnosis Date   Anxiety    BV (bacterial vaginosis) 01/10/2013   Chlamydia    Contraceptive management 10/18/2013   COVID    January 2022 and February 2023 - states she was very sick both times, no hospitalization   Elevated liver enzymes 08/2021   Family history of adverse reaction to anesthesia    Mother is hard to wake up and father has experienced post op nausea and vomiting   History of kidney stones    Postcoital bleeding 01/10/2013   Seasonal allergies    UTI  (urinary tract infection)    Vaginal irritation 10/18/2013    Family History  Problem Relation Age of Onset   Asthma Mother    Allergic rhinitis Mother    Asthma Brother    Allergic rhinitis Brother    Allergic rhinitis Maternal Aunt    Cancer Maternal Aunt        breast   Diabetes Maternal Grandmother    Hypertension Maternal Grandmother    Cancer Maternal Grandmother        breast   Hypertension Maternal Grandfather    Diabetes Maternal Grandfather     Past Surgical History:  Procedure Laterality Date   FOOT ARTHRODESIS Right 03/03/2022   Procedure: MIDFOOT FUSION RIGHT FOOT;  Surgeon: Nadara Mustard, MD;  Location: [facility] OR;  Service: Orthopedics;  Laterality: Right;   NO PAST SURGERIES     TONSILLECTOMY N/A 12/03/2022   Social History   Occupational History   Not on file  Tobacco Use   Smoking status: Former    Current packs/day: 0.00    Types: Cigarettes    Quit date: 2020    Years since quitting: 5.1    Passive exposure: Current   Smokeless tobacco: Never  Vaping Use   Vaping status: Former  Substance and Sexual Activity   Alcohol use: Yes    Comment: occasional   Drug use: No    Comment: pt former cocaine user - since 2015   Sexual activity: Yes    Birth control/protection: Implant

## 2023-09-07 ENCOUNTER — Ambulatory Visit (INDEPENDENT_AMBULATORY_CARE_PROVIDER_SITE_OTHER): Payer: Self-pay | Admitting: *Deleted

## 2023-09-07 DIAGNOSIS — J309 Allergic rhinitis, unspecified: Secondary | ICD-10-CM | POA: Diagnosis not present

## 2023-09-08 ENCOUNTER — Other Ambulatory Visit: Payer: Self-pay | Admitting: Allergy & Immunology

## 2023-09-13 ENCOUNTER — Other Ambulatory Visit (HOSPITAL_COMMUNITY)
Admission: RE | Admit: 2023-09-13 | Discharge: 2023-09-13 | Disposition: A | Discharge status: Home / Self Care | Source: Ambulatory Visit | Attending: Nurse Practitioner | Admitting: Nurse Practitioner

## 2023-09-13 DIAGNOSIS — L989 Disorder of the skin and subcutaneous tissue, unspecified: Secondary | ICD-10-CM | POA: Diagnosis not present

## 2023-09-13 DIAGNOSIS — Z30431 Encounter for routine checking of intrauterine contraceptive device: Secondary | ICD-10-CM | POA: Diagnosis not present

## 2023-09-13 DIAGNOSIS — N762 Acute vulvitis: Secondary | ICD-10-CM | POA: Diagnosis not present

## 2023-09-13 DIAGNOSIS — N9089 Other specified noninflammatory disorders of vulva and perineum: Secondary | ICD-10-CM | POA: Diagnosis not present

## 2023-09-16 LAB — SURGICAL PATHOLOGY

## 2023-09-23 ENCOUNTER — Ambulatory Visit: Payer: BC Managed Care – PPO | Admitting: Family

## 2023-09-23 ENCOUNTER — Other Ambulatory Visit (INDEPENDENT_AMBULATORY_CARE_PROVIDER_SITE_OTHER): Payer: Self-pay

## 2023-09-23 ENCOUNTER — Encounter: Payer: Self-pay | Admitting: Family

## 2023-09-23 ENCOUNTER — Ambulatory Visit (INDEPENDENT_AMBULATORY_CARE_PROVIDER_SITE_OTHER): Payer: Self-pay

## 2023-09-23 DIAGNOSIS — M79672 Pain in left foot: Secondary | ICD-10-CM

## 2023-09-23 DIAGNOSIS — J309 Allergic rhinitis, unspecified: Secondary | ICD-10-CM

## 2023-09-23 NOTE — Progress Notes (Signed)
 Post-Op Visit Note   Patient: Renee English           Date of Birth: [birth year]           MRN: [id number] Visit Date: 09/23/2023 PCP: Patient, No Pcp Per  Chief Complaint:  Chief Complaint  Patient presents with   Left Foot - Follow-up    HPI:  HPI The patient is a 33 year old woman who is seen in follow-up for base of the fifth metatarsal fracture on the left she has been in a cam walker for the last 6 weeks Ortho Exam On examination left foot there is no edema no erythema she has minimal tenderness to palpation of the base of the fifth metatarsal.  Visit Diagnoses:  1. Pain in left foot     Plan: May advance to regular shoewear.  Follow-up in 4 weeks if she fails to improve as expected  Follow-Up Instructions: No follow-ups on file.   Imaging: No results found.  Orders:  Orders Placed This Encounter  Procedures   XR Foot Complete Left   No orders of the defined types were placed in this encounter.    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   Seasonal and perennial allergic rhinitis 01/24/2023   Lisfranc dislocation, right, initial encounter    Alcohol abuse 11/07/2018   MDD (major depressive disorder), single episode, severe , no psychosis (HCC) 11/07/2018   Suicidal behavior 11/06/2018   Suicide attempt (HCC) 11/06/2018   Contraceptive management 10/18/2013   Vaginal irritation 10/18/2013   BV (bacterial vaginosis) 01/10/2013   Postcoital bleeding 01/10/2013   Past Medical History:  Diagnosis Date   Anxiety    BV (bacterial vaginosis) 01/10/2013   Chlamydia    Contraceptive management 10/18/2013   COVID    January 2022 and February 2023 - states she was very sick both times, no hospitalization   Elevated liver enzymes 08/2021   Family history of adverse reaction to anesthesia     Mother is hard to wake up and father has experienced post op nausea and vomiting   History of kidney stones    Postcoital bleeding 01/10/2013   Seasonal allergies    UTI (urinary tract infection)    Vaginal irritation 10/18/2013    Family History  Problem Relation Age of Onset   Asthma Mother    Allergic rhinitis Mother    Asthma Brother    Allergic rhinitis Brother    Allergic rhinitis Maternal Aunt    Cancer Maternal Aunt        breast   Diabetes Maternal Grandmother    Hypertension Maternal Grandmother    Cancer Maternal Grandmother        breast   Hypertension Maternal Grandfather    Diabetes Maternal Grandfather     Past Surgical History:  Procedure Laterality Date   FOOT ARTHRODESIS Right 03/03/2022   Procedure: MIDFOOT FUSION RIGHT FOOT;  Surgeon: Nadara Mustard, MD;  Location: [facility];  Service: Orthopedics;  Laterality: Right;   NO PAST SURGERIES     TONSILLECTOMY N/A 12/03/2022   Social History   Occupational History   Not on file  Tobacco Use   Smoking status: Former    Current packs/day: 0.00    Types: Cigarettes    Quit date: 2020    Years since quitting: 5.2    Passive  exposure: Current   Smokeless tobacco: Never  Vaping Use   Vaping status: Former  Substance and Sexual Activity   Alcohol use: Yes    Comment: occasional   Drug use: No    Comment: pt former cocaine user - since 2015   Sexual activity: Yes    Birth control/protection: Implant

## 2023-09-28 ENCOUNTER — Ambulatory Visit (INDEPENDENT_AMBULATORY_CARE_PROVIDER_SITE_OTHER): Payer: Self-pay | Admitting: *Deleted

## 2023-09-28 DIAGNOSIS — J309 Allergic rhinitis, unspecified: Secondary | ICD-10-CM

## 2023-10-04 ENCOUNTER — Ambulatory Visit (INDEPENDENT_AMBULATORY_CARE_PROVIDER_SITE_OTHER): Payer: Self-pay | Admitting: *Deleted

## 2023-10-04 DIAGNOSIS — J309 Allergic rhinitis, unspecified: Secondary | ICD-10-CM

## 2023-10-07 ENCOUNTER — Ambulatory Visit (INDEPENDENT_AMBULATORY_CARE_PROVIDER_SITE_OTHER): Admitting: *Deleted

## 2023-10-07 ENCOUNTER — Ambulatory Visit: Payer: BC Managed Care – PPO | Admitting: Allergy & Immunology

## 2023-10-07 DIAGNOSIS — J309 Allergic rhinitis, unspecified: Secondary | ICD-10-CM

## 2023-10-14 ENCOUNTER — Ambulatory Visit (INDEPENDENT_AMBULATORY_CARE_PROVIDER_SITE_OTHER)

## 2023-10-14 DIAGNOSIS — J309 Allergic rhinitis, unspecified: Secondary | ICD-10-CM | POA: Diagnosis not present

## 2023-10-17 ENCOUNTER — Encounter: Payer: Self-pay | Admitting: Internal Medicine

## 2023-10-17 ENCOUNTER — Ambulatory Visit: Admitting: Internal Medicine

## 2023-10-17 VITALS — BP 122/98 | HR 99 | Temp 98.4°F | Resp 16 | Wt 175.0 lb

## 2023-10-17 DIAGNOSIS — L508 Other urticaria: Secondary | ICD-10-CM

## 2023-10-17 DIAGNOSIS — T7802XD Anaphylactic reaction due to shellfish (crustaceans), subsequent encounter: Secondary | ICD-10-CM | POA: Diagnosis not present

## 2023-10-17 DIAGNOSIS — J3089 Other allergic rhinitis: Secondary | ICD-10-CM

## 2023-10-17 DIAGNOSIS — J4530 Mild persistent asthma, uncomplicated: Secondary | ICD-10-CM | POA: Diagnosis not present

## 2023-10-17 DIAGNOSIS — J302 Other seasonal allergic rhinitis: Secondary | ICD-10-CM

## 2023-10-17 MED ORDER — EPINEPHRINE 0.3 MG/0.3ML IJ SOAJ: 0.3000 mg | INTRAMUSCULAR | 1 refills | Status: DC | PRN

## 2023-10-17 MED ORDER — ALBUTEROL SULFATE HFA 108 (90 BASE) MCG/ACT IN AERS: 2.0000 | INHALATION_SPRAY | Freq: Four times a day (QID) | RESPIRATORY_TRACT | 1 refills | Status: DC | PRN

## 2023-10-17 MED ORDER — ARNUITY ELLIPTA 200 MCG/ACT IN AEPB: 1.0000 | INHALATION_SPRAY | Freq: Every day | RESPIRATORY_TRACT | 5 refills | Status: AC

## 2023-10-17 MED ORDER — CARBINOXAMINE MALEATE 4 MG PO TABS: 1.0000 | ORAL_TABLET | Freq: Two times a day (BID) | ORAL | 2 refills | Status: DC | PRN

## 2023-10-17 MED ORDER — MONTELUKAST SODIUM 10 MG PO TABS: 10.0000 mg | ORAL_TABLET | Freq: Every day | ORAL | 1 refills | Status: DC

## 2023-10-17 MED ORDER — RYALTRIS 665-25 MCG/ACT NA SUSP: 2.0000 | Freq: Two times a day (BID) | NASAL | 5 refills | Status: DC | PRN

## 2023-10-17 NOTE — Patient Instructions (Addendum)
 Asthma Continue Arnuity 200-1 puff once a day to prevent cough or wheeze Continue montelukast 10 mg once a day to prevent cough or wheeze Continue albuterol 2 puffs once every 4 hours as needed for cough or wheeze You may use albuterol 2 puffs 5 to 15 minutes before activity to decrease cough  Allergic rhinitis Continue allergen avoidance measures directed toward trees, grasses, weeds, cats, dogs cockroach, dust mite, and molds.  Continue carbinoxamine 4mg  once or twice a day.  Continue Ryaltris 2 sprays in each nostril up to twice a day as needed for nasal symptoms Consider saline nasal rinses as needed for nasal symptoms. Use this before any medicated nasal sprays for best result Continue allergen immunotherapy and have an epinephrine autoinjector set per protocol.   Urticaria - At this time etiology of hives and swelling is unknown. Hives can be caused by a variety of different triggers including illness/infection, pressure, vibrations, extremes of temperature to name a few however majority of the time there is no identifiable trigger.  - Continue carbinoxamine 4mg  once or twice a day.   Food allergy - Continue to avoid shellfish.   - for SKIN only reaction, okay to take Benadryl 25mg  capsules every 6 hours as needed - for SKIN + ANY additional symptoms, OR IF concern for LIFE THREATENING reaction = Epipen Autoinjector EpiPen 0.3 mg. - If using Epinephrine autoinjector, call 911 or go to the ER>

## 2023-10-17 NOTE — Progress Notes (Signed)
 FOLLOW UP Date of Service/Encounter:  10/17/23   Subjective:  Renee English (DOB: 1991-04-13) is a 33 y.o. female who [facility] for follow up for asthma, allergic rhinitis, urticaria and food allergies.   History obtained from: chart review and patient. Last visit was with Thermon Leyland on 05/04/2023  Asthma- Arnuity, Singulair AR- Karbinal, Ryaltris, AIT  Hives- PRN Lenor Derrick  Food allergy- avoiding shellfish   Since last visit, reports asthma is doing better.  Taking Arnuity daily, rarely needs Albuterol. No ER/urgent care or prednisone since last visit.  Does note some flare ups when allergies are uncontrolled.  On and off congestion, runny nose, drainage. On AIT.  Out of Ryaltris and needs refill on Karbinal which she uses almost daily.  No episodes of hives.  Avoiding shellfish, has Epipen. No accidental exposures.   Past Medical History: Past Medical History:  Diagnosis Date   Anxiety    BV (bacterial vaginosis) 01/10/2013   Chlamydia    Contraceptive management 10/18/2013   COVID    January 2022 and February 2023 - states she was very sick both times, no hospitalization   Elevated liver enzymes 08/2021   Family history of adverse reaction to anesthesia    Mother is hard to wake up and father has experienced post op nausea and vomiting   History of kidney stones    Postcoital bleeding 01/10/2013   Seasonal allergies    UTI (urinary tract infection)    Vaginal irritation 10/18/2013    Objective:  BP (!) 124/96   Pulse 99   Temp 98.4 F (36.9 C)   Resp 16   Wt 175 lb (79.4 kg)   SpO2 95%   BMI 31.40 kg/m  Body mass index is 31.4 kg/m. Physical Exam: GEN: alert, well developed HEENT: clear conjunctiva, nose with mild inferior turbinate hypertrophy, pink nasal mucosa, + clear rhinorrhea, + cobblestoning HEART: regular rate and rhythm, no murmur LUNGS: clear to auscultation bilaterally, no coughing, unlabored  respiration SKIN: no rashes or lesions  Spirometry:  Tracings reviewed. Her effort: Good reproducible efforts. FVC: 3.41L, 96% predicted  FEV1: 2.8L, 94% predicted FEV1/FVC ratio: 82% Interpretation: Spirometry consistent with normal pattern.   Assessment:   1. Seasonal and perennial allergic rhinitis   2. Chronic urticaria   3. Mild persistent asthma, uncomplicated   4. Anaphylactic shock due to shellfish, subsequent encounter     Plan/Recommendations:   Mild Persistent Asthma - Controlled, MDI technique discussed, Spirometry today is normal.  Continue Arnuity 200-1 puff once a day to prevent cough or wheeze Continue montelukast 10 mg once a day to prevent cough or wheeze Continue albuterol 2 puffs once every 4 hours as needed for cough or wheeze You may use albuterol 2 puffs 5 to 15 minutes before activity to decrease cough  Allergic rhinitis - Improved, continue building on AIT. Discussed importance of coming each week to help with buildup.  Continue allergen avoidance measures directed toward pollens, pets, cockroach, dust mite, and mold as listed below Continue carbinoxamine once or twice a day.  Continue Ryaltris 2 sprays in each nostril up to twice a day as needed for nasal symptoms Consider saline nasal rinses as needed for nasal symptoms. Use this before any medicated nasal sprays for best result Continue allergen immunotherapy and have an epinephrine autoinjector set per protocol.   Chronic Urticaria - No flares, controlled  - At this time etiology of hives and swelling is unknown. Hives can  be caused by a variety of different triggers including illness/infection, pressure, vibrations, extremes of temperature to name a few however majority of the time there is no identifiable trigger.  - Continue carbinoxamine once or twice a day.   Food allergy - Continue to avoid shellfish.   - Initial rxn: generalized rash, lip and tongue swelling  - SPT 03/2023: positive to  shellfish  - for SKIN only reaction, okay to take Benadryl 25mg  capsules every 6 hours as needed - for SKIN + ANY additional symptoms, OR IF concern for LIFE THREATENING reaction = Epipen Autoinjector EpiPen 0.3 mg. - If using Epinephrine autoinjector, call 911 or go to the ER>          Return in about 6 months (around 04/17/2024).  Kristen Petri, MD [facility] 

## 2023-10-20 ENCOUNTER — Ambulatory Visit (INDEPENDENT_AMBULATORY_CARE_PROVIDER_SITE_OTHER): Payer: Self-pay

## 2023-10-20 DIAGNOSIS — J309 Allergic rhinitis, unspecified: Secondary | ICD-10-CM

## 2023-10-24 ENCOUNTER — Ambulatory Visit (INDEPENDENT_AMBULATORY_CARE_PROVIDER_SITE_OTHER): Payer: Self-pay

## 2023-10-24 DIAGNOSIS — J309 Allergic rhinitis, unspecified: Secondary | ICD-10-CM | POA: Diagnosis not present

## 2023-10-26 ENCOUNTER — Ambulatory Visit (INDEPENDENT_AMBULATORY_CARE_PROVIDER_SITE_OTHER): Payer: Self-pay

## 2023-10-26 DIAGNOSIS — J309 Allergic rhinitis, unspecified: Secondary | ICD-10-CM | POA: Diagnosis not present

## 2023-10-31 ENCOUNTER — Ambulatory Visit (INDEPENDENT_AMBULATORY_CARE_PROVIDER_SITE_OTHER): Payer: Self-pay

## 2023-10-31 DIAGNOSIS — J309 Allergic rhinitis, unspecified: Secondary | ICD-10-CM

## 2023-11-04 ENCOUNTER — Ambulatory Visit (INDEPENDENT_AMBULATORY_CARE_PROVIDER_SITE_OTHER): Payer: Self-pay

## 2023-11-04 DIAGNOSIS — J309 Allergic rhinitis, unspecified: Secondary | ICD-10-CM

## 2023-11-09 ENCOUNTER — Ambulatory Visit (INDEPENDENT_AMBULATORY_CARE_PROVIDER_SITE_OTHER): Payer: Self-pay

## 2023-11-09 DIAGNOSIS — J309 Allergic rhinitis, unspecified: Secondary | ICD-10-CM

## 2023-11-15 ENCOUNTER — Ambulatory Visit (INDEPENDENT_AMBULATORY_CARE_PROVIDER_SITE_OTHER): Payer: Self-pay

## 2023-11-15 DIAGNOSIS — J309 Allergic rhinitis, unspecified: Secondary | ICD-10-CM

## 2023-11-17 ENCOUNTER — Ambulatory Visit (INDEPENDENT_AMBULATORY_CARE_PROVIDER_SITE_OTHER)

## 2023-11-17 DIAGNOSIS — J309 Allergic rhinitis, unspecified: Secondary | ICD-10-CM

## 2023-11-21 ENCOUNTER — Ambulatory Visit (INDEPENDENT_AMBULATORY_CARE_PROVIDER_SITE_OTHER): Payer: Self-pay

## 2023-11-21 DIAGNOSIS — J309 Allergic rhinitis, unspecified: Secondary | ICD-10-CM | POA: Diagnosis not present

## 2023-11-24 ENCOUNTER — Ambulatory Visit (INDEPENDENT_AMBULATORY_CARE_PROVIDER_SITE_OTHER)

## 2023-11-24 DIAGNOSIS — J309 Allergic rhinitis, unspecified: Secondary | ICD-10-CM

## 2023-11-30 ENCOUNTER — Ambulatory Visit (INDEPENDENT_AMBULATORY_CARE_PROVIDER_SITE_OTHER): Payer: Self-pay

## 2023-11-30 DIAGNOSIS — J309 Allergic rhinitis, unspecified: Secondary | ICD-10-CM

## 2023-12-02 ENCOUNTER — Ambulatory Visit (INDEPENDENT_AMBULATORY_CARE_PROVIDER_SITE_OTHER): Payer: Self-pay

## 2023-12-02 DIAGNOSIS — J309 Allergic rhinitis, unspecified: Secondary | ICD-10-CM | POA: Diagnosis not present

## 2023-12-06 ENCOUNTER — Ambulatory Visit (INDEPENDENT_AMBULATORY_CARE_PROVIDER_SITE_OTHER)

## 2023-12-06 DIAGNOSIS — J309 Allergic rhinitis, unspecified: Secondary | ICD-10-CM | POA: Diagnosis not present

## 2023-12-08 ENCOUNTER — Ambulatory Visit (INDEPENDENT_AMBULATORY_CARE_PROVIDER_SITE_OTHER): Payer: Self-pay

## 2023-12-08 DIAGNOSIS — J309 Allergic rhinitis, unspecified: Secondary | ICD-10-CM

## 2023-12-13 ENCOUNTER — Ambulatory Visit (INDEPENDENT_AMBULATORY_CARE_PROVIDER_SITE_OTHER): Payer: Self-pay

## 2023-12-13 DIAGNOSIS — J309 Allergic rhinitis, unspecified: Secondary | ICD-10-CM

## 2023-12-15 ENCOUNTER — Ambulatory Visit (INDEPENDENT_AMBULATORY_CARE_PROVIDER_SITE_OTHER): Payer: Self-pay

## 2023-12-15 DIAGNOSIS — J309 Allergic rhinitis, unspecified: Secondary | ICD-10-CM | POA: Diagnosis not present

## 2023-12-19 ENCOUNTER — Ambulatory Visit (INDEPENDENT_AMBULATORY_CARE_PROVIDER_SITE_OTHER): Payer: Self-pay

## 2023-12-19 DIAGNOSIS — J309 Allergic rhinitis, unspecified: Secondary | ICD-10-CM | POA: Diagnosis not present

## 2023-12-22 ENCOUNTER — Ambulatory Visit (INDEPENDENT_AMBULATORY_CARE_PROVIDER_SITE_OTHER): Payer: Self-pay

## 2023-12-22 DIAGNOSIS — J309 Allergic rhinitis, unspecified: Secondary | ICD-10-CM | POA: Diagnosis not present

## 2023-12-27 ENCOUNTER — Ambulatory Visit (INDEPENDENT_AMBULATORY_CARE_PROVIDER_SITE_OTHER)

## 2023-12-27 DIAGNOSIS — J309 Allergic rhinitis, unspecified: Secondary | ICD-10-CM

## 2023-12-29 ENCOUNTER — Ambulatory Visit

## 2023-12-29 DIAGNOSIS — J309 Allergic rhinitis, unspecified: Secondary | ICD-10-CM

## 2024-01-03 ENCOUNTER — Ambulatory Visit (INDEPENDENT_AMBULATORY_CARE_PROVIDER_SITE_OTHER)

## 2024-01-03 DIAGNOSIS — J309 Allergic rhinitis, unspecified: Secondary | ICD-10-CM

## 2024-01-05 ENCOUNTER — Ambulatory Visit (INDEPENDENT_AMBULATORY_CARE_PROVIDER_SITE_OTHER)

## 2024-01-05 DIAGNOSIS — J309 Allergic rhinitis, unspecified: Secondary | ICD-10-CM | POA: Diagnosis not present

## 2024-01-09 ENCOUNTER — Ambulatory Visit (INDEPENDENT_AMBULATORY_CARE_PROVIDER_SITE_OTHER)

## 2024-01-09 DIAGNOSIS — J309 Allergic rhinitis, unspecified: Secondary | ICD-10-CM

## 2024-01-12 DIAGNOSIS — L900 Lichen sclerosus et atrophicus: Secondary | ICD-10-CM | POA: Diagnosis not present

## 2024-01-12 DIAGNOSIS — A630 Anogenital (venereal) warts: Secondary | ICD-10-CM | POA: Diagnosis not present

## 2024-01-13 ENCOUNTER — Ambulatory Visit (INDEPENDENT_AMBULATORY_CARE_PROVIDER_SITE_OTHER)

## 2024-01-13 DIAGNOSIS — J309 Allergic rhinitis, unspecified: Secondary | ICD-10-CM | POA: Diagnosis not present

## 2024-01-17 ENCOUNTER — Ambulatory Visit (INDEPENDENT_AMBULATORY_CARE_PROVIDER_SITE_OTHER)

## 2024-01-17 DIAGNOSIS — J309 Allergic rhinitis, unspecified: Secondary | ICD-10-CM | POA: Diagnosis not present

## 2024-01-19 ENCOUNTER — Ambulatory Visit

## 2024-01-19 DIAGNOSIS — J309 Allergic rhinitis, unspecified: Secondary | ICD-10-CM | POA: Diagnosis not present

## 2024-01-24 ENCOUNTER — Ambulatory Visit (INDEPENDENT_AMBULATORY_CARE_PROVIDER_SITE_OTHER)

## 2024-01-24 DIAGNOSIS — J309 Allergic rhinitis, unspecified: Secondary | ICD-10-CM

## 2024-01-26 ENCOUNTER — Ambulatory Visit (INDEPENDENT_AMBULATORY_CARE_PROVIDER_SITE_OTHER)

## 2024-01-26 DIAGNOSIS — J309 Allergic rhinitis, unspecified: Secondary | ICD-10-CM | POA: Diagnosis not present

## 2024-01-30 ENCOUNTER — Ambulatory Visit (INDEPENDENT_AMBULATORY_CARE_PROVIDER_SITE_OTHER)

## 2024-01-30 DIAGNOSIS — J309 Allergic rhinitis, unspecified: Secondary | ICD-10-CM | POA: Diagnosis not present

## 2024-02-15 ENCOUNTER — Ambulatory Visit (INDEPENDENT_AMBULATORY_CARE_PROVIDER_SITE_OTHER)

## 2024-02-15 DIAGNOSIS — J309 Allergic rhinitis, unspecified: Secondary | ICD-10-CM | POA: Diagnosis not present

## 2024-02-20 ENCOUNTER — Ambulatory Visit (INDEPENDENT_AMBULATORY_CARE_PROVIDER_SITE_OTHER)

## 2024-02-20 DIAGNOSIS — J309 Allergic rhinitis, unspecified: Secondary | ICD-10-CM | POA: Diagnosis not present

## 2024-02-28 ENCOUNTER — Ambulatory Visit (INDEPENDENT_AMBULATORY_CARE_PROVIDER_SITE_OTHER)

## 2024-02-28 DIAGNOSIS — J309 Allergic rhinitis, unspecified: Secondary | ICD-10-CM

## 2024-03-14 ENCOUNTER — Ambulatory Visit (INDEPENDENT_AMBULATORY_CARE_PROVIDER_SITE_OTHER)

## 2024-03-14 DIAGNOSIS — J309 Allergic rhinitis, unspecified: Secondary | ICD-10-CM

## 2024-04-11 DIAGNOSIS — J302 Other seasonal allergic rhinitis: Secondary | ICD-10-CM | POA: Diagnosis not present

## 2024-04-11 DIAGNOSIS — J3089 Other allergic rhinitis: Secondary | ICD-10-CM | POA: Diagnosis not present

## 2024-04-11 DIAGNOSIS — J3081 Allergic rhinitis due to animal (cat) (dog) hair and dander: Secondary | ICD-10-CM | POA: Diagnosis not present

## 2024-04-11 DIAGNOSIS — J301 Allergic rhinitis due to pollen: Secondary | ICD-10-CM | POA: Diagnosis not present

## 2024-04-11 NOTE — Progress Notes (Signed)
 VIALS MADE 04-11-24

## 2024-04-25 ENCOUNTER — Other Ambulatory Visit: Payer: Self-pay | Admitting: Internal Medicine

## 2024-04-30 ENCOUNTER — Ambulatory Visit: Admitting: Internal Medicine

## 2024-04-30 ENCOUNTER — Encounter: Payer: Self-pay | Admitting: Internal Medicine

## 2024-04-30 VITALS — BP 130/94 | HR 88 | Temp 98.7°F | Wt 185.6 lb

## 2024-04-30 DIAGNOSIS — J3089 Other allergic rhinitis: Secondary | ICD-10-CM

## 2024-04-30 DIAGNOSIS — J302 Other seasonal allergic rhinitis: Secondary | ICD-10-CM

## 2024-04-30 DIAGNOSIS — L508 Other urticaria: Secondary | ICD-10-CM | POA: Diagnosis not present

## 2024-04-30 DIAGNOSIS — T7802XD Anaphylactic reaction due to shellfish (crustaceans), subsequent encounter: Secondary | ICD-10-CM

## 2024-04-30 DIAGNOSIS — J4530 Mild persistent asthma, uncomplicated: Secondary | ICD-10-CM | POA: Diagnosis not present

## 2024-04-30 MED ORDER — EPINEPHRINE 0.3 MG/0.3ML IJ SOAJ: 0.3000 mg | INTRAMUSCULAR | 1 refills | Status: AC | PRN

## 2024-04-30 MED ORDER — MONTELUKAST SODIUM 10 MG PO TABS
10.0000 mg | ORAL_TABLET | Freq: Every day | ORAL | 1 refills | Status: AC
Start: 2024-04-30 — End: ?

## 2024-04-30 MED ORDER — FLUTICASONE PROPIONATE 50 MCG/ACT NA SUSP: 2.0000 | Freq: Every day | NASAL | 5 refills | Status: AC

## 2024-04-30 MED ORDER — RYALTRIS 665-25 MCG/ACT NA SUSP: 2.0000 | Freq: Two times a day (BID) | NASAL | 5 refills | Status: AC | PRN

## 2024-04-30 MED ORDER — CARBINOXAMINE MALEATE 4 MG PO TABS: 1.0000 | ORAL_TABLET | Freq: Two times a day (BID) | ORAL | 5 refills | Status: AC | PRN

## 2024-04-30 MED ORDER — BUDESONIDE-FORMOTEROL FUMARATE 80-4.5 MCG/ACT IN AERO: 2.0000 | INHALATION_SPRAY | Freq: Two times a day (BID) | RESPIRATORY_TRACT | 5 refills | Status: AC

## 2024-04-30 MED ORDER — ALBUTEROL SULFATE HFA 108 (90 BASE) MCG/ACT IN AERS: 2.0000 | INHALATION_SPRAY | Freq: Four times a day (QID) | RESPIRATORY_TRACT | 1 refills | Status: DC | PRN

## 2024-04-30 NOTE — Progress Notes (Signed)
 FOLLOW UP Date of Service/Encounter:  04/30/24   Subjective:  Renee English (DOB: 10-18-1990) is a 33 y.o. female who [facility] for follow up for asthma, allergic rhinitis, urticaria and food allergies   History obtained from: chart review and patient. Last seen by me on 10/17/2023: Asthma- controlled on Arnuity AR- doing better on AIT, Also on Ryaltris  and Karbinal   CSU- Karbinal  BID PRN FA- avoids shellfish   Reports asthma has not been controlled with the change in weather.  The last few weeks, has been using Albuterol  multiple times a day due to dyspnea.  On Arnuity 200mcg 1 puff daily.  No ER/urgent care visits or oral prednisone  use since last visit.   Allergies are flared also.  Notes was doing a lot better on allergy shots but the last few shots, she felt very fatigued/tired.  Uses Karbinal  PRN and usually once on shot days which does sometimes help.  She has an Epipen , no allergic reactions to shots.  Uses Ryaltris  almost daily.  Avoids shellfish. No accidental exposure. Has an Epipen .  No issues with hives.   Past Medical History: Past Medical History:  Diagnosis Date   Anxiety    BV (bacterial vaginosis) 01/10/2013   Chlamydia    Contraceptive management 10/18/2013   COVID    January 2022 and February 2023 - states she was very sick both times, no hospitalization   Elevated liver enzymes 08/2021   Family history of adverse reaction to anesthesia    Mother is hard to wake up and father has experienced post op nausea and vomiting   History of kidney stones    Postcoital bleeding 01/10/2013   Seasonal allergies    UTI (urinary tract infection)    Vaginal irritation 10/18/2013    Objective:  BP (!) 130/94 (BP Location: Left Arm, Patient Position: Sitting, Cuff Size: Normal)   Pulse 88   Temp 98.7 F (37.1 C) (Temporal)   Wt 185 lb 9.6 oz (84.2 kg)   SpO2 99%   BMI 33.30 kg/m  Body mass index is 33.3  kg/m. Physical Exam: GEN: alert, well developed HEENT: clear conjunctiva, nose with mild inferior turbinate hypertrophy, pink nasal mucosa, clear rhinorrhea, + cobblestoning HEART: regular rate and rhythm, no murmur LUNGS: clear to auscultation bilaterally, no coughing, unlabored respiration SKIN: no rashes or lesions  Spirometry:  Tracings reviewed. Her effort: Good reproducible efforts. FVC: 3.22L, 91% predicted  FEV1: 2.77L, 93% predicted FEV1/FVC ratio: 86% Interpretation: Spirometry consistent with normal pattern.  Please see scanned spirometry results for details.  Assessment:   1. Chronic urticaria   2. Seasonal and perennial allergic rhinitis   3. Anaphylactic shock due to shellfish, subsequent encounter   4. Mild persistent asthma, uncomplicated     Plan/Recommendations:  Mild Persistent Asthma - MDI technique discussed.  Normal spirometry. Uncontrolled symptoms, will escalate therapy.   - Maintenance inhaler:  Start Symbicort 80-4.5mcg 2 puffs twice daily.  Stop Arnuity.   Continue Montelukast  10 mg once a day.  - Rescue inhaler: Albuterol  2 puffs via spacer or 1 vial via nebulizer every 4-6 hours as needed for respiratory symptoms of cough, shortness of breath, or wheezing Asthma control goals:  Full participation in all desired activities (may need albuterol  before activity) Albuterol  use two times or less a week on average (not counting use with activity) Cough interfering with sleep two times or less a month Oral steroids no more than once a year No  hospitalizations  Allergic rhinitis - Uncontrolled, restart AIT.  Discussed use of Karbinal  BID few days prior to, day of and day after shot.   - Continue allergen avoidance measures directed toward trees, grasses, weeds, cats, dogs cockroach, dust mite, and molds.  - Continue Carbinoxamine  4mg  twice daily around shot days and then can go to as needed.   - Continue Ryaltris  2 sprays each nostril twice daily as  needed for congestion, drainage, runny nose.  - Consider saline nasal rinses as needed for nasal symptoms. Use this before any medicated nasal sprays for best result - Continue allergen immunotherapy and have an epinephrine  autoinjector set per protocol. Initiated 04/2023. Red vial reached 01/2024.   Urticaria - Controlled  - At this time etiology of hives and swelling is unknown. Hives can be caused by a variety of different triggers including illness/infection, pressure, vibrations, extremes of temperature to name a few however majority of the time there is no identifiable trigger.  - Continue Carbinoxamine  4mg  once or twice a day.   Food allergy - Continue to avoid shellfish.   - Initial rxn: generalized rash, lip and tongue swelling  - for SKIN only reaction, okay to take Benadryl  25mg  every 6 hours as needed.  - for SKIN + ANY additional symptoms, OR IF concern for LIFE THREATENING reaction = Epipen  Autoinjector EpiPen  0.3 mg. - If using Epinephrine  autoinjector, call 911 or go to the ER>         Return in about 4 months (around 08/31/2024).  Arleta Blanch, MD [facility] 

## 2024-04-30 NOTE — Patient Instructions (Addendum)
 Mild Persistent Asthma - Maintenance inhaler:  Start Symbicort 80-4.5mcg 2 puffs twice daily.  Stop Arnuity.   Continue Montelukast  10 mg once a day.   - Rescue inhaler: Albuterol  2 puffs via spacer or 1 vial via nebulizer every 4-6 hours as needed for respiratory symptoms of cough, shortness of breath, or wheezing Asthma control goals:  Full participation in all desired activities (may need albuterol  before activity) Albuterol  use two times or less a week on average (not counting use with activity) Cough interfering with sleep two times or less a month Oral steroids no more than once a year No hospitalizations  Allergic rhinitis - Continue allergen avoidance measures directed toward trees, grasses, weeds, cats, dogs cockroach, dust mite, and molds.  - Continue Carbinoxamine  4mg  twice daily around shot days and then can go to as needed.   - Continue Ryaltris  2 sprays each nostril twice daily as needed for congestion, drainage, runny nose.  - Consider saline nasal rinses as needed for nasal symptoms. Use this before any medicated nasal sprays for best result - Continue allergen immunotherapy and have an epinephrine  autoinjector set per protocol. Initiated 04/2023. Red vial reached 01/2024.   Urticaria - At this time etiology of hives and swelling is unknown. Hives can be caused by a variety of different triggers including illness/infection, pressure, vibrations, extremes of temperature to name a few however majority of the time there is no identifiable trigger.  - Continue Carbinoxamine  4mg  once or twice a day.   Food allergy - Continue to avoid shellfish.   - for SKIN only reaction, okay to take Benadryl  25mg  every 6 hours as needed.  - for SKIN + ANY additional symptoms, OR IF concern for LIFE THREATENING reaction = Epipen  Autoinjector EpiPen  0.3 mg. - If using Epinephrine  autoinjector, call 911 or go to the ER>

## 2024-05-01 ENCOUNTER — Ambulatory Visit

## 2024-05-01 DIAGNOSIS — J309 Allergic rhinitis, unspecified: Secondary | ICD-10-CM

## 2024-05-07 ENCOUNTER — Encounter: Payer: Self-pay | Admitting: Radiology

## 2024-05-08 ENCOUNTER — Ambulatory Visit (INDEPENDENT_AMBULATORY_CARE_PROVIDER_SITE_OTHER)

## 2024-05-08 DIAGNOSIS — J309 Allergic rhinitis, unspecified: Secondary | ICD-10-CM | POA: Diagnosis not present

## 2024-05-14 ENCOUNTER — Ambulatory Visit

## 2024-05-14 DIAGNOSIS — J309 Allergic rhinitis, unspecified: Secondary | ICD-10-CM

## 2024-05-22 ENCOUNTER — Ambulatory Visit

## 2024-05-22 DIAGNOSIS — J309 Allergic rhinitis, unspecified: Secondary | ICD-10-CM | POA: Diagnosis not present

## 2024-05-29 ENCOUNTER — Ambulatory Visit (INDEPENDENT_AMBULATORY_CARE_PROVIDER_SITE_OTHER)

## 2024-05-29 DIAGNOSIS — J309 Allergic rhinitis, unspecified: Secondary | ICD-10-CM | POA: Diagnosis not present

## 2024-06-20 ENCOUNTER — Ambulatory Visit (INDEPENDENT_AMBULATORY_CARE_PROVIDER_SITE_OTHER)

## 2024-06-20 DIAGNOSIS — J309 Allergic rhinitis, unspecified: Secondary | ICD-10-CM | POA: Diagnosis not present

## 2024-07-03 ENCOUNTER — Ambulatory Visit (INDEPENDENT_AMBULATORY_CARE_PROVIDER_SITE_OTHER): Admitting: *Deleted

## 2024-07-03 DIAGNOSIS — J309 Allergic rhinitis, unspecified: Secondary | ICD-10-CM

## 2024-07-17 ENCOUNTER — Ambulatory Visit

## 2024-07-17 DIAGNOSIS — J302 Other seasonal allergic rhinitis: Secondary | ICD-10-CM | POA: Diagnosis not present

## 2024-07-22 ENCOUNTER — Emergency Department (HOSPITAL_COMMUNITY)

## 2024-07-22 ENCOUNTER — Observation Stay (HOSPITAL_COMMUNITY)
Admission: EM | Admit: 2024-07-22 | Discharge: 2024-07-22 | Disposition: A | Discharge status: Home / Self Care | Attending: Emergency Medicine | Admitting: Emergency Medicine

## 2024-07-22 ENCOUNTER — Other Ambulatory Visit: Payer: Self-pay

## 2024-07-22 DIAGNOSIS — E876 Hypokalemia: Secondary | ICD-10-CM | POA: Insufficient documentation

## 2024-07-22 DIAGNOSIS — E872 Acidosis, unspecified: Secondary | ICD-10-CM | POA: Insufficient documentation

## 2024-07-22 DIAGNOSIS — E669 Obesity, unspecified: Secondary | ICD-10-CM | POA: Diagnosis not present

## 2024-07-22 DIAGNOSIS — F419 Anxiety disorder, unspecified: Secondary | ICD-10-CM | POA: Insufficient documentation

## 2024-07-22 DIAGNOSIS — R079 Chest pain, unspecified: Secondary | ICD-10-CM | POA: Diagnosis not present

## 2024-07-22 DIAGNOSIS — Z87891 Personal history of nicotine dependence: Secondary | ICD-10-CM | POA: Insufficient documentation

## 2024-07-22 DIAGNOSIS — J189 Pneumonia, unspecified organism: Secondary | ICD-10-CM | POA: Diagnosis present

## 2024-07-22 DIAGNOSIS — T782XXA Anaphylactic shock, unspecified, initial encounter: Principal | ICD-10-CM

## 2024-07-22 DIAGNOSIS — T7802XA Anaphylactic reaction due to shellfish (crustaceans), initial encounter: Secondary | ICD-10-CM | POA: Diagnosis present

## 2024-07-22 LAB — CBC WITH DIFFERENTIAL/PLATELET
Abs Immature Granulocytes: 0.01 K/uL (ref 0.00–0.07)
Basophils Absolute: 0.1 K/uL (ref 0.0–0.1)
Basophils Relative: 1 %
Eosinophils Absolute: 0 K/uL (ref 0.0–0.5)
Eosinophils Relative: 0 %
HCT: 42.3 % (ref 36.0–46.0)
Hemoglobin: 14.3 g/dL (ref 12.0–15.0)
Immature Granulocytes: 0 %
Lymphocytes Relative: 41 %
Lymphs Abs: 3.3 K/uL (ref 0.7–4.0)
MCH: 30.2 pg (ref 26.0–34.0)
MCHC: 33.8 g/dL (ref 30.0–36.0)
MCV: 89.2 fL (ref 80.0–100.0)
Monocytes Absolute: 0.5 K/uL (ref 0.1–1.0)
Monocytes Relative: 6 %
Neutro Abs: 4.2 K/uL (ref 1.7–7.7)
Neutrophils Relative %: 52 %
Platelets: 376 K/uL (ref 150–400)
RBC: 4.74 MIL/uL (ref 3.87–5.11)
RDW: 11.8 % (ref 11.5–15.5)
WBC: 8 K/uL (ref 4.0–10.5)
nRBC: 0 % (ref 0.0–0.2)

## 2024-07-22 LAB — BASIC METABOLIC PANEL WITH GFR
Anion gap: 19 — ABNORMAL HIGH (ref 5–15)
BUN: 7 mg/dL (ref 6–20)
CO2: 21 mmol/L — ABNORMAL LOW (ref 22–32)
Calcium: 9.3 mg/dL (ref 8.9–10.3)
Chloride: 105 mmol/L (ref 98–111)
Creatinine, Ser: 0.86 mg/dL (ref 0.44–1.00)
GFR, Estimated: >60 mL/min
Glucose, Bld: 122 mg/dL — ABNORMAL HIGH (ref 70–99)
Potassium: 3 mmol/L — ABNORMAL LOW (ref 3.5–5.1)
Sodium: 145 mmol/L (ref 135–145)

## 2024-07-22 LAB — TROPONIN T, HIGH SENSITIVITY
Troponin T High Sensitivity: <15 ng/L (ref 0–19)
Troponin T High Sensitivity: <15 ng/L (ref 0–19)

## 2024-07-22 LAB — MAGNESIUM: Magnesium: 2.2 mg/dL (ref 1.7–2.4)

## 2024-07-22 LAB — PROCALCITONIN: Procalcitonin: 0.18 ng/mL

## 2024-07-22 LAB — HCG, SERUM, QUALITATIVE: Preg, Serum: NEGATIVE

## 2024-07-22 MED ORDER — AZITHROMYCIN 250 MG PO TABS
500.0000 mg | ORAL_TABLET | Freq: Once | ORAL | Status: AC
  Administered 2024-07-22: 500 mg via ORAL
  Filled 2024-07-22 (×2): qty 2

## 2024-07-22 MED ORDER — ACETAMINOPHEN 325 MG PO TABS: 650.0000 mg | ORAL_TABLET | Freq: Four times a day (QID) | ORAL | Status: DC | PRN

## 2024-07-22 MED ORDER — SODIUM CHLORIDE 0.9 % IV SOLN
1.0000 g | Freq: Once | INTRAVENOUS | Status: AC
  Administered 2024-07-22: 1 g via INTRAVENOUS
  Filled 2024-07-22: qty 10

## 2024-07-22 MED ORDER — EPINEPHRINE HCL 5 MG/250ML IV SOLN IN NS
0.5000 ug/min | INTRAVENOUS | Status: DC
  Administered 2024-07-22: 0.5 ug/min via INTRAVENOUS
  Filled 2024-07-22: qty 250

## 2024-07-22 MED ORDER — EPINEPHRINE 0.3 MG/0.3ML IJ SOAJ
0.3000 mg | Freq: Once | INTRAMUSCULAR | Status: AC
  Administered 2024-07-22: 0.3 mg via INTRAMUSCULAR

## 2024-07-22 MED ORDER — POTASSIUM CHLORIDE 10 MEQ/100ML IV SOLN
10.0000 meq | INTRAVENOUS | Status: AC
  Administered 2024-07-22 (×4): 10 meq via INTRAVENOUS
  Filled 2024-07-22 (×4): qty 100

## 2024-07-22 MED ORDER — LACTATED RINGERS IV SOLN: INTRAVENOUS | Status: DC

## 2024-07-22 MED ORDER — FENTANYL CITRATE (PF) 100 MCG/2ML IJ SOLN: 50.0000 ug | INTRAMUSCULAR | Status: DC | PRN

## 2024-07-22 MED ORDER — PROCHLORPERAZINE EDISYLATE 10 MG/2ML IJ SOLN
5.0000 mg | Freq: Four times a day (QID) | INTRAMUSCULAR | Status: DC | PRN
  Administered 2024-07-22: 5 mg via INTRAVENOUS
  Filled 2024-07-22: qty 2

## 2024-07-22 MED ORDER — GUAIFENESIN-DM 100-10 MG/5ML PO SYRP: 5.0000 mL | ORAL_SOLUTION | ORAL | Status: DC | PRN

## 2024-07-22 MED ORDER — SODIUM CHLORIDE 0.9 % IV BOLUS
2000.0000 mL | Freq: Once | INTRAVENOUS | Status: AC
  Administered 2024-07-22: 2000 mL via INTRAVENOUS

## 2024-07-22 MED ORDER — ALBUTEROL SULFATE HFA 108 (90 BASE) MCG/ACT IN AERS: 2.0000 | INHALATION_SPRAY | Freq: Four times a day (QID) | RESPIRATORY_TRACT | 1 refills | Status: AC | PRN

## 2024-07-22 MED ORDER — FLUTICASONE FUROATE-VILANTEROL 100-25 MCG/ACT IN AEPB
1.0000 | INHALATION_SPRAY | Freq: Every day | RESPIRATORY_TRACT | Status: DC
  Administered 2024-07-22: 1 via RESPIRATORY_TRACT
  Filled 2024-07-22: qty 28

## 2024-07-22 MED ORDER — IPRATROPIUM-ALBUTEROL 0.5-2.5 (3) MG/3ML IN SOLN: 3.0000 mL | RESPIRATORY_TRACT | Status: DC | PRN

## 2024-07-22 MED ORDER — IOHEXOL 350 MG/ML SOLN
75.0000 mL | Freq: Once | INTRAVENOUS | Status: AC | PRN
  Administered 2024-07-22: 75 mL via INTRAVENOUS

## 2024-07-22 MED ORDER — IPRATROPIUM-ALBUTEROL 0.5-2.5 (3) MG/3ML IN SOLN
3.0000 mL | Freq: Four times a day (QID) | RESPIRATORY_TRACT | Status: DC
  Administered 2024-07-22: 3 mL via RESPIRATORY_TRACT
  Filled 2024-07-22: qty 3

## 2024-07-22 MED ORDER — METHYLPREDNISOLONE SODIUM SUCC 125 MG IJ SOLR
125.0000 mg | Freq: Once | INTRAMUSCULAR | Status: AC
  Administered 2024-07-22: 125 mg via INTRAVENOUS
  Filled 2024-07-22: qty 2

## 2024-07-22 MED ORDER — FENTANYL CITRATE (PF) 100 MCG/2ML IJ SOLN: 100.0000 ug | INTRAMUSCULAR | Status: DC | PRN

## 2024-07-22 MED ORDER — ALBUTEROL SULFATE (2.5 MG/3ML) 0.083% IN NEBU
2.5000 mg | INHALATION_SOLUTION | Freq: Once | RESPIRATORY_TRACT | Status: AC
  Administered 2024-07-22: 2.5 mg via RESPIRATORY_TRACT
  Filled 2024-07-22: qty 3

## 2024-07-22 MED ORDER — POLYETHYLENE GLYCOL 3350 17 G PO PACK: 17.0000 g | PACK | Freq: Every day | ORAL | Status: DC | PRN

## 2024-07-22 MED ORDER — EPINEPHRINE 0.3 MG/0.3ML IJ SOAJ
0.3000 mg | Freq: Once | INTRAMUSCULAR | Status: AC
  Administered 2024-07-22: 0.3 mg via INTRAMUSCULAR
  Filled 2024-07-22: qty 0.3

## 2024-07-22 MED ORDER — FENTANYL CITRATE (PF) 100 MCG/2ML IJ SOLN
50.0000 ug | Freq: Once | INTRAMUSCULAR | Status: AC
  Administered 2024-07-22: 50 ug via INTRAVENOUS
  Filled 2024-07-22: qty 2

## 2024-07-22 MED ORDER — FAMOTIDINE IN NACL 20-0.9 MG/50ML-% IV SOLN
20.0000 mg | Freq: Once | INTRAVENOUS | Status: AC
  Administered 2024-07-22: 20 mg via INTRAVENOUS
  Filled 2024-07-22: qty 50

## 2024-07-22 MED ORDER — SODIUM CHLORIDE 0.9 % IV SOLN: 1.0000 g | INTRAVENOUS | Status: DC

## 2024-07-22 MED ORDER — AZITHROMYCIN 250 MG PO TABS: 500.0000 mg | ORAL_TABLET | Freq: Every day | ORAL | Status: DC

## 2024-07-22 MED ORDER — MELATONIN 3 MG PO TABS: 6.0000 mg | ORAL_TABLET | Freq: Every evening | ORAL | Status: DC | PRN

## 2024-07-22 MED ORDER — IPRATROPIUM-ALBUTEROL 0.5-2.5 (3) MG/3ML IN SOLN: 3.0000 mL | Freq: Four times a day (QID) | RESPIRATORY_TRACT | Status: DC

## 2024-07-22 MED ORDER — LORAZEPAM 2 MG/ML IJ SOLN
0.5000 mg | Freq: Once | INTRAMUSCULAR | Status: AC
  Administered 2024-07-22: 0.5 mg via INTRAVENOUS
  Filled 2024-07-22: qty 1

## 2024-07-22 NOTE — ED Provider Notes (Signed)
 " [place] EMERGENCY DEPARTMENT AT [facility] Provider Note   CSN: [id number] Arrival date & time: 07/22/24  9941     Patient presents with: Allergic Reaction   Renee English is a 34 y.o. female.    Allergic Reaction Presenting symptoms: difficulty swallowing and rash   Patient presents for chest and throat tightness.  She follows with allergy clinic for history of severe allergies.  She has had increased stresses regarding a relationship break-up.  Throughout the day today, she was with her family.  She was endorsing chest tightness throughout the day.  She does not think that she ate any known allergens.  She did go out to dinner with family but returned home seeming well.  She went to take a shower and, upon coming out of the shower, stated that her chest was heavy and that she was having a difficult time breathing.  EMS was called to her home.  She was treated for anaphylaxis with 50 mg of Benadryl , 2 intramuscular shots of epinephrine , and 2 breathing treatments.  On arrival, patient endorses ongoing chest tightness, throat tightness, pruritus of chest, shortness of breath.     Prior to Admission medications  Medication Sig Start Date End Date Taking? Authorizing Provider  albuterol  (VENTOLIN  HFA) 108 (90 Base) MCG/ACT inhaler Inhale 2 puffs into the lungs every 6 (six) hours as needed for wheezing or shortness of breath. 04/30/24   Tobie Arleta SQUIBB, MD  b complex vitamins capsule Take 1 capsule by mouth every other day.    [provider]  budesonide -formoterol  (SYMBICORT ) 80-4.5 MCG/ACT inhaler Inhale 2 puffs into the lungs in the morning and at bedtime. 04/30/24   Tobie Arleta SQUIBB, MD  buPROPion (WELLBUTRIN XL) 150 MG 24 hr tablet Take 150 mg by mouth daily. 02/17/21   [provider]  busPIRone (BUSPAR) 10 MG tablet 1 tablet Orally Once a day    [provider]  Carbinoxamine  Maleate 4 MG TABS Take 1 tablet (4 mg total) by mouth 2 (two) times  daily as needed (allergies or hives). 04/30/24   Tobie Arleta SQUIBB, MD  clobetasol ointment (TEMOVATE) 0.05 % Apply topically. 09/26/23   [provider]  diclofenac  (VOLTAREN ) 50 MG EC tablet Take 50 mg by mouth as needed. 12/02/22   [provider]  EPINEPHrine  0.3 mg/0.3 mL IJ SOAJ injection Inject 0.3 mg into the muscle as needed for anaphylaxis. 04/30/24   Tobie Arleta SQUIBB, MD  ergocalciferol (VITAMIN D2) 1.25 MG (50000 UT) capsule Take 50,000 Units by mouth once a week.    [provider]  fluticasone  (FLONASE ) 50 MCG/ACT nasal spray Place 2 sprays into both nostrils daily. 04/30/24   Tobie Arleta SQUIBB, MD  Fluticasone  Furoate (ARNUITY ELLIPTA ) 200 MCG/ACT AEPB Inhale 1 puff into the lungs daily. 10/17/23   Tobie Arleta SQUIBB, MD  HYDROcodone -acetaminophen  (NORCO/VICODIN) 5-325 MG tablet Take 1 tablet by mouth every 6 (six) hours as needed for severe pain (pain score 7-10). 08/15/23   Christopher Savannah, PA-C  levonorgestrel  (MIRENA) 20 MCG/DAY IUD 1 each by Intrauterine route once.    [provider]  montelukast  (SINGULAIR ) 10 MG tablet Take 1 tablet (10 mg total) by mouth at bedtime. 04/30/24   Tobie Arleta SQUIBB, MD  Olopatadine-Mometasone (RYALTRIS ) 665-25 MCG/ACT SUSP Place 2 sprays into the nose 2 (two) times daily as needed. 04/30/24   Tobie Arleta SQUIBB, MD  traZODone (DESYREL) 50 MG tablet Take 75 mg by mouth at bedtime. 04/17/21  [provider]    Allergies: Sulfa antibiotics, Sesame oil, Shellfish allergy, and Rosuvastatin    Review of Systems  HENT:  Positive for trouble swallowing.   Respiratory:  Positive for chest tightness and shortness of breath.   Cardiovascular:  Positive for chest pain.  Skin:  Positive for rash.  All other systems reviewed and are negative.   Updated Vital Signs BP (!) 112/54   Pulse (!) 112   Resp 18   SpO2 91%   Physical Exam Vitals and nursing note reviewed.  Constitutional:      General: She is not in acute distress.     Appearance: Normal appearance. She is well-developed. She is ill-appearing. She is not toxic-appearing or diaphoretic.  HENT:     Head: Normocephalic and atraumatic.     Right Ear: External ear normal.     Left Ear: External ear normal.     Nose: Nose normal.     Mouth/Throat:     Mouth: Mucous membranes are moist.  Eyes:     Extraocular Movements: Extraocular movements intact.     Conjunctiva/sclera: Conjunctivae normal.  Cardiovascular:     Rate and Rhythm: Normal rate and regular rhythm.  Pulmonary:     Effort: Tachypnea and accessory muscle usage present. No respiratory distress.     Breath sounds: Normal breath sounds. No wheezing, rhonchi or rales.  Abdominal:     General: There is no distension.     Palpations: Abdomen is soft.     Tenderness: There is no abdominal tenderness.  Musculoskeletal:        General: No swelling. Normal range of motion.     Cervical back: Normal range of motion and neck supple.  Skin:    General: Skin is warm and dry.     Findings: Rash present.  Neurological:     General: No focal deficit present.     Mental Status: She is alert and oriented to person, place, and time.  Psychiatric:        Mood and Affect: Mood is anxious.        Speech: Speech normal.        Behavior: Behavior is agitated. Behavior is cooperative.     (all labs ordered are listed, but only abnormal results are displayed) Labs Reviewed  BASIC METABOLIC PANEL WITH GFR - Abnormal; Notable for the following components:      Result Value   Potassium 3.0 (*)    CO2 21 (*)    Glucose, Bld 122 (*)    Anion gap 19 (*)    All other components within normal limits  CBC WITH DIFFERENTIAL/PLATELET  HCG, SERUM, QUALITATIVE  MAGNESIUM   TROPONIN T, HIGH SENSITIVITY  TROPONIN T, HIGH SENSITIVITY    EKG: EKG Interpretation Date/Time:  Sunday July 22 2024 02:02:24 EST Ventricular Rate:  118 PR Interval:  98 QRS Duration:  89 QT Interval:  381 QTC Calculation: 534 R  Axis:   69  Text Interpretation: Sinus tachycardia Prolonged QT interval Confirmed by Melvenia Motto ([id number]) on 07/22/2024 2:24:34 AM  Radiology: CT Angio Chest PE W and/or Wo Contrast Result Date: 07/22/2024 EXAM: CTA CHEST 07/22/2024 04:12:30 AM TECHNIQUE: CTA of the chest was performed after the administration of intravenous contrast. Multiplanar reformatted images are provided for review. MIP images are provided for review. Automated exposure control, iterative reconstruction, and/or weight based adjustment of the mA/kV was utilized to reduce the radiation dose to as low as reasonably achievable. COMPARISON: Comparison is made with  portable chest from today , PA chest 03/01/17, CT abdomen and pelvis with contrast 08/13/2021. [facility] CLINICAL HISTORY: Pulmonary embolism (PE) suspected, high prob. Shortness  Of breath. FINDINGS: PULMONARY ARTERIES: Pulmonary arteries are adequately opacified for evaluation. No acute pulmonary embolus. Main pulmonary artery is normal in caliber. MEDIASTINUM: There is mild cardiomegaly with left chamber predominance. The aorta and great vessels are normal. Pulmonary veins are nondilated. LYMPH NODES: No mediastinal, hilar or axillary lymphadenopathy. LUNGS AND PLEURA: There is a low inspiration with elevated right hemidiaphragm. There are posterior basilar opacities in both lower lobes which could all be due to atelectasis or could be a combination of atelectasis and pneumonia. The lungs are otherwise clear. There is no pleural effusion, thickening, or pneumothorax. UPPER ABDOMEN: In the abdomen, there is moderate steatosis of the liver with no acute findings. SOFT TISSUES AND BONES: No acute bone or soft tissue abnormality. IMPRESSION: 1. No pulmonary embolism is seen . 2. Posterior basilar opacities in both lower lobes, possibly due to atelectasis or a combination of atelectasis and pneumonia. 3. Mild cardiomegaly with left chamber predominance. Electronically signed by: Francis Quam MD  07/22/2024 05:14 AM EST RP Workstation: [id number]   DG Chest Port 1 View Result Date: 07/22/2024 EXAM: 1 VIEW XRAY OF THE CHEST 07/22/2024 01:50:02 AM COMPARISON: 03/01/2017 CLINICAL HISTORY: allergic reaction FINDINGS: LUNGS AND PLEURA: No focal pulmonary opacity. No pleural effusion. No pneumothorax. HEART AND MEDIASTINUM: No acute abnormality of the cardiac and mediastinal silhouettes. BONES AND SOFT TISSUES: No acute osseous abnormality. IMPRESSION: 1. No acute process. Electronically signed by: Oneil Devonshire MD 07/22/2024 01:58 AM EST RP Workstation: [id number]     Procedures   Medications Ordered in the ED  EPINEPHrine  (ADRENALIN ) 5 mg in NS 250 mL (0.02 mg/mL) premix infusion (0 mcg/min Intravenous Stopped 07/22/24 0322)  potassium chloride  10 mEq in 100 mL IVPB (10 mEq Intravenous New Bag/Given 07/22/24 0513)  fentaNYL  (SUBLIMAZE ) injection 50 mcg (has no administration in time range)  cefTRIAXone  (ROCEPHIN ) 1 g in sodium chloride  0.9 % 100 mL IVPB (has no administration in time range)  azithromycin  (ZITHROMAX ) tablet 500 mg (has no administration in time range)  EPINEPHrine  (EPI-PEN) injection 0.3 mg (0.3 mg Intramuscular Given 07/22/24 0059)  sodium chloride  0.9 % bolus 2,000 mL (0 mLs Intravenous Stopped 07/22/24 0209)  methylPREDNISolone  sodium succinate (SOLU-MEDROL ) 125 mg/2 mL injection 125 mg (125 mg Intravenous Given 07/22/24 0106)  famotidine  (PEPCID ) IVPB 20 mg premix (0 mg Intravenous Stopped 07/22/24 0148)  EPINEPHrine  (EPI-PEN) injection 0.3 mg (0.3 mg Intramuscular Given 07/22/24 0110)  albuterol  (PROVENTIL ) (2.5 MG/3ML) 0.083% nebulizer solution 2.5 mg (2.5 mg Nebulization Given 07/22/24 0124)  fentaNYL  (SUBLIMAZE ) injection 50 mcg (50 mcg Intravenous Given 07/22/24 0159)  albuterol  (PROVENTIL ) (2.5 MG/3ML) 0.083% nebulizer solution 2.5 mg (2.5 mg Nebulization Given 07/22/24 0331)  LORazepam  (ATIVAN ) injection 0.5 mg (0.5 mg Intravenous Given 07/22/24 0216)  iohexol  (OMNIPAQUE )  350 MG/ML injection 75 mL (75 mLs Intravenous Contrast Given 07/22/24 0403)                                    Medical Decision Making Amount and/or Complexity of Data Reviewed Labs: ordered. Radiology: ordered.  Risk Prescription drug management.   This patient presents to the ED for concern of chest tightness and shortness of breath, this involves an extensive number of treatment options, and is a complaint that carries with it a high risk of complications  and morbidity.  The differential diagnosis includes allergic reaction, anaphylaxis, anxiety, ACS, PE, pneumonia   Co morbidities / Chronic conditions that complicate the patient evaluation  Nephrolithiasis, depression, alcohol abuse   Additional history obtained:  Additional history obtained from EMR External records from outside source obtained and reviewed including EMS, patient's family   Lab Tests:  I Ordered, and personally interpreted labs.  The pertinent results include: Normal hemoglobin, no leukocytosis, normal kidney function.  Hypokalemia is present with otherwise normal electrolytes.  Troponin is normal.   Imaging Studies ordered:  I ordered imaging studies including chest x-ray, CTA chest I independently visualized and interpreted imaging which showed bibasilar opacities concerning for pneumonia and/or atelectasis I agree with the radiologist interpretation   Cardiac Monitoring: / EKG:  The patient was maintained on a cardiac monitor.  I personally viewed and interpreted the cardiac monitored which showed an underlying rhythm of: Sinus rhythm   Problem List / ED Course / Critical interventions / Medication management  Patient presenting with concern of severe allergic reaction.  She received 2 shots of intramuscular epinephrine , 50 mg of Benadryl , and 2 breathing treatments prior to arrival.  She arrives with ongoing breathing treatment going.  She is alert and oriented on arrival.  She does endorse  ongoing chest and throat tightness.  Her vital signs are notable for tachycardia and hypotension.  Ongoing treatment for anaphylaxis was initiated.  Patient received IV fluids, Solu-Medrol , Pepcid , as well as 2 more shots of intramuscular epinephrine .  Due to her ongoing symptoms after that, she was started on epinephrine  gtt.  Gtt. was increased to 2.5 mcg/min, at which time patient had improved symptoms.  She remained normotensive.  Lab work was initiated.  Initial lab work notable for hypokalemia.  Replacement potassium was ordered.  While in the ED, patient endorsed a sudden onset of chest pain, chest pressure.  Heart rate was elevated at this time but she remained in a normal sinus rhythm.  She was treated with fentanyl , Ativan , and additional breathing treatment.  At this point, I spoke with her mother who stated that she has had increased stress lately.  This may be contributing to her symptoms currently as well as throughout the day today.  Her severe chest discomfort did improve after fentanyl  and Ativan .  Her initial troponin is normal.  On further reassessment, patient is sleeping comfortably.  Patient underwent CTA of chest which did show concern of possible bibasilar pneumonia and/or atelectasis.  This may have been the cause of her chest heaviness throughout the day earlier today.  No other acute findings were identified.  Patient was off epinephrine  gtt. for several hours.  She remained tachycardic but hemodynamically stable.  SpO2 was in the low 90s on room air.  Patient to be admitted for further management. I ordered medication including IV fluids for hydration; Solu-Medrol , epinephrine , albuterol , Pepcid  for allergic reaction; potassium chloride  for hypokalemia; fentanyl  for analgesia; Ativan  for anxiolysis; ceftriaxone  and azithromycin  for empiric treatment of pneumonia Reevaluation of the patient after these medicines showed that the patient improved I have reviewed the patients home  medicines and have made adjustments as needed   Social Determinants of Health:  Lives independently  CRITICAL CARE Performed by: Bernardino Fireman   Total critical care time: 35 minutes  Critical care time was exclusive of separately billable procedures and treating other patients.  Critical care was necessary to treat or prevent imminent or life-threatening deterioration.  Critical care was time spent personally by me on  the following activities: development of treatment plan with patient and/or surrogate as well as nursing, discussions with consultants, evaluation of patient's response to treatment, examination of patient, obtaining history from patient or surrogate, ordering and performing treatments and interventions, ordering and review of laboratory studies, ordering and review of radiographic studies, pulse oximetry and re-evaluation of patient's condition.     Final diagnoses:  Anaphylaxis, initial encounter    ED Discharge Orders     None          Melvenia Motto, MD 07/22/24 0533  "

## 2024-07-22 NOTE — Plan of Care (Signed)
" °  Problem: Education: Goal: Knowledge of General Education information will improve Description: Including pain rating scale, medication(s)/side effects and non-pharmacologic comfort measures 07/22/2024 1032 by Mathews Norleen POUR, RN Outcome: Adequate for Discharge 07/22/2024 0756 by Mathews Norleen POUR, RN Outcome: Progressing   Problem: Health Behavior/Discharge Planning: Goal: Ability to manage health-related needs will improve 07/22/2024 1032 by Mathews Norleen POUR, RN Outcome: Adequate for Discharge 07/22/2024 0756 by Mathews Norleen POUR, RN Outcome: Progressing   Problem: Clinical Measurements: Goal: Ability to maintain clinical measurements within normal limits will improve 07/22/2024 1032 by Mathews Norleen POUR, RN Outcome: Adequate for Discharge 07/22/2024 0756 by Mathews Norleen POUR, RN Outcome: Progressing Goal: Will remain free from infection 07/22/2024 1032 by Mathews Norleen POUR, RN Outcome: Adequate for Discharge 07/22/2024 0756 by Mathews Norleen POUR, RN Outcome: Progressing Goal: Diagnostic test results will improve 07/22/2024 1032 by Mathews Norleen POUR, RN Outcome: Adequate for Discharge 07/22/2024 0756 by Mathews Norleen POUR, RN Outcome: Progressing Goal: Respiratory complications will improve 07/22/2024 1032 by Mathews Norleen POUR, RN Outcome: Adequate for Discharge 07/22/2024 0756 by Mathews Norleen POUR, RN Outcome: Progressing Goal: Cardiovascular complication will be avoided 07/22/2024 1032 by Mathews Norleen POUR, RN Outcome: Adequate for Discharge 07/22/2024 0756 by Mathews Norleen POUR, RN Outcome: Progressing   Problem: Activity: Goal: Risk for activity intolerance will decrease 07/22/2024 1032 by Mathews Norleen POUR, RN Outcome: Adequate for Discharge 07/22/2024 0756 by Mathews Norleen POUR, RN Outcome: Progressing   Problem: Nutrition: Goal: Adequate nutrition will be maintained 07/22/2024 1032 by Mathews Norleen POUR, RN Outcome: Adequate for Discharge 07/22/2024 0756 by Mathews Norleen POUR, RN Outcome: Progressing   Problem: Coping: Goal: Level of anxiety  will decrease 07/22/2024 1032 by Mathews Norleen POUR, RN Outcome: Adequate for Discharge 07/22/2024 0756 by Mathews Norleen POUR, RN Outcome: Progressing   Problem: Elimination: Goal: Will not experience complications related to bowel motility 07/22/2024 1032 by Mathews Norleen POUR, RN Outcome: Adequate for Discharge 07/22/2024 0756 by Mathews Norleen POUR, RN Outcome: Progressing Goal: Will not experience complications related to urinary retention 07/22/2024 1032 by Mathews Norleen POUR, RN Outcome: Adequate for Discharge 07/22/2024 0756 by Mathews Norleen POUR, RN Outcome: Progressing   Problem: Pain Managment: Goal: General experience of comfort will improve and/or be controlled 07/22/2024 1032 by Mathews Norleen POUR, RN Outcome: Adequate for Discharge 07/22/2024 0756 by Mathews Norleen POUR, RN Outcome: Progressing   Problem: Safety: Goal: Ability to remain free from injury will improve 07/22/2024 1032 by Mathews Norleen POUR, RN Outcome: Adequate for Discharge 07/22/2024 0756 by Mathews Norleen POUR, RN Outcome: Progressing   Problem: Skin Integrity: Goal: Risk for impaired skin integrity will decrease 07/22/2024 1032 by Mathews Norleen POUR, RN Outcome: Adequate for Discharge 07/22/2024 0756 by Mathews Norleen POUR, RN Outcome: Progressing   "

## 2024-07-22 NOTE — Discharge Instructions (Signed)

## 2024-07-22 NOTE — ED Notes (Signed)
 Hospitalist at bedside

## 2024-07-22 NOTE — ED Triage Notes (Addendum)
 Pt bib [facility] with possible allergic reaction, per EMS pt has known shellfish allergy and was at dinner tonight where they serve shellfish but pt states she did not come into contact with any shellfish that she is aware of. Upon EMS arrival pt was SOB, covered in hive and stating she felt like her throat was closing.   0.6 Epi IM, 50mg  Benadryl  IM and 2 albuertol treatments given PTA

## 2024-07-22 NOTE — ED Notes (Signed)
 Pt c/o severe chest tightness, states she cannot breathe. O2 98% on RA, HR 140s. EDP called to bedside

## 2024-07-22 NOTE — Discharge Summary (Signed)
 Physician Discharge Summary  Renee English FMW:[id number] DOB: 20-Jul-1990 DOA: 07/22/2024  PCP: Patient, No Pcp Per  Admit date: 07/22/2024  Discharge date: 07/22/2024  Admitted From:Home  Disposition:  Home  Recommendations for Outpatient Follow-up:  Follow up with PCP in 1-2 weeks Use inhaler as needed for sob/wheezing Follow up with allergist as recommended Continue other home meds as prior  Home Health:None  Equipment/Devices:None  Discharge Condition:Stable  CODE STATUS: Full  Diet recommendation: Regular diet  Brief/Interim Summary: Renee English is a 34 y.o. female with medical history significant for severe allergies, shellfish allergy with anaphylactic reaction, who initially presents to the ER due to concern for anaphylactic reaction.  The patient was at a dinner with family tonight where they serve shellfish.  The patient was treated for anaphylaxis with epinephrine  and Benadryl  as well as epinephrine  drip as well as steroids.  She was thought to have some early community-acquired pneumonia and was empirically started on Rocephin  and azithromycin , however this does not appear to be the case.  She is now in stable condition for discharge with no other acute events or concerns noted.  Discharge Diagnoses:  Principal Problem:   CAP (community acquired pneumonia)  Principal discharge diagnosis: Anaphylactic reaction with likely exposure to shellfish.  Discharge Instructions  Discharge Instructions     Increase activity slowly   Complete by: As directed       Allergies as of 07/22/2024       Reactions   Sulfa Antibiotics Anaphylaxis, Hives, Swelling   Sesame Oil Hives, Swelling   Shellfish Allergy Hives, Swelling   Rosuvastatin Other (See Comments)   Cramps        Medication List     TAKE these medications    albuterol  108 (90 Base) MCG/ACT inhaler Commonly known as: VENTOLIN  HFA Inhale 2 puffs into the lungs every 6 (six) hours as needed for  wheezing or shortness of breath.   Arnuity Ellipta  200 MCG/ACT Aepb Generic drug: Fluticasone  Furoate Inhale 1 puff into the lungs daily.   b complex vitamins capsule Take 1 capsule by mouth every other day.   budesonide -formoterol  80-4.5 MCG/ACT inhaler Commonly known as: Symbicort  Inhale 2 puffs into the lungs in the morning and at bedtime.   buPROPion 150 MG 24 hr tablet Commonly known as: WELLBUTRIN XL Take 150 mg by mouth daily.   busPIRone 10 MG tablet Commonly known as: BUSPAR 1 tablet Orally Once a day   Carbinoxamine  Maleate 4 MG Tabs Take 1 tablet (4 mg total) by mouth 2 (two) times daily as needed (allergies or hives).   clobetasol ointment 0.05 % Commonly known as: TEMOVATE Apply topically.   diclofenac  50 MG EC tablet Commonly known as: VOLTAREN  Take 50 mg by mouth as needed.   EPINEPHrine  0.3 mg/0.3 mL Soaj injection Commonly known as: EPI-PEN Inject 0.3 mg into the muscle as needed for anaphylaxis.   ergocalciferol 1.25 MG (50000 UT) capsule Commonly known as: VITAMIN D2 Take 50,000 Units by mouth once a week.   fluticasone  50 MCG/ACT nasal spray Commonly known as: FLONASE  Place 2 sprays into both nostrils daily.   HYDROcodone -acetaminophen  5-325 MG tablet Commonly known as: NORCO/VICODIN Take 1 tablet by mouth every 6 (six) hours as needed for severe pain (pain score 7-10).   levonorgestrel  20 MCG/DAY Iud Commonly known as: MIRENA 1 each by Intrauterine route once.   montelukast  10 MG tablet Commonly known as: SINGULAIR  Take 1 tablet (10 mg total) by mouth at bedtime.   Ryaltris   334-74 MCG/ACT Susp Generic drug: Olopatadine-Mometasone Place 2 sprays into the nose 2 (two) times daily as needed.   traZODone 50 MG tablet Commonly known as: DESYREL Take 75 mg by mouth at bedtime.        Allergies[1]  Consultations: None   Procedures/Studies: CT Angio Chest PE W and/or Wo Contrast Result Date: 07/22/2024 EXAM: CTA CHEST 07/22/2024  04:12:30 AM TECHNIQUE: CTA of the chest was performed after the administration of intravenous contrast. Multiplanar reformatted images are provided for review. MIP images are provided for review. Automated exposure control, iterative reconstruction, and/or weight based adjustment of the mA/kV was utilized to reduce the radiation dose to as low as reasonably achievable. COMPARISON: Comparison is made with portable chest from today , PA chest 03/01/17, CT abdomen and pelvis with contrast 08/13/2021. SABRA CLINICAL HISTORY: Pulmonary embolism (PE) suspected, high prob. Shortness  Of breath. FINDINGS: PULMONARY ARTERIES: Pulmonary arteries are adequately opacified for evaluation. No acute pulmonary embolus. Main pulmonary artery is normal in caliber. MEDIASTINUM: There is mild cardiomegaly with left chamber predominance. The aorta and great vessels are normal. Pulmonary veins are nondilated. LYMPH NODES: No mediastinal, hilar or axillary lymphadenopathy. LUNGS AND PLEURA: There is a low inspiration with elevated right hemidiaphragm. There are posterior basilar opacities in both lower lobes which could all be due to atelectasis or could be a combination of atelectasis and pneumonia. The lungs are otherwise clear. There is no pleural effusion, thickening, or pneumothorax. UPPER ABDOMEN: In the abdomen, there is moderate steatosis of the liver with no acute findings. SOFT TISSUES AND BONES: No acute bone or soft tissue abnormality. IMPRESSION: 1. No pulmonary embolism is seen . 2. Posterior basilar opacities in both lower lobes, possibly due to atelectasis or a combination of atelectasis and pneumonia. 3. Mild cardiomegaly with left chamber predominance. Electronically signed by: Francis Quam MD 07/22/2024 05:14 AM EST RP Workstation: [id number]   DG Chest Port 1 View Result Date: 07/22/2024 EXAM: 1 VIEW XRAY OF THE CHEST 07/22/2024 01:50:02 AM COMPARISON: 03/01/2017 CLINICAL HISTORY: allergic reaction FINDINGS: LUNGS AND  PLEURA: No focal pulmonary opacity. No pleural effusion. No pneumothorax. HEART AND MEDIASTINUM: No acute abnormality of the cardiac and mediastinal silhouettes. BONES AND SOFT TISSUES: No acute osseous abnormality. IMPRESSION: 1. No acute process. Electronically signed by: Oneil Devonshire MD 07/22/2024 01:58 AM EST RP Workstation: [id number]     Discharge Exam: Vitals:   07/22/24 0635 07/22/24 0820  BP: (!) 116/52   Pulse: (!) 118   Resp: 20   Temp: 99.2 F (37.3 C)   SpO2: 96% 97%   Vitals:   07/22/24 0600 07/22/24 0605 07/22/24 0635 07/22/24 0820  BP: (!) 111/55  (!) 116/52   Pulse: (!) 117 (!) 113 (!) 118   Resp: (!) 25 (!) 22 20   Temp:   99.2 F (37.3 C)   SpO2: 93% 93% 96% 97%  Height:   5' 3 (1.6 m)     General: Pt is alert, awake, not in acute distress Cardiovascular: RRR, S1/S2 +, no rubs, no gallops Respiratory: CTA bilaterally, no wheezing, no rhonchi Abdominal: Soft, NT, ND, bowel sounds + Extremities: no edema, no cyanosis    The results of significant diagnostics from this hospitalization (including imaging, microbiology, ancillary and laboratory) are listed below for reference.     Microbiology: No results found for this or any previous visit (from the past 240 hours).   Labs: BNP (last 3 results) No results for input(s): BNP in the last 8760 hours. Basic  Metabolic Panel: Recent Labs  Lab 07/22/24 0104  NA 145  K 3.0*  CL 105  CO2 21*  GLUCOSE 122*  BUN 7  CREATININE 0.86  CALCIUM 9.3  MG 2.2   Liver Function Tests: No results for input(s): AST, ALT, ALKPHOS, BILITOT, PROT, ALBUMIN in the last 168 hours. No results for input(s): LIPASE, AMYLASE in the last 168 hours. No results for input(s): AMMONIA in the last 168 hours. CBC: Recent Labs  Lab 07/22/24 0104  WBC 8.0  NEUTROABS 4.2  HGB 14.3  HCT 42.3  MCV 89.2  PLT 376   Cardiac Enzymes: No results for input(s): CKTOTAL, CKMB, CKMBINDEX, TROPONINI in the  last 168 hours. BNP: Invalid input(s): POCBNP CBG: No results for input(s): GLUCAP in the last 168 hours. D-Dimer No results for input(s): DDIMER in the last 72 hours. Hgb A1c No results for input(s): HGBA1C in the last 72 hours. Lipid Profile No results for input(s): CHOL, HDL, LDLCALC, TRIG, CHOLHDL, LDLDIRECT in the last 72 hours. Thyroid  function studies No results for input(s): TSH, T4TOTAL, T3FREE, THYROIDAB in the last 72 hours.  Invalid input(s): FREET3 Anemia work up No results for input(s): VITAMINB12, FOLATE, FERRITIN, TIBC, IRON, RETICCTPCT in the last 72 hours. Urinalysis    Component Value Date/Time   COLORURINE YELLOW 08/20/2021 1432   APPEARANCEUR CLEAR 08/20/2021 1432   LABSPEC 1.009 08/20/2021 1432   PHURINE 7.0 08/20/2021 1432   GLUCOSEU NEGATIVE 08/20/2021 1432   HGBUR MODERATE (A) 08/20/2021 1432   BILIRUBINUR NEGATIVE 08/20/2021 1432   KETONESUR NEGATIVE 08/20/2021 1432   PROTEINUR NEGATIVE 08/20/2021 1432   UROBILINOGEN 0.2 01/10/2013 1208   NITRITE NEGATIVE 08/20/2021 1432   LEUKOCYTESUR NEGATIVE 08/20/2021 1432   Sepsis Labs Recent Labs  Lab 07/22/24 0104  WBC 8.0   Microbiology No results found for this or any previous visit (from the past 240 hours).   Time coordinating discharge: 35 minutes  SIGNED:   Adron JONETTA Fairly, DO [facility] 07/22/2024, 10:16 AM  If 7PM-7AM, please contact night-coverage [email or web address]     [1]  Allergies Allergen Reactions   Sulfa Antibiotics Anaphylaxis, Hives and Swelling   Sesame Oil Hives and Swelling   Shellfish Allergy Hives and Swelling   Rosuvastatin Other (See Comments)    Cramps

## 2024-07-22 NOTE — Plan of Care (Signed)

## 2024-07-22 NOTE — ED Notes (Signed)
 Patient transported to CT

## 2024-07-22 NOTE — Progress Notes (Signed)
 Patient placed in OBSERVATION for CAP after presenting for anaphylactic rxn to shellfish. Inpatient Care Manager [facility]) completed brief admission assessment.  Patient with No PCP, Provider list added to AVS,  No additional discharge needs identified at this time. However, IPCM team will continue to follow along and monitor patient advancement through interdisciplinary progression rounds. If transition of care needs arise, please enter a ICM consult to prompt IPCM team to follow up.    07/22/24 1212  TOC Brief Assessment  Insurance and Status Reviewed  Patient has primary care physician Yes  Home environment has been reviewed Home with Self  Prior level of function: Independent  Prior/Current Home Services No current home services  Social Drivers of Health Review SDOH reviewed no interventions necessary  Readmission risk has been reviewed Yes  Transition of care needs no transition of care needs at this time

## 2024-07-22 NOTE — H&P (Addendum)
 " History and Physical  Renee English FMW:[id number] DOB: [birth year] DOA: 07/22/2024  Referring physician: Dr. Melvenia, EDP  PCP: Patient, No Pcp Per  Outpatient Specialists: Allergy clinic. Patient coming from: Home.  Chief Complaint: Anaphylactic reaction.  HPI: Renee English is a 34 y.o. female with medical history significant for severe allergies, shellfish allergy with anaphylactic reaction, who initially presents to the ER due to concern for anaphylactic reaction.  The patient was at a dinner with family tonight where they serve shellfish.  After dinner, she went home, and took a shower.  She is not aware of any direct contact with any known allergens.  However, after she got out of the shower, she developed shortness of breath and was covered with hive and felt like her throat was closing.  EMS was activated.  She received 0.6 IM epi, 50 mg IM Benadryl , and 2 albuterol  treatments en route via EMS.  In the ER, the patient endorses ongoing chest and throat tightness, pruritus of the chest and shortness of breath.  Has had a dry cough for a week.  Denies tobacco use or known exposure to sick contacts.  She was started on epinephrine  drip in the ER and received additional doses of albuterol  nebs.  Due to persistent chest pain in the ER, a CT angio chest was obtained.  This showed no pulmonary embolism.  It revealed bibasilar infiltrates concerning for pneumonia.  The patient was started on Rocephin  and azithromycin  for community-acquired pneumonia.  Received IV Ativan  0.5 mg x 1 for anxiety and IV Fentanyl  50 mcg x 1 for chest pain, in the ER.  TRH, hospitalist service, was asked to admit.  ED Course: BP 107/56, pulse 108, respiratory rate 19, O2 saturation 92% on room air.  Review of Systems: Review of systems as noted in the HPI. All other systems reviewed and are negative.   Past Medical History:  Diagnosis Date   Anxiety    BV (bacterial vaginosis) 01/10/2013   Chlamydia     Contraceptive management 10/18/2013   COVID    January 2022 and February 2023 - states she was very sick both times, no hospitalization   Elevated liver enzymes 08/2021   Family history of adverse reaction to anesthesia    Mother is hard to wake up and father has experienced post op nausea and vomiting   History of kidney stones    Postcoital bleeding 01/10/2013   Seasonal allergies    UTI (urinary tract infection)    Vaginal irritation 10/18/2013   Past Surgical History:  Procedure Laterality Date   FOOT ARTHRODESIS Right 03/03/2022   Procedure: MIDFOOT FUSION RIGHT FOOT;  Surgeon: Harden Jerona GAILS, MD;  Location: [facility] OR;  Service: Orthopedics;  Laterality: Right;   NO PAST SURGERIES     TONSILLECTOMY N/A 12/03/2022    Social History:  reports that she quit smoking about 6 years ago. Her smoking use included cigarettes. She smoked an average of 0.3 packs per day. She has been exposed to tobacco smoke. She has never used smokeless tobacco. She reports current alcohol use. She reports that she does not use drugs.   Allergies[1]  Family History  Problem Relation Age of Onset   Asthma Mother    Allergic rhinitis Mother    Asthma Brother    Allergic rhinitis Brother    Allergic rhinitis Maternal Aunt    Cancer Maternal Aunt        breast   Diabetes Maternal Grandmother    Hypertension  Maternal Grandmother    Cancer Maternal Grandmother        breast   Hypertension Maternal Grandfather    Diabetes Maternal Grandfather       Prior to Admission medications  Medication Sig Start Date End Date Taking? Authorizing Provider  albuterol  (VENTOLIN  HFA) 108 (90 Base) MCG/ACT inhaler Inhale 2 puffs into the lungs every 6 (six) hours as needed for wheezing or shortness of breath. 04/30/24   Tobie Arleta SQUIBB, MD  b complex vitamins capsule Take 1 capsule by mouth every other day.    [provider]  budesonide -formoterol  (SYMBICORT ) 80-4.5 MCG/ACT inhaler Inhale 2 puffs into the  lungs in the morning and at bedtime. 04/30/24   Tobie Arleta SQUIBB, MD  buPROPion (WELLBUTRIN XL) 150 MG 24 hr tablet Take 150 mg by mouth daily. 02/17/21   [provider]  busPIRone (BUSPAR) 10 MG tablet 1 tablet Orally Once a day    [provider]  Carbinoxamine  Maleate 4 MG TABS Take 1 tablet (4 mg total) by mouth 2 (two) times daily as needed (allergies or hives). 04/30/24   Tobie Arleta SQUIBB, MD  clobetasol ointment (TEMOVATE) 0.05 % Apply topically. 09/26/23   [provider]  diclofenac  (VOLTAREN ) 50 MG EC tablet Take 50 mg by mouth as needed. 12/02/22   [provider]  EPINEPHrine  0.3 mg/0.3 mL IJ SOAJ injection Inject 0.3 mg into the muscle as needed for anaphylaxis. 04/30/24   Tobie Arleta SQUIBB, MD  ergocalciferol (VITAMIN D2) 1.25 MG (50000 UT) capsule Take 50,000 Units by mouth once a week.    [provider]  fluticasone  (FLONASE ) 50 MCG/ACT nasal spray Place 2 sprays into both nostrils daily. 04/30/24   Tobie Arleta SQUIBB, MD  Fluticasone  Furoate (ARNUITY ELLIPTA ) 200 MCG/ACT AEPB Inhale 1 puff into the lungs daily. 10/17/23   Tobie Arleta SQUIBB, MD  HYDROcodone -acetaminophen  (NORCO/VICODIN) 5-325 MG tablet Take 1 tablet by mouth every 6 (six) hours as needed for severe pain (pain score 7-10). 08/15/23   Christopher Savannah, PA-C  levonorgestrel  (MIRENA) 20 MCG/DAY IUD 1 each by Intrauterine route once.    [provider]  montelukast  (SINGULAIR ) 10 MG tablet Take 1 tablet (10 mg total) by mouth at bedtime. 04/30/24   Tobie Arleta SQUIBB, MD  Olopatadine-Mometasone (RYALTRIS ) 665-25 MCG/ACT SUSP Place 2 sprays into the nose 2 (two) times daily as needed. 04/30/24   Tobie Arleta SQUIBB, MD  traZODone (DESYREL) 50 MG tablet Take 75 mg by mouth at bedtime. 04/17/21   [provider]    Physical Exam: BP (!) 107/56   Pulse (!) 108   Resp 19   SpO2 92%   General: 34 y.o. year-old female well developed well nourished in no acute distress.  Somnolent but easily  arouses and answers questions appropriately.. Cardiovascular: Tachycardic with no rubs or gallops.  No thyromegaly or JVD noted.  No lower extremity edema. 2/4 pulses in all 4 extremities. Respiratory: Very faint rales at bases.  Poor inspiratory effort. Abdomen: Soft nontender nondistended with normal bowel sounds x4 quadrants. Muskuloskeletal: No cyanosis, clubbing or edema noted bilaterally Neuro: CN II-XII intact, strength, sensation, reflexes Skin: No ulcerative lesions noted or rashes Psychiatry: Judgement and insight appear normal. Mood is appropriate for condition and setting          Labs on Admission:  Basic Metabolic Panel: Recent Labs  Lab 07/22/24 0104  NA 145  K 3.0*  CL 105  CO2 21*  GLUCOSE 122*  BUN 7  CREATININE 0.86  CALCIUM 9.3  MG 2.2   Liver Function Tests: No results for input(s): AST, ALT, ALKPHOS, BILITOT, PROT, ALBUMIN in the last 168 hours. No results for input(s): LIPASE, AMYLASE in the last 168 hours. No results for input(s): AMMONIA in the last 168 hours. CBC: Recent Labs  Lab 07/22/24 0104  WBC 8.0  NEUTROABS 4.2  HGB 14.3  HCT 42.3  MCV 89.2  PLT 376   Cardiac Enzymes: No results for input(s): CKTOTAL, CKMB, CKMBINDEX, TROPONINI in the last 168 hours.  BNP (last 3 results) No results for input(s): BNP in the last 8760 hours.  ProBNP (last 3 results) No results for input(s): PROBNP in the last 8760 hours.  CBG: No results for input(s): GLUCAP in the last 168 hours.  Radiological Exams on Admission: CT Angio Chest PE W and/or Wo Contrast Result Date: 07/22/2024 EXAM: CTA CHEST 07/22/2024 04:12:30 AM TECHNIQUE: CTA of the chest was performed after the administration of intravenous contrast. Multiplanar reformatted images are provided for review. MIP images are provided for review. Automated exposure control, iterative reconstruction, and/or weight based adjustment of the mA/kV was utilized to reduce  the radiation dose to as low as reasonably achievable. COMPARISON: Comparison is made with portable chest from today , PA chest 03/01/17, CT abdomen and pelvis with contrast 08/13/2021. SABRA CLINICAL HISTORY: Pulmonary embolism (PE) suspected, high prob. Shortness  Of breath. FINDINGS: PULMONARY ARTERIES: Pulmonary arteries are adequately opacified for evaluation. No acute pulmonary embolus. Main pulmonary artery is normal in caliber. MEDIASTINUM: There is mild cardiomegaly with left chamber predominance. The aorta and great vessels are normal. Pulmonary veins are nondilated. LYMPH NODES: No mediastinal, hilar or axillary lymphadenopathy. LUNGS AND PLEURA: There is a low inspiration with elevated right hemidiaphragm. There are posterior basilar opacities in both lower lobes which could all be due to atelectasis or could be a combination of atelectasis and pneumonia. The lungs are otherwise clear. There is no pleural effusion, thickening, or pneumothorax. UPPER ABDOMEN: In the abdomen, there is moderate steatosis of the liver with no acute findings. SOFT TISSUES AND BONES: No acute bone or soft tissue abnormality. IMPRESSION: 1. No pulmonary embolism is seen . 2. Posterior basilar opacities in both lower lobes, possibly due to atelectasis or a combination of atelectasis and pneumonia. 3. Mild cardiomegaly with left chamber predominance. Electronically signed by: Francis Quam MD 07/22/2024 05:14 AM EST RP Workstation: [id number]   DG Chest Port 1 View Result Date: 07/22/2024 EXAM: 1 VIEW XRAY OF THE CHEST 07/22/2024 01:50:02 AM COMPARISON: 03/01/2017 CLINICAL HISTORY: allergic reaction FINDINGS: LUNGS AND PLEURA: No focal pulmonary opacity. No pleural effusion. No pneumothorax. HEART AND MEDIASTINUM: No acute abnormality of the cardiac and mediastinal silhouettes. BONES AND SOFT TISSUES: No acute osseous abnormality. IMPRESSION: 1. No acute process. Electronically signed by: Oneil Devonshire MD 07/22/2024 01:58 AM EST RP  Workstation: [id number]    EKG: I independently viewed the EKG done and my findings are as followed: Sinus tachycardia 111.  QTc 446.  Assessment/Plan Present on Admission:  CAP (community acquired pneumonia)  Principal Problem:   CAP (community acquired pneumonia)  Early community-acquired pneumonia, POA Continue Rocephin  and IV azithromycin  DuoNeb every 6 hours and every 2 hours as needed As needed antitussives Incentive spirometer Maintain O2 saturation above 92%  Anaphylaxis reaction with possible exposure to shellfish Treated in the ER Received IM epinephrine  and IM Benadryl  Off epinephrine  drip for at least a few hours Monitor for now  Chest pain, less likely ACS, suspect musculoskeletal  12-lead EKG with no evidence of acute ischemia High-sensitivity troponin negative x 2  High anion gap metabolic acidosis suspect secondary to continuous albuterol  nebs Presented with serum bicarb of 21 with anion gap of 19 Currently off continuous neb Continue LR at 100 cc/h x 12 hours  Hypokalemia Serum potassium 3.0 Repleted intravenously Magnesium  level 2.2 Repeat BMP in the morning  History of anxiety Received 1 dose IV Ativan  0.5 mg in the ER  Obesity Weight 84 kg Recommend weight loss outpatient with regular physical activity and healthy dieting.   Time: 75 minutes.   DVT prophylaxis: SCDs.  Code Status: Full code.  Family Communication: None at bedside.  Disposition Plan: Admitted to telemetry unit.  Consults called: None.  Admission status: Observation status.   Status is: Observation    Terry LOISE Hurst MD [facility] Pager [phone]  If 7PM-7AM, please contact night-coverage [email or web address] Password [employer]  07/22/2024, 5:37 AM      [1]  Allergies Allergen Reactions   Sulfa Antibiotics Anaphylaxis, Hives and Swelling   Sesame Oil Hives and Swelling   Shellfish Allergy Hives and Swelling   Rosuvastatin Other (See Comments)     Cramps   "

## 2024-08-27 ENCOUNTER — Ambulatory Visit: Admitting: Family Medicine
